# Patient Record
Sex: Female | Born: 1940 | Race: White | Hispanic: No | Smoking: Never smoker
Health system: Southern US, Community
[De-identification: ages and names within clinical notes are randomized; demographics above are authoritative.]

## PROBLEM LIST (undated history)

## (undated) DIAGNOSIS — K219 Gastro-esophageal reflux disease without esophagitis: Secondary | ICD-10-CM

## (undated) DIAGNOSIS — N179 Acute kidney failure, unspecified: Secondary | ICD-10-CM

## (undated) DIAGNOSIS — E785 Hyperlipidemia, unspecified: Secondary | ICD-10-CM

## (undated) DIAGNOSIS — I10 Essential (primary) hypertension: Secondary | ICD-10-CM

## (undated) DIAGNOSIS — E119 Type 2 diabetes mellitus without complications: Secondary | ICD-10-CM

## (undated) HISTORY — DX: Type 2 diabetes mellitus without complications: E11.9

## (undated) HISTORY — DX: Essential (primary) hypertension: I10

## (undated) HISTORY — DX: Gastro-esophageal reflux disease without esophagitis: K21.9

## (undated) HISTORY — DX: Hyperlipidemia, unspecified: E78.5

## (undated) HISTORY — PX: TONSILLECTOMY AND ADENOIDECTOMY: SUR1326

---
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
## 2010-03-05 ENCOUNTER — Inpatient Hospital Stay (HOSPITAL_COMMUNITY): Admission: EM | Admit: 2010-03-05 | Discharge: 2010-03-08 | Payer: Self-pay | Admitting: Emergency Medicine

## 2010-03-29 ENCOUNTER — Ambulatory Visit: Payer: Self-pay | Admitting: Family Medicine

## 2010-04-20 ENCOUNTER — Ambulatory Visit: Payer: Self-pay | Admitting: Internal Medicine

## 2010-04-20 LAB — CONVERTED CEMR LAB
ALT: 19 units/L (ref 0–35)
AST: 13 units/L (ref 0–37)
Albumin: 4.7 g/dL (ref 3.5–5.2)
Alkaline Phosphatase: 106 units/L (ref 39–117)
BUN: 11 mg/dL (ref 6–23)
Basophils Absolute: 0 10*3/uL (ref 0.0–0.1)
Basophils Relative: 1 % (ref 0–1)
CO2: 26 meq/L (ref 19–32)
CRP: 0.2 mg/dL (ref <0.6)
Calcium: 9.7 mg/dL (ref 8.4–10.5)
Chloride: 101 meq/L (ref 96–112)
Creatinine, Ser: 0.59 mg/dL (ref 0.40–1.20)
Eosinophils Absolute: 0.1 10*3/uL (ref 0.0–0.7)
Eosinophils Relative: 1 % (ref 0–5)
Glucose, Bld: 170 mg/dL — ABNORMAL HIGH (ref 70–99)
HCT: 42.7 % (ref 36.0–46.0)
Hemoglobin: 14.3 g/dL (ref 12.0–15.0)
Hgb A1c MFr Bld: 8.3 % — ABNORMAL HIGH (ref <5.7)
Lymphocytes Relative: 36 % (ref 12–46)
Lymphs Abs: 3 10*3/uL (ref 0.7–4.0)
MCHC: 33.5 g/dL (ref 30.0–36.0)
MCV: 87.1 fL (ref 78.0–100.0)
Microalb, Ur: 1.06 mg/dL (ref 0.00–1.89)
Monocytes Absolute: 0.5 10*3/uL (ref 0.1–1.0)
Monocytes Relative: 6 % (ref 3–12)
Neutro Abs: 4.8 10*3/uL (ref 1.7–7.7)
Neutrophils Relative %: 57 % (ref 43–77)
Platelets: 225 10*3/uL (ref 150–400)
Potassium: 4.6 meq/L (ref 3.5–5.3)
RBC: 4.9 M/uL (ref 3.87–5.11)
RDW: 14.3 % (ref 11.5–15.5)
Sodium: 139 meq/L (ref 135–145)
Total Bilirubin: 0.5 mg/dL (ref 0.3–1.2)
Total Protein: 7.1 g/dL (ref 6.0–8.3)
Uric Acid, Serum: 4.1 mg/dL (ref 2.4–7.0)
WBC: 8.5 10*3/uL (ref 4.0–10.5)

## 2010-04-29 ENCOUNTER — Ambulatory Visit (HOSPITAL_COMMUNITY): Admission: RE | Admit: 2010-04-29 | Discharge: 2010-04-29 | Payer: Self-pay | Admitting: Internal Medicine

## 2010-05-04 ENCOUNTER — Encounter: Admission: RE | Admit: 2010-05-04 | Discharge: 2010-05-04 | Payer: Self-pay | Admitting: Internal Medicine

## 2010-05-25 ENCOUNTER — Ambulatory Visit: Payer: Self-pay | Admitting: Internal Medicine

## 2010-07-25 ENCOUNTER — Ambulatory Visit: Payer: Self-pay | Admitting: Internal Medicine

## 2010-07-25 LAB — CONVERTED CEMR LAB
Cholesterol: 241 mg/dL — ABNORMAL HIGH (ref 0–200)
HDL: 59 mg/dL (ref >39)
Hgb A1c MFr Bld: 6.6 % — ABNORMAL HIGH (ref <5.7)
LDL Cholesterol: 151 mg/dL — ABNORMAL HIGH (ref 0–99)
Total CHOL/HDL Ratio: 4.1
Triglycerides: 156 mg/dL — ABNORMAL HIGH (ref <150)
VLDL: 31 mg/dL (ref 0–40)

## 2011-02-22 LAB — CBC
HCT: 35.8 % — ABNORMAL LOW (ref 36.0–46.0)
HCT: 38.6 % (ref 36.0–46.0)
HCT: 45.6 % (ref 36.0–46.0)
Hemoglobin: 12.3 g/dL (ref 12.0–15.0)
Hemoglobin: 13.2 g/dL (ref 12.0–15.0)
Hemoglobin: 15.2 g/dL — ABNORMAL HIGH (ref 12.0–15.0)
MCHC: 33.3 g/dL (ref 30.0–36.0)
MCHC: 34.3 g/dL (ref 30.0–36.0)
MCHC: 34.3 g/dL (ref 30.0–36.0)
MCV: 87.9 fL (ref 78.0–100.0)
MCV: 88.1 fL (ref 78.0–100.0)
MCV: 89.5 fL (ref 78.0–100.0)
Platelets: 141 10*3/uL — ABNORMAL LOW (ref 150–400)
Platelets: 141 10*3/uL — ABNORMAL LOW (ref 150–400)
Platelets: 146 10*3/uL — ABNORMAL LOW (ref 150–400)
RBC: 4.07 MIL/uL (ref 3.87–5.11)
RBC: 4.39 MIL/uL (ref 3.87–5.11)
RBC: 5.09 MIL/uL (ref 3.87–5.11)
RDW: 12.9 % (ref 11.5–15.5)
RDW: 12.9 % (ref 11.5–15.5)
RDW: 13.3 % (ref 11.5–15.5)
WBC: 10.6 10*3/uL — ABNORMAL HIGH (ref 4.0–10.5)
WBC: 11.9 10*3/uL — ABNORMAL HIGH (ref 4.0–10.5)
WBC: 8.4 10*3/uL (ref 4.0–10.5)

## 2011-02-22 LAB — GLUCOSE, CAPILLARY
Glucose-Capillary: 134 mg/dL — ABNORMAL HIGH (ref 70–99)
Glucose-Capillary: 188 mg/dL — ABNORMAL HIGH (ref 70–99)
Glucose-Capillary: 189 mg/dL — ABNORMAL HIGH (ref 70–99)
Glucose-Capillary: 189 mg/dL — ABNORMAL HIGH (ref 70–99)
Glucose-Capillary: 203 mg/dL — ABNORMAL HIGH (ref 70–99)
Glucose-Capillary: 204 mg/dL — ABNORMAL HIGH (ref 70–99)
Glucose-Capillary: 218 mg/dL — ABNORMAL HIGH (ref 70–99)
Glucose-Capillary: 232 mg/dL — ABNORMAL HIGH (ref 70–99)
Glucose-Capillary: 247 mg/dL — ABNORMAL HIGH (ref 70–99)
Glucose-Capillary: 247 mg/dL — ABNORMAL HIGH (ref 70–99)
Glucose-Capillary: 249 mg/dL — ABNORMAL HIGH (ref 70–99)
Glucose-Capillary: 251 mg/dL — ABNORMAL HIGH (ref 70–99)
Glucose-Capillary: 251 mg/dL — ABNORMAL HIGH (ref 70–99)
Glucose-Capillary: 273 mg/dL — ABNORMAL HIGH (ref 70–99)
Glucose-Capillary: 494 mg/dL — ABNORMAL HIGH (ref 70–99)
Glucose-Capillary: 592 mg/dL (ref 70–99)
Glucose-Capillary: 97 mg/dL (ref 70–99)

## 2011-02-22 LAB — URINE MICROSCOPIC-ADD ON

## 2011-02-22 LAB — URINALYSIS, ROUTINE W REFLEX MICROSCOPIC
Bilirubin Urine: NEGATIVE
Glucose, UA: >1000 mg/dL — AB
Ketones, ur: 15 mg/dL — AB
Leukocytes, UA: NEGATIVE
Nitrite: NEGATIVE
Protein, ur: NEGATIVE mg/dL
Specific Gravity, Urine: 1.04 — ABNORMAL HIGH (ref 1.005–1.030)
Urobilinogen, UA: 1 mg/dL (ref 0.0–1.0)
pH: 5.5 (ref 5.0–8.0)

## 2011-02-22 LAB — URINE CULTURE
Colony Count: >=100000
Special Requests: NEGATIVE

## 2011-02-22 LAB — BLOOD GAS, VENOUS
Acid-Base Excess: 0.5 mmol/L (ref 0.0–2.0)
Bicarbonate: 23.7 mEq/L (ref 20.0–24.0)
Drawn by: 317371
O2 Saturation: 98 %
Patient temperature: 98.6
TCO2: 20.3 mmol/L (ref 0–100)
pCO2, Ven: 35.3 mmHg — ABNORMAL LOW (ref 45.0–50.0)
pH, Ven: 7.441 — ABNORMAL HIGH (ref 7.250–7.300)
pO2, Ven: 93.8 mmHg — ABNORMAL HIGH (ref 30.0–45.0)

## 2011-02-22 LAB — BASIC METABOLIC PANEL
BUN: 10 mg/dL (ref 6–23)
BUN: 20 mg/dL (ref 6–23)
BUN: 23 mg/dL (ref 6–23)
BUN: 6 mg/dL (ref 6–23)
CO2: 20 mEq/L (ref 19–32)
CO2: 24 mEq/L (ref 19–32)
CO2: 25 mEq/L (ref 19–32)
CO2: 26 mEq/L (ref 19–32)
Calcium: 7.7 mg/dL — ABNORMAL LOW (ref 8.4–10.5)
Calcium: 7.8 mg/dL — ABNORMAL LOW (ref 8.4–10.5)
Calcium: 8.1 mg/dL — ABNORMAL LOW (ref 8.4–10.5)
Calcium: 8.3 mg/dL — ABNORMAL LOW (ref 8.4–10.5)
Chloride: 105 mEq/L (ref 96–112)
Chloride: 105 mEq/L (ref 96–112)
Chloride: 108 mEq/L (ref 96–112)
Chloride: 95 mEq/L — ABNORMAL LOW (ref 96–112)
Creatinine, Ser: 0.46 mg/dL (ref 0.4–1.2)
Creatinine, Ser: 0.55 mg/dL (ref 0.4–1.2)
Creatinine, Ser: 0.56 mg/dL (ref 0.4–1.2)
Creatinine, Ser: 0.64 mg/dL (ref 0.4–1.2)
GFR calc Af Amer: >60 mL/min (ref >60)
GFR calc Af Amer: >60 mL/min (ref >60)
GFR calc Af Amer: >60 mL/min (ref >60)
GFR calc Af Amer: >60 mL/min (ref >60)
GFR calc non Af Amer: >60 mL/min (ref >60)
GFR calc non Af Amer: >60 mL/min (ref >60)
GFR calc non Af Amer: >60 mL/min (ref >60)
GFR calc non Af Amer: >60 mL/min (ref >60)
Glucose, Bld: 100 mg/dL — ABNORMAL HIGH (ref 70–99)
Glucose, Bld: 208 mg/dL — ABNORMAL HIGH (ref 70–99)
Glucose, Bld: 234 mg/dL — ABNORMAL HIGH (ref 70–99)
Glucose, Bld: 298 mg/dL — ABNORMAL HIGH (ref 70–99)
Potassium: 3.4 mEq/L — ABNORMAL LOW (ref 3.5–5.1)
Potassium: 3.7 mEq/L (ref 3.5–5.1)
Potassium: 4.4 mEq/L (ref 3.5–5.1)
Potassium: 4.5 mEq/L (ref 3.5–5.1)
Sodium: 130 mEq/L — ABNORMAL LOW (ref 135–145)
Sodium: 132 mEq/L — ABNORMAL LOW (ref 135–145)
Sodium: 135 mEq/L (ref 135–145)
Sodium: 135 mEq/L (ref 135–145)

## 2011-02-22 LAB — COMPREHENSIVE METABOLIC PANEL
ALT: 23 U/L (ref 0–35)
AST: 15 U/L (ref 0–37)
Albumin: 2.9 g/dL — ABNORMAL LOW (ref 3.5–5.2)
Alkaline Phosphatase: 135 U/L — ABNORMAL HIGH (ref 39–117)
BUN: 28 mg/dL — ABNORMAL HIGH (ref 6–23)
CO2: 22 mEq/L (ref 19–32)
Calcium: 9 mg/dL (ref 8.4–10.5)
Chloride: 91 mEq/L — ABNORMAL LOW (ref 96–112)
Creatinine, Ser: 0.78 mg/dL (ref 0.4–1.2)
GFR calc Af Amer: >60 mL/min (ref >60)
GFR calc non Af Amer: >60 mL/min (ref >60)
Glucose, Bld: 530 mg/dL — ABNORMAL HIGH (ref 70–99)
Potassium: 3.9 mEq/L (ref 3.5–5.1)
Sodium: 127 mEq/L — ABNORMAL LOW (ref 135–145)
Total Bilirubin: 0.8 mg/dL (ref 0.3–1.2)
Total Protein: 6.7 g/dL (ref 6.0–8.3)

## 2011-02-22 LAB — POCT CARDIAC MARKERS
CKMB, poc: <1 ng/mL — ABNORMAL LOW (ref 1.0–8.0)
Myoglobin, poc: 125 ng/mL (ref 12–200)
Troponin i, poc: <0.05 ng/mL (ref 0.00–0.09)

## 2011-02-22 LAB — DIFFERENTIAL
Basophils Absolute: 0.1 10*3/uL (ref 0.0–0.1)
Basophils Relative: 1 % (ref 0–1)
Eosinophils Absolute: 0 10*3/uL (ref 0.0–0.7)
Eosinophils Relative: 0 % (ref 0–5)
Lymphocytes Relative: 7 % — ABNORMAL LOW (ref 12–46)
Lymphs Abs: 0.9 10*3/uL (ref 0.7–4.0)
Monocytes Absolute: 0.8 10*3/uL (ref 0.1–1.0)
Monocytes Relative: 7 % (ref 3–12)
Neutro Abs: 10.1 10*3/uL — ABNORMAL HIGH (ref 1.7–7.7)
Neutrophils Relative %: 85 % — ABNORMAL HIGH (ref 43–77)

## 2011-02-22 LAB — LIPID PANEL
Cholesterol: 165 mg/dL (ref 0–200)
HDL: 20 mg/dL — ABNORMAL LOW (ref >39)
LDL Cholesterol: 98 mg/dL (ref 0–99)
Total CHOL/HDL Ratio: 8.3 RATIO
Triglycerides: 234 mg/dL — ABNORMAL HIGH (ref <150)
VLDL: 47 mg/dL — ABNORMAL HIGH (ref 0–40)

## 2011-02-22 LAB — HEMOGLOBIN A1C
Hgb A1c MFr Bld: 12.5 % — ABNORMAL HIGH (ref 4.6–6.1)
Mean Plasma Glucose: 312 mg/dL

## 2011-02-22 LAB — LIPASE, BLOOD: Lipase: 23 U/L (ref 11–59)

## 2011-02-22 LAB — KETONES, QUALITATIVE

## 2013-09-08 ENCOUNTER — Encounter: Payer: Self-pay | Admitting: Internal Medicine

## 2013-09-08 ENCOUNTER — Ambulatory Visit (INDEPENDENT_AMBULATORY_CARE_PROVIDER_SITE_OTHER): Payer: Medicare Other | Admitting: Internal Medicine

## 2013-09-08 ENCOUNTER — Other Ambulatory Visit (INDEPENDENT_AMBULATORY_CARE_PROVIDER_SITE_OTHER): Payer: Medicare Other

## 2013-09-08 VITALS — BP 140/78 | HR 74 | Temp 97.4°F | Ht 63.0 in | Wt 177.0 lb

## 2013-09-08 DIAGNOSIS — E785 Hyperlipidemia, unspecified: Secondary | ICD-10-CM

## 2013-09-08 DIAGNOSIS — E118 Type 2 diabetes mellitus with unspecified complications: Secondary | ICD-10-CM | POA: Insufficient documentation

## 2013-09-08 DIAGNOSIS — E119 Type 2 diabetes mellitus without complications: Secondary | ICD-10-CM

## 2013-09-08 DIAGNOSIS — I10 Essential (primary) hypertension: Secondary | ICD-10-CM

## 2013-09-08 LAB — COMPREHENSIVE METABOLIC PANEL
ALT: 12 U/L (ref 0–35)
AST: 15 U/L (ref 0–37)
Albumin: 4 g/dL (ref 3.5–5.2)
Alkaline Phosphatase: 75 U/L (ref 39–117)
BUN: 13 mg/dL (ref 6–23)
CO2: 25 mEq/L (ref 19–32)
Calcium: 9.2 mg/dL (ref 8.4–10.5)
Chloride: 100 mEq/L (ref 96–112)
Creatinine, Ser: 0.9 mg/dL (ref 0.4–1.2)
GFR: 65.62 mL/min (ref >60.00)
Glucose, Bld: 128 mg/dL — ABNORMAL HIGH (ref 70–99)
Potassium: 4.9 mEq/L (ref 3.5–5.1)
Sodium: 131 mEq/L — ABNORMAL LOW (ref 135–145)
Total Bilirubin: 0.3 mg/dL (ref 0.3–1.2)
Total Protein: 6.8 g/dL (ref 6.0–8.3)

## 2013-09-08 LAB — MICROALBUMIN / CREATININE URINE RATIO
Creatinine,U: 22.6 mg/dL
Microalb Creat Ratio: 4.4 mg/g (ref 0.0–30.0)
Microalb, Ur: 1 mg/dL (ref 0.0–1.9)

## 2013-09-08 LAB — CBC
HCT: 37.4 % (ref 36.0–46.0)
Hemoglobin: 12.7 g/dL (ref 12.0–15.0)
MCHC: 34 g/dL (ref 30.0–36.0)
MCV: 84.5 fl (ref 78.0–100.0)
Platelets: 203 10*3/uL (ref 150.0–400.0)
RBC: 4.43 Mil/uL (ref 3.87–5.11)
RDW: 13.6 % (ref 11.5–14.6)
WBC: 6.5 10*3/uL (ref 4.5–10.5)

## 2013-09-08 LAB — LIPID PANEL
Cholesterol: 186 mg/dL (ref 0–200)
HDL: 68.1 mg/dL (ref >39.00)
LDL Cholesterol: 97 mg/dL (ref 0–99)
Total CHOL/HDL Ratio: 3
Triglycerides: 105 mg/dL (ref 0.0–149.0)
VLDL: 21 mg/dL (ref 0.0–40.0)

## 2013-09-08 LAB — HEMOGLOBIN A1C: Hgb A1c MFr Bld: 6.4 % (ref 4.6–6.5)

## 2013-09-08 MED ORDER — GLUCOSE BLOOD VI STRP: ORAL_STRIP | Status: DC

## 2013-09-08 NOTE — Progress Notes (Signed)
HPI  Pt presents to the clinic today to establish care. She is transferring care from the [facility]. She has no concerns today.  Flu: 2014 at [facility] Tetanus: within the last 10 years Pap Smear: 2012 Mammogram: 2012 Colonoscopy: unsure Eye Doctor: as needed Dentist: as needed  History reviewed. No pertinent past medical history.  No current outpatient prescriptions on file.   No current facility-administered medications for this visit.    Not on File  Family History  Problem Relation Age of Onset  . Diabetes Mother   . Hyperlipidemia Mother   . Diabetes Father   . Diabetes Brother   . Diabetes Maternal Grandmother   . Diabetes Paternal Grandmother   . Cancer Neg Hx   . Stroke Neg Hx     History   Social History  . Marital Status: Widowed    Spouse Name: N/A    Number of Children: N/A  . Years of Education: N/A   Occupational History  . Not on file.   Social History Main Topics  . Smoking status: Never Smoker   . Smokeless tobacco: Never Used  . Alcohol Use: No  . Drug Use: No  . Sexual Activity: Yes    Birth Control/ Protection: Post-menopausal   Other Topics Concern  . Not on file   Social History Narrative  . No narrative on file    ROS:  Constitutional: Denies fever, malaise, fatigue, headache or abrupt weight changes.  HEENT: Denies eye pain, eye redness, ear pain, ringing in the ears, wax buildup, runny nose, nasal congestion, bloody nose, or sore throat. Respiratory: Denies difficulty breathing, shortness of breath, cough or sputum production.   Cardiovascular: Denies chest pain, chest tightness, palpitations or swelling in the hands or feet.  Gastrointestinal: Denies abdominal pain, bloating, constipation, diarrhea or blood in the stool.  GU: Denies frequency, urgency, pain with urination, blood in urine, odor or discharge. Musculoskeletal: Denies decrease in range of motion, difficulty with gait, muscle pain or joint pain and  swelling.  Skin: Denies redness, rashes, lesions or ulcercations.  Neurological: Denies dizziness, difficulty with memory, difficulty with speech or problems with balance and coordination.   No other specific complaints in a complete review of systems (except as listed in HPI above).  PE:  BP 140/78  Pulse 74  Temp(Src) 97.4 F (36.3 C) (Oral)  Ht 5\' 3"  (1.6 m)  Wt 177 lb (80.287 kg)  BMI 31.36 kg/m2  SpO2 98% Wt Readings from Last 3 Encounters:  09/08/13 177 lb (80.287 kg)    General: Appears her stated age, well developed, well nourished in NAD. HEENT: Head: normal shape and size; Eyes: sclera white, no icterus, conjunctiva pink, PERRLA and EOMs intact; Ears: Tm's gray and intact, normal light reflex; Nose: mucosa pink and moist, septum midline; Throat/Mouth: Teeth present, mucosa pink and moist, no lesions or ulcerations noted.  Neck: Normal range of motion. Neck supple, trachea midline. No massses, lumps or thyromegaly present.  Cardiovascular: Normal rate and rhythm. S1,S2 noted.  No murmur, rubs or gallops noted. No JVD or BLE edema. No carotid bruits noted. Pulmonary/Chest: Normal effort and positive vesicular breath sounds. No respiratory distress. No wheezes, rales or ronchi noted.  Abdomen: Soft and nontender. Normal bowel sounds, no bruits noted. No distention or masses noted. Liver, spleen and kidneys non palpable. Musculoskeletal: Normal range of motion. No signs of joint swelling. No difficulty with gait.  Neurological: Alert and oriented. Cranial nerves II-XII intact. Coordination normal. +DTRs bilaterally. Psychiatric:  Mood and affect normal. Behavior is normal. Judgment and thought content normal.     BMET    Component Value Date/Time   NA 139 04/20/2010 2032   K 4.6 04/20/2010 2032   CL 101 04/20/2010 2032   CO2 26 04/20/2010 2032   GLUCOSE 170* 04/20/2010 2032   BUN 11 04/20/2010 2032   CREATININE 0.59 04/20/2010 2032   CALCIUM 9.7 04/20/2010 2032   GFRNONAA >60  03/08/2010 0530   GFRAA  Value: >60        The eGFR has been calculated using the MDRD equation. This calculation has not been validated in all clinical situations. eGFR's persistently <60 mL/min signify possible Chronic Kidney Disease. 03/08/2010 0530    Lipid Panel     Component Value Date/Time   CHOL 241* 07/25/2010 1959   TRIG 156* 07/25/2010 1959   HDL 59 07/25/2010 1959   CHOLHDL 4.1 Ratio 07/25/2010 1959   VLDL 31 07/25/2010 1959   LDLCALC 151* 07/25/2010 1959    CBC    Component Value Date/Time   WBC 8.5 04/20/2010 2032   RBC 4.90 04/20/2010 2032   HGB 14.3 04/20/2010 2032   HCT 42.7 04/20/2010 2032   PLT 225 04/20/2010 2032   MCV 87.1 04/20/2010 2032   MCHC 33.5 04/20/2010 2032   RDW 14.3 04/20/2010 2032   LYMPHSABS 3.0 04/20/2010 2032   MONOABS 0.5 04/20/2010 2032   EOSABS 0.1 04/20/2010 2032   BASOSABS 0.0 04/20/2010 2032    Hgb A1C Lab Results  Component Value Date   HGBA1C 6.6* 07/25/2010     Assessment and Plan:  Health Maintenance:  Will have your records faxed over so that we can check your labs and immunization status  RTC in 6 moths or sooner if needed

## 2013-09-08 NOTE — Assessment & Plan Note (Signed)
Pt reports she does not take her cholesterol medication every day Only when she remembers Will recheck lipid profile today

## 2013-09-08 NOTE — Assessment & Plan Note (Signed)
Will check A1C and microalbumin Will refill meds and titrate as needed after labs reviewed

## 2013-09-08 NOTE — Patient Instructions (Signed)
Diabetes and Standards of Medical Care  Diabetes is complicated. You may find that your diabetes team includes a dietitian, nurse, diabetes educator, eye doctor, and more. To help everyone know what is going on and to help you get the care you deserve, the following schedule of care was developed to help keep you on track. Below are the tests, exams, vaccines, medicines, education, and plans you will need. A1c test  Performed at least 2 times a year if you are meeting treatment goals.  Performed 4 times a year if therapy has changed or if you are not meeting treatment goals. Blood pressure test  Performed at every routine medical visit. The goal is less than 120/80 mmHg. Dental exam  Follow up with the dentist regularly. Eye exam  Diagnosed with type 1 diabetes as a child: Get an exam upon reaching the age of 10 years or older and having had diabetes for 3 5 years. Yearly eye exams are recommended after that initial eye exam.  Diagnosed with type 1 diabetes as an adult: Get an exam within 5 years of diagnosis and then yearly.  Diagnosed with type 2 diabetes: Get an exam as soon as possible after the diagnosis and then yearly. Foot care exam  Visual foot exams are performed at every routine medical visit. The exams check for cuts, injuries, or other problems with the feet.  A comprehensive foot exam should be done yearly. This includes visual inspection as well as assessing foot pulses and testing for loss of sensation. Kidney function test (urine microalbumin)  Performed once a year.  Type 1 diabetes: The first test is performed 5 years after diagnosis.  Type 2 diabetes: The first test is performed at the time of diagnosis.  A serum creatinine and estimated glomerular filtration rate (eGFR) test is done once a year to tell the level of chronic kidney disease (CKD), if present. Lipid profile (Cholesterol, HDL, LDL, Triglycerides)  Performed every 5 years for most people.  The  goal for LDL is less than 100 mg/dl. If at high risk, the goal is less than 70 mg/dl.  The goal for HDL is 40 mg/dl 50 mg/dl for men and 50 mg/dl 60 mg/dl for women. An HDL cholesterol of 60 mg/dL or higher gives some protection against heart disease.  The goal for triglycerides is less than 150 mg/dl. Influenza vaccine, pneumococcal vaccine, and hepatitis B vaccine  The influenza vaccine is recommended yearly.  The pneumococcal vaccine is generally given once in a lifetime. However, there are some instances when another vaccination is recommended. Check with your caregiver.  The hepatitis B vaccine is also recommended for adults with diabetes. Diabetes self-management education  Recommended at diagnosis and ongoing as needed. Treatment plan  Reviewed at every medical visit. Document Released: 09/17/2009 Document Revised: 11/06/2012 Document Reviewed: 05/23/2011 [employer] Patient Information 2014 [employer].  

## 2013-09-08 NOTE — Assessment & Plan Note (Signed)
Elevated today Pt reports she does not take her meds everyday, only days that she can remember Will refill meds today

## 2013-09-10 MED ORDER — LISINOPRIL 20 MG PO TABS: 20.0000 mg | ORAL_TABLET | Freq: Every day | ORAL | Status: DC

## 2013-09-10 MED ORDER — METFORMIN HCL 850 MG PO TABS: 850.0000 mg | ORAL_TABLET | Freq: Two times a day (BID) | ORAL | Status: DC

## 2013-09-10 MED ORDER — INSULIN REGULAR HUMAN 100 UNIT/ML IJ SOLN: 2.0000 [IU] | Freq: Three times a day (TID) | INTRAMUSCULAR | Status: DC

## 2013-09-10 MED ORDER — GLUCOSE BLOOD VI STRP: ORAL_STRIP | Status: DC

## 2013-09-10 MED ORDER — PRAVASTATIN SODIUM 40 MG PO TABS: 40.0000 mg | ORAL_TABLET | Freq: Every day | ORAL | Status: DC

## 2013-09-10 MED ORDER — GABAPENTIN 100 MG PO CAPS: 100.0000 mg | ORAL_CAPSULE | Freq: Every day | ORAL | Status: DC

## 2013-09-10 NOTE — Addendum Note (Signed)
Addended by: Darnell Level on: 09/10/2013 11:44 AM   Modules accepted: Orders

## 2013-09-16 ENCOUNTER — Ambulatory Visit: Payer: Self-pay | Admitting: Internal Medicine

## 2014-02-02 ENCOUNTER — Other Ambulatory Visit: Payer: Self-pay

## 2014-02-02 DIAGNOSIS — E119 Type 2 diabetes mellitus without complications: Secondary | ICD-10-CM

## 2014-02-02 MED ORDER — METFORMIN HCL 850 MG PO TABS: 850.0000 mg | ORAL_TABLET | Freq: Two times a day (BID) | ORAL | Status: DC

## 2014-02-02 NOTE — Telephone Encounter (Signed)
Pt left v/m requesting refill metformin, pt is out of med; pt has new pt appt at [facility] 03/02/14.Please advise. Pt request cb today.

## 2014-02-27 ENCOUNTER — Telehealth: Payer: Self-pay

## 2014-03-02 ENCOUNTER — Ambulatory Visit: Payer: Medicare Other | Admitting: Physician Assistant

## 2014-03-02 NOTE — Telephone Encounter (Signed)
error 

## 2014-03-11 ENCOUNTER — Ambulatory Visit: Payer: Medicare Other | Admitting: Physician Assistant

## 2014-03-23 ENCOUNTER — Encounter: Payer: Self-pay | Admitting: Internal Medicine

## 2014-03-23 ENCOUNTER — Ambulatory Visit (INDEPENDENT_AMBULATORY_CARE_PROVIDER_SITE_OTHER): Payer: Medicare Other | Admitting: Internal Medicine

## 2014-03-23 ENCOUNTER — Other Ambulatory Visit (INDEPENDENT_AMBULATORY_CARE_PROVIDER_SITE_OTHER): Payer: Medicare Other

## 2014-03-23 VITALS — BP 168/82 | HR 86 | Temp 97.0°F | Resp 16 | Ht 63.0 in | Wt 174.5 lb

## 2014-03-23 DIAGNOSIS — E119 Type 2 diabetes mellitus without complications: Secondary | ICD-10-CM

## 2014-03-23 DIAGNOSIS — Z0000 Encounter for general adult medical examination without abnormal findings: Secondary | ICD-10-CM | POA: Insufficient documentation

## 2014-03-23 DIAGNOSIS — I10 Essential (primary) hypertension: Secondary | ICD-10-CM

## 2014-03-23 DIAGNOSIS — Z23 Encounter for immunization: Secondary | ICD-10-CM

## 2014-03-23 DIAGNOSIS — E785 Hyperlipidemia, unspecified: Secondary | ICD-10-CM

## 2014-03-23 DIAGNOSIS — Z1231 Encounter for screening mammogram for malignant neoplasm of breast: Secondary | ICD-10-CM

## 2014-03-23 LAB — URINALYSIS, ROUTINE W REFLEX MICROSCOPIC
Bilirubin Urine: NEGATIVE
Ketones, ur: NEGATIVE
Nitrite: POSITIVE — AB
Specific Gravity, Urine: 1.025 (ref 1.000–1.030)
Urine Glucose: NEGATIVE
Urobilinogen, UA: 0.2 (ref 0.0–1.0)
pH: 6 (ref 5.0–8.0)

## 2014-03-23 LAB — LIPID PANEL
Cholesterol: 163 mg/dL (ref 0–200)
HDL: 68.5 mg/dL (ref >39.00)
LDL Cholesterol: 83 mg/dL (ref 0–99)
Total CHOL/HDL Ratio: 2
Triglycerides: 57 mg/dL (ref 0.0–149.0)
VLDL: 11.4 mg/dL (ref 0.0–40.0)

## 2014-03-23 LAB — BASIC METABOLIC PANEL
BUN: 12 mg/dL (ref 6–23)
CO2: 24 mEq/L (ref 19–32)
Calcium: 9.5 mg/dL (ref 8.4–10.5)
Chloride: 104 mEq/L (ref 96–112)
Creatinine, Ser: 0.8 mg/dL (ref 0.4–1.2)
GFR: 73.99 mL/min (ref >60.00)
Glucose, Bld: 142 mg/dL — ABNORMAL HIGH (ref 70–99)
Potassium: 4.7 mEq/L (ref 3.5–5.1)
Sodium: 136 mEq/L (ref 135–145)

## 2014-03-23 LAB — HEMOGLOBIN A1C: Hgb A1c MFr Bld: 5.9 % (ref 4.6–6.5)

## 2014-03-23 LAB — TSH: TSH: 0.91 u[IU]/mL (ref 0.35–5.50)

## 2014-03-23 MED ORDER — GLUCOSE BLOOD VI STRP: ORAL_STRIP | Status: DC

## 2014-03-23 MED ORDER — LISINOPRIL 20 MG PO TABS: 20.0000 mg | ORAL_TABLET | Freq: Every day | ORAL | Status: DC

## 2014-03-23 NOTE — Assessment & Plan Note (Signed)
Her BP is not well controlled She will restart the ACEI I will monitor her lytes and renal function today

## 2014-03-23 NOTE — Progress Notes (Signed)
   Subjective:    Patient ID: Beth Garrison, female    DOB: 06/15/1941, 73 y.o.   MRN: [id number]  Hypertension This is a chronic problem. The current episode started more than 1 year ago. The problem has been gradually worsening since onset. The problem is uncontrolled. Pertinent negatives include no anxiety, blurred vision, chest pain, headaches, malaise/fatigue, neck pain, orthopnea, palpitations, peripheral edema, PND, shortness of breath or sweats. There are no associated agents to hypertension. Past treatments include ACE inhibitors. The current treatment provides moderate improvement. Compliance problems include diet, exercise and psychosocial issues (she has not taken lisinopril for 2 weeks).       Review of Systems  Constitutional: Negative.  Negative for fever, chills, malaise/fatigue, diaphoresis, appetite change and fatigue.  HENT: Negative.   Eyes: Negative.  Negative for blurred vision.  Respiratory: Negative.  Negative for cough, choking, chest tightness, shortness of breath, wheezing and stridor.   Cardiovascular: Negative.  Negative for chest pain, palpitations, orthopnea, leg swelling and PND.  Gastrointestinal: Negative.  Negative for nausea, vomiting, abdominal pain, diarrhea, constipation and blood in stool.  Endocrine: Negative.  Negative for polydipsia, polyphagia and polyuria.  Genitourinary: Negative.   Musculoskeletal: Negative.  Negative for arthralgias, back pain, myalgias, neck pain and neck stiffness.  Skin: Negative.   Allergic/Immunologic: Negative.   Neurological: Negative.  Negative for dizziness, tremors, syncope, light-headedness, numbness and headaches.  Hematological: Negative.  Negative for adenopathy. Does not bruise/bleed easily.  Psychiatric/Behavioral: Negative.        Objective:   Physical Exam  Vitals reviewed. Constitutional: She is oriented to person, place, and time. She appears well-developed and well-nourished. No distress.  HENT:    Head: Normocephalic and atraumatic.  Mouth/Throat: Oropharynx is clear and moist. No oropharyngeal exudate.  Eyes: Conjunctivae are normal. Right eye exhibits no discharge. Left eye exhibits no discharge. No scleral icterus.  Neck: Normal range of motion. Neck supple. No JVD present. No tracheal deviation present. No thyromegaly present.  Cardiovascular: Normal rate, regular rhythm, normal heart sounds and intact distal pulses.  Exam reveals no gallop and no friction rub.   No murmur heard. Pulmonary/Chest: Effort normal and breath sounds normal. No stridor. No respiratory distress. She has no wheezes. She has no rales. She exhibits no tenderness.  Abdominal: Soft. Bowel sounds are normal. She exhibits no distension and no mass. There is no tenderness. There is no rebound and no guarding.  Musculoskeletal: Normal range of motion. She exhibits no edema and no tenderness.  Lymphadenopathy:    She has no cervical adenopathy.  Neurological: She is oriented to person, place, and time.  Skin: Skin is warm and dry. No rash noted. She is not diaphoretic. No erythema. No pallor.  Psychiatric: She has a normal mood and affect. Her behavior is normal. Judgment and thought content normal.     Lab Results  Component Value Date   WBC 6.5 09/08/2013   HGB 12.7 09/08/2013   HCT 37.4 09/08/2013   PLT 203.0 09/08/2013   GLUCOSE 128* 09/08/2013   CHOL 186 09/08/2013   TRIG 105.0 09/08/2013   HDL 68.10 09/08/2013   LDLCALC 97 09/08/2013   ALT 12 09/08/2013   AST 15 09/08/2013   NA 131* 09/08/2013   K 4.9 09/08/2013   CL 100 09/08/2013   CREATININE 0.9 09/08/2013   BUN 13 09/08/2013   CO2 25 09/08/2013   HGBA1C 6.4 09/08/2013   MICROALBUR 1.0 09/08/2013       Assessment & Plan:

## 2014-03-23 NOTE — Assessment & Plan Note (Addendum)

## 2014-03-23 NOTE — Assessment & Plan Note (Signed)
She was referred for an eye exam I will recheck her A1C and will monitor her renal function

## 2014-03-23 NOTE — Progress Notes (Signed)
Pre visit review using our clinic review tool, if applicable. No additional management support is needed unless otherwise documented below in the visit note. 

## 2014-03-23 NOTE — Assessment & Plan Note (Signed)
She is doing well on pravachol 

## 2014-03-23 NOTE — Patient Instructions (Signed)
Hypertension As your heart beats, it forces blood through your arteries. This force is your blood pressure. If the pressure is too high, it is called hypertension (HTN) or high blood pressure. HTN is dangerous because you may have it and not know it. High blood pressure may mean that your heart has to work harder to pump blood. Your arteries may be narrow or stiff. The extra work puts you at risk for heart disease, stroke, and other problems.  Blood pressure consists of two numbers, a higher number over a lower, 110/72, for example. It is stated as "110 over 72." The ideal is below 120 for the top number (systolic) and under 80 for the bottom (diastolic). Write down your blood pressure today. You should pay close attention to your blood pressure if you have certain conditions such as:  Heart failure.  Prior heart attack.  Diabetes  Chronic kidney disease.  Prior stroke.  Multiple risk factors for heart disease. To see if you have HTN, your blood pressure should be measured while you are seated with your arm held at the level of the heart. It should be measured at least twice. A one-time elevated blood pressure reading (especially in the Emergency Department) does not mean that you need treatment. There may be conditions in which the blood pressure is different between your right and left arms. It is important to see your caregiver soon for a recheck. Most people have essential hypertension which means that there is not a specific cause. This type of high blood pressure may be lowered by changing lifestyle factors such as:  Stress.  Smoking.  Lack of exercise.  Excessive weight.  Drug/tobacco/alcohol use.  Eating less salt. Most people do not have symptoms from high blood pressure until it has caused damage to the body. Effective treatment can often prevent, delay or reduce that damage. TREATMENT  When a cause has been identified, treatment for high blood pressure is directed at the  cause. There are a large number of medications to treat HTN. These fall into several categories, and your caregiver will help you select the medicines that are best for you. Medications may have side effects. You should review side effects with your caregiver. If your blood pressure stays high after you have made lifestyle changes or started on medicines,   Your medication(s) may need to be changed.  Other problems may need to be addressed.  Be certain you understand your prescriptions, and know how and when to take your medicine.  Be sure to follow up with your caregiver within the time frame advised (usually within two weeks) to have your blood pressure rechecked and to review your medications.  If you are taking more than one medicine to lower your blood pressure, make sure you know how and at what times they should be taken. Taking two medicines at the same time can result in blood pressure that is too low. SEEK IMMEDIATE MEDICAL CARE IF:  You develop a severe headache, blurred or changing vision, or confusion.  You have unusual weakness or numbness, or a faint feeling.  You have severe chest or abdominal pain, vomiting, or breathing problems. MAKE SURE YOU:   Understand these instructions.  Will watch your condition.  Will get help right away if you are not doing well or get worse. Document Released: 11/20/2005 Document Revised: 02/12/2012 Document Reviewed: 07/10/2008 [facility] Patient Information 2014 [place]. Preventive Care for Adults, Female A healthy lifestyle and preventive care can promote health and wellness.  Preventive health guidelines for women include the following key practices.  A routine yearly physical is a good way to check with your health care provider about your health and preventive screening. It is a chance to share any concerns and updates on your health and to receive a thorough exam.  Visit your dentist for a routine exam and preventive care  every 6 months. Brush your teeth twice a day and floss once a day. Good oral hygiene prevents tooth decay and gum disease.  The frequency of eye exams is based on your age, health, family medical history, use of contact lenses, and other factors. Follow your health care provider's recommendations for frequency of eye exams.  Eat a healthy diet. Foods like vegetables, fruits, whole grains, low-fat dairy products, and lean protein foods contain the nutrients you need without too many calories. Decrease your intake of foods high in solid fats, added sugars, and salt. Eat the right amount of calories for you.Get information about a proper diet from your health care provider, if necessary.  Regular physical exercise is one of the most important things you can do for your health. Most adults should get at least 150 minutes of moderate-intensity exercise (any activity that increases your heart rate and causes you to sweat) each week. In addition, most adults need muscle-strengthening exercises on 2 or more days a week.  Maintain a healthy weight. The body mass index (BMI) is a screening tool to identify possible weight problems. It provides an estimate of body fat based on height and weight. Your health care provider can find your BMI, and can help you achieve or maintain a healthy weight.For adults 20 years and older:  A BMI below 18.5 is considered underweight.  A BMI of 18.5 to 24.9 is normal.  A BMI of 25 to 29.9 is considered overweight.  A BMI of 30 and above is considered obese.  Maintain normal blood lipids and cholesterol levels by exercising and minimizing your intake of saturated fat. Eat a balanced diet with plenty of fruit and vegetables. Blood tests for lipids and cholesterol should begin at age 55 and be repeated every 5 years. If your lipid or cholesterol levels are high, you are over 50, or you are at high risk for heart disease, you may need your cholesterol levels checked more  frequently.Ongoing high lipid and cholesterol levels should be treated with medicines if diet and exercise are not working.  If you smoke, find out from your health care provider how to quit. If you do not use tobacco, do not start.  Lung cancer screening is recommended for adults aged 37 80 years who are at high risk for developing lung cancer because of a history of smoking. A yearly low-dose CT scan of the lungs is recommended for people who have at least a 30-pack-year history of smoking and are a current smoker or have quit within the past 15 years. A pack year of smoking is smoking an average of 1 pack of cigarettes a day for 1 year (for example: 1 pack a day for 30 years or 2 packs a day for 15 years). Yearly screening should continue until the smoker has stopped smoking for at least 15 years. Yearly screening should be stopped for people who develop a health problem that would prevent them from having lung cancer treatment.  If you are pregnant, do not drink alcohol. If you are breastfeeding, be very cautious about drinking alcohol. If you are not pregnant and choose  to drink alcohol, do not have more than 1 drink per day. One drink is considered to be 12 ounces (355 mL) of beer, 5 ounces (148 mL) of wine, or 1.5 ounces (44 mL) of liquor.  Avoid use of street drugs. Do not share needles with anyone. Ask for help if you need support or instructions about stopping the use of drugs.  High blood pressure causes heart disease and increases the risk of stroke. Your blood pressure should be checked at least every 1 to 2 years. Ongoing high blood pressure should be treated with medicines if weight loss and exercise do not work.  If you are 74 72 years old, ask your health care provider if you should take aspirin to prevent strokes.  Diabetes screening involves taking a blood sample to check your fasting blood sugar level. This should be done once every 3 years, after age 56, if you are within normal  weight and without risk factors for diabetes. Testing should be considered at a younger age or be carried out more frequently if you are overweight and have at least 1 risk factor for diabetes.  Breast cancer screening is essential preventive care for women. You should practice "breast self-awareness." This means understanding the normal appearance and feel of your breasts and may include breast self-examination. Any changes detected, no matter how small, should be reported to a health care provider. Women in their 81s and 30s should have a clinical breast exam (CBE) by a health care provider as part of a regular health exam every 1 to 3 years. After age 41, women should have a CBE every year. Starting at age 32, women should consider having a mammogram (breast X-ray test) every year. Women who have a family history of breast cancer should talk to their health care provider about genetic screening. Women at a high risk of breast cancer should talk to their health care providers about having an MRI and a mammogram every year.  Breast cancer gene (BRCA)-related cancer risk assessment is recommended for women who have family members with BRCA-related cancers. BRCA-related cancers include breast, ovarian, tubal, and peritoneal cancers. Having family members with these cancers may be associated with an increased risk for harmful changes (mutations) in the breast cancer genes BRCA1 and BRCA2. Results of the assessment will determine the need for genetic counseling and BRCA1 and BRCA2 testing.  The Pap test is a screening test for cervical cancer. A Pap test can show cell changes on the cervix that might become cervical cancer if left untreated. A Pap test is a procedure in which cells are obtained and examined from the lower end of the uterus (cervix).  Women should have a Pap test starting at age 52.  Between ages 46 and 46, Pap tests should be repeated every 2 years.  Beginning at age 26, you should have a  Pap test every 3 years as long as the past 3 Pap tests have been normal.  Some women have medical problems that increase the chance of getting cervical cancer. Talk to your health care provider about these problems. It is especially important to talk to your health care provider if a new problem develops soon after your last Pap test. In these cases, your health care provider may recommend more frequent screening and Pap tests.  The above recommendations are the same for women who have or have not gotten the vaccine for human papillomavirus (HPV).  If you had a hysterectomy for a problem that was  not cancer or a condition that could lead to cancer, then you no longer need Pap tests. Even if you no longer need a Pap test, a regular exam is a good idea to make sure no other problems are starting.  If you are between ages 86 and 40 years, and you have had normal Pap tests going back 10 years, you no longer need Pap tests. Even if you no longer need a Pap test, a regular exam is a good idea to make sure no other problems are starting.  If you have had past treatment for cervical cancer or a condition that could lead to cancer, you need Pap tests and screening for cancer for at least 20 years after your treatment.  If Pap tests have been discontinued, risk factors (such as a new sexual partner) need to be reassessed to determine if screening should be resumed.  The HPV test is an additional test that may be used for cervical cancer screening. The HPV test looks for the virus that can cause the cell changes on the cervix. The cells collected during the Pap test can be tested for HPV. The HPV test could be used to screen women aged 39 years and older, and should be used in women of any age who have unclear Pap test results. After the age of 12, women should have HPV testing at the same frequency as a Pap test.  Colorectal cancer can be detected and often prevented. Most routine colorectal cancer screening  begins at the age of 32 years and continues through age 26 years. However, your health care provider may recommend screening at an earlier age if you have risk factors for colon cancer. On a yearly basis, your health care provider may provide home test kits to check for hidden blood in the stool. Use of a small camera at the end of a tube, to directly examine the colon (sigmoidoscopy or colonoscopy), can detect the earliest forms of colorectal cancer. Talk to your health care provider about this at age 47, when routine screening begins. Direct exam of the colon should be repeated every 5 10 years through age 95 years, unless early forms of pre-cancerous polyps or small growths are found.  People who are at an increased risk for hepatitis B should be screened for this virus. You are considered at high risk for hepatitis B if:  You were born in a country where hepatitis B occurs often. Talk with your health care provider about which countries are considered high risk.  Your parents were born in a high-risk country and you have not received a shot to protect against hepatitis B (hepatitis B vaccine).  You have HIV or AIDS.  You use needles to inject street drugs.  You live with, or have sex with, someone who has Hepatitis B.  You get hemodialysis treatment.  You take certain medicines for conditions like cancer, organ transplantation, and autoimmune conditions.  Hepatitis C blood testing is recommended for all people born from [birth year] through 1964 and any individual with known risks for hepatitis C.  Practice safe sex. Use condoms and avoid high-risk sexual practices to reduce the spread of sexually transmitted infections (STIs). STIs include gonorrhea, chlamydia, syphilis, trichomonas, herpes, HPV, and human immunodeficiency virus (HIV). Herpes, HIV, and HPV are viral illnesses that have no cure. They can result in disability, cancer, and death. Sexually active women aged 67 years and younger should  be checked for chlamydia. Older women with new or multiple partners  should also be tested for chlamydia. Testing for other STIs is recommended if you are sexually active and at increased risk.  Osteoporosis is a disease in which the bones lose minerals and strength with aging. This can result in serious bone fractures or breaks. The risk of osteoporosis can be identified using a bone density scan. Women ages 65 years and over and women at risk for fractures or osteoporosis should discuss screening with their health care providers. Ask your health care provider whether you should take a calcium supplement or vitamin D to reduce the rate of osteoporosis.  Menopause can be associated with physical symptoms and risks. Hormone replacement therapy is available to decrease symptoms and risks. You should talk to your health care provider about whether hormone replacement therapy is right for you.  Use sunscreen. Apply sunscreen liberally and repeatedly throughout the day. You should seek shade when your shadow is shorter than you. Protect yourself by wearing long sleeves, pants, a wide-brimmed hat, and sunglasses year round, whenever you are outdoors.  Once a month, do a whole body skin exam, using a mirror to look at the skin on your back. Tell your health care provider of new moles, moles that have irregular borders, moles that are larger than a pencil eraser, or moles that have changed in shape or color.  Stay current with required vaccines (immunizations).  Influenza vaccine. All adults should be immunized every year.  Tetanus, diphtheria, and acellular pertussis (Td, Tdap) vaccine. Pregnant women should receive 1 dose of Tdap vaccine during each pregnancy. The dose should be obtained regardless of the length of time since the last dose. Immunization is preferred during the 27th 36th week of gestation. An adult who has not previously received Tdap or who does not know her vaccine status should receive 1  dose of Tdap. This initial dose should be followed by tetanus and diphtheria toxoids (Td) booster doses every 10 years. Adults with an unknown or incomplete history of completing a 3-dose immunization series with Td-containing vaccines should begin or complete a primary immunization series including a Tdap dose. Adults should receive a Td booster every 10 years.  Varicella vaccine. An adult without evidence of immunity to varicella should receive 2 doses or a second dose if she has previously received 1 dose. Pregnant females who do not have evidence of immunity should receive the first dose after pregnancy. This first dose should be obtained before leaving the health care facility. The second dose should be obtained 4 8 weeks after the first dose.  Human papillomavirus (HPV) vaccine. Females aged 15 26 years who have not received the vaccine previously should obtain the 3-dose series. The vaccine is not recommended for use in pregnant females. However, pregnancy testing is not needed before receiving a dose. If a female is found to be pregnant after receiving a dose, no treatment is needed. In that case, the remaining doses should be delayed until after the pregnancy. Immunization is recommended for any person with an immunocompromised condition through the age of 79 years if she did not get any or all doses earlier. During the 3-dose series, the second dose should be obtained 4 8 weeks after the first dose. The third dose should be obtained 24 weeks after the first dose and 16 weeks after the second dose.  Zoster vaccine. One dose is recommended for adults aged 70 years or older unless certain conditions are present.  Measles, mumps, and rubella (MMR) vaccine. Adults born before 25 generally are  considered immune to measles and mumps. Adults born in [birth year] or later should have 1 or more doses of MMR vaccine unless there is a contraindication to the vaccine or there is laboratory evidence of immunity to  each of the three diseases. A routine second dose of MMR vaccine should be obtained at least 28 days after the first dose for students attending postsecondary schools, health care workers, or international travelers. People who received inactivated measles vaccine or an unknown type of measles vaccine during 1963 1967 should receive 2 doses of MMR vaccine. People who received inactivated mumps vaccine or an unknown type of mumps vaccine before 1979 and are at high risk for mumps infection should consider immunization with 2 doses of MMR vaccine. For females of childbearing age, rubella immunity should be determined. If there is no evidence of immunity, females who are not pregnant should be vaccinated. If there is no evidence of immunity, females who are pregnant should delay immunization until after pregnancy. Unvaccinated health care workers born before [birth year] who lack laboratory evidence of measles, mumps, or rubella immunity or laboratory confirmation of disease should consider measles and mumps immunization with 2 doses of MMR vaccine or rubella immunization with 1 dose of MMR vaccine.  Pneumococcal 13-valent conjugate (PCV13) vaccine. When indicated, a person who is uncertain of her immunization history and has no record of immunization should receive the PCV13 vaccine. An adult aged 58 years or older who has certain medical conditions and has not been previously immunized should receive 1 dose of PCV13 vaccine. This PCV13 should be followed with a dose of pneumococcal polysaccharide (PPSV23) vaccine. The PPSV23 vaccine dose should be obtained at least 8 weeks after the dose of PCV13 vaccine. An adult aged 4 years or older who has certain medical conditions and previously received 1 or more doses of PPSV23 vaccine should receive 1 dose of PCV13. The PCV13 vaccine dose should be obtained 1 or more years after the last PPSV23 vaccine dose.  Pneumococcal polysaccharide (PPSV23) vaccine. When PCV13 is also  indicated, PCV13 should be obtained first. All adults aged 32 years and older should be immunized. An adult younger than age 1 years who has certain medical conditions should be immunized. Any person who resides in a nursing home or long-term care facility should be immunized. An adult smoker should be immunized. People with an immunocompromised condition and certain other conditions should receive both PCV13 and PPSV23 vaccines. People with human immunodeficiency virus (HIV) infection should be immunized as soon as possible after diagnosis. Immunization during chemotherapy or radiation therapy should be avoided. Routine use of PPSV23 vaccine is not recommended for American Indians, [place] Natives, or people younger than 65 years unless there are medical conditions that require PPSV23 vaccine. When indicated, people who have unknown immunization and have no record of immunization should receive PPSV23 vaccine. One-time revaccination 5 years after the first dose of PPSV23 is recommended for people aged 85 64 years who have chronic kidney failure, nephrotic syndrome, asplenia, or immunocompromised conditions. People who received 1 2 doses of PPSV23 before age 25 years should receive another dose of PPSV23 vaccine at age 84 years or later if at least 5 years have passed since the previous dose. Doses of PPSV23 are not needed for people immunized with PPSV23 at or after age 61 years.  Meningococcal vaccine. Adults with asplenia or persistent complement component deficiencies should receive 2 doses of quadrivalent meningococcal conjugate (MenACWY-D) vaccine. The doses should be obtained at least 2 months  apart. Microbiologists working with certain meningococcal bacteria, [employer] recruits, people at risk during an outbreak, and people who travel to or live in countries with a high rate of meningitis should be immunized. A first-year college student up through age 29 years who is living in a residence hall should  receive a dose if she did not receive a dose on or after her 16th birthday. Adults who have certain high-risk conditions should receive one or more doses of vaccine.  Hepatitis A vaccine. Adults who wish to be protected from this disease, have certain high-risk conditions, work with hepatitis A-infected animals, work in hepatitis A research labs, or travel to or work in countries with a high rate of hepatitis A should be immunized. Adults who were previously unvaccinated and who anticipate close contact with an international adoptee during the first 60 days after arrival in the [place] from a country with a high rate of hepatitis A should be immunized.  Hepatitis B vaccine. Adults who wish to be protected from this disease, have certain high-risk conditions, may be exposed to blood or other infectious body fluids, are household contacts or sex partners of hepatitis B positive people, are clients or workers in certain care facilities, or travel to or work in countries with a high rate of hepatitis B should be immunized.  Haemophilus influenzae type b (Hib) vaccine. A previously unvaccinated person with asplenia or sickle cell disease or having a scheduled splenectomy should receive 1 dose of Hib vaccine. Regardless of previous immunization, a recipient of a hematopoietic stem cell transplant should receive a 3-dose series 6 12 months after her successful transplant. Hib vaccine is not recommended for adults with HIV infection. Preventive Services / Frequency Ages 98 to 39years  Blood pressure check.** / Every 1 to 2 years.  Lipid and cholesterol check.** / Every 5 years beginning at age 41.  Clinical breast exam.** / Every 3 years for women in their 39s and 3s.  BRCA-related cancer risk assessment.** / For women who have family members with a BRCA-related cancer (breast, ovarian, tubal, or peritoneal cancers).  Pap test.** / Every 2 years from ages 31 through 17. Every 3 years starting at age  72 through age 8 or 49 with a history of 3 consecutive normal Pap tests.  HPV screening.** / Every 3 years from ages 51 through ages 40 to 32 with a history of 3 consecutive normal Pap tests.  Hepatitis C blood test.** / For any individual with known risks for hepatitis C.  Skin self-exam. / Monthly.  Influenza vaccine. / Every year.  Tetanus, diphtheria, and acellular pertussis (Tdap, Td) vaccine.** / Consult your health care provider. Pregnant women should receive 1 dose of Tdap vaccine during each pregnancy. 1 dose of Td every 10 years.  Varicella vaccine.** / Consult your health care provider. Pregnant females who do not have evidence of immunity should receive the first dose after pregnancy.  HPV vaccine. / 3 doses over 6 months, if 43 and younger. The vaccine is not recommended for use in pregnant females. However, pregnancy testing is not needed before receiving a dose.  Measles, mumps, rubella (MMR) vaccine.** / You need at least 1 dose of MMR if you were born in 1957 or later. You may also need a 2nd dose. For females of childbearing age, rubella immunity should be determined. If there is no evidence of immunity, females who are not pregnant should be vaccinated. If there is no evidence of immunity, females who are pregnant  should delay immunization until after pregnancy.  Pneumococcal 13-valent conjugate (PCV13) vaccine.** / Consult your health care provider.  Pneumococcal polysaccharide (PPSV23) vaccine.** / 1 to 2 doses if you smoke cigarettes or if you have certain conditions.  Meningococcal vaccine.** / 1 dose if you are age 74 to 79 years and a Market researcher living in a residence hall, or have one of several medical conditions, you need to get vaccinated against meningococcal disease. You may also need additional booster doses.  Hepatitis A vaccine.** / Consult your health care provider.  Hepatitis B vaccine.** / Consult your health care provider.  Haemophilus  influenzae type b (Hib) vaccine.** / Consult your health care provider. Ages 32 to 64years  Blood pressure check.** / Every 1 to 2 years.  Lipid and cholesterol check.** / Every 5 years beginning at age 64 years.  Lung cancer screening. / Every year if you are aged 94 80 years and have a 30-pack-year history of smoking and currently smoke or have quit within the past 15 years. Yearly screening is stopped once you have quit smoking for at least 15 years or develop a health problem that would prevent you from having lung cancer treatment.  Clinical breast exam.** / Every year after age 79 years.  BRCA-related cancer risk assessment.** / For women who have family members with a BRCA-related cancer (breast, ovarian, tubal, or peritoneal cancers).  Mammogram.** / Every year beginning at age 42 years and continuing for as long as you are in good health. Consult with your health care provider.  Pap test.** / Every 3 years starting at age 30 years through age 64 or 23 years with a history of 3 consecutive normal Pap tests.  HPV screening.** / Every 3 years from ages 2 years through ages 64 to 61 years with a history of 3 consecutive normal Pap tests.  Fecal occult blood test (FOBT) of stool. / Every year beginning at age 54 years and continuing until age 66 years. You may not need to do this test if you get a colonoscopy every 10 years.  Flexible sigmoidoscopy or colonoscopy.** / Every 5 years for a flexible sigmoidoscopy or every 10 years for a colonoscopy beginning at age 59 years and continuing until age 32 years.  Hepatitis C blood test.** / For all people born from [birth year] through 1965 and any individual with known risks for hepatitis C.  Skin self-exam. / Monthly.  Influenza vaccine. / Every year.  Tetanus, diphtheria, and acellular pertussis (Tdap/Td) vaccine.** / Consult your health care provider. Pregnant women should receive 1 dose of Tdap vaccine during each pregnancy. 1 dose of Td  every 10 years.  Varicella vaccine.** / Consult your health care provider. Pregnant females who do not have evidence of immunity should receive the first dose after pregnancy.  Zoster vaccine.** / 1 dose for adults aged 59 years or older.  Measles, mumps, rubella (MMR) vaccine.** / You need at least 1 dose of MMR if you were born in 1957 or later. You may also need a 2nd dose. For females of childbearing age, rubella immunity should be determined. If there is no evidence of immunity, females who are not pregnant should be vaccinated. If there is no evidence of immunity, females who are pregnant should delay immunization until after pregnancy.  Pneumococcal 13-valent conjugate (PCV13) vaccine.** / Consult your health care provider.  Pneumococcal polysaccharide (PPSV23) vaccine.** / 1 to 2 doses if you smoke cigarettes or if you have certain conditions.  Meningococcal  vaccine.** / Consult your health care provider.  Hepatitis A vaccine.** / Consult your health care provider.  Hepatitis B vaccine.** / Consult your health care provider.  Haemophilus influenzae type b (Hib) vaccine.** / Consult your health care provider. Ages 83 years and over  Blood pressure check.** / Every 1 to 2 years.  Lipid and cholesterol check.** / Every 5 years beginning at age 49 years.  Lung cancer screening. / Every year if you are aged 34 80 years and have a 30-pack-year history of smoking and currently smoke or have quit within the past 15 years. Yearly screening is stopped once you have quit smoking for at least 15 years or develop a health problem that would prevent you from having lung cancer treatment.  Clinical breast exam.** / Every year after age 38 years.  BRCA-related cancer risk assessment.** / For women who have family members with a BRCA-related cancer (breast, ovarian, tubal, or peritoneal cancers).  Mammogram.** / Every year beginning at age 46 years and continuing for as long as you are in good  health. Consult with your health care provider.  Pap test.** / Every 3 years starting at age 74 years through age 34 or 64 years with 3 consecutive normal Pap tests. Testing can be stopped between 65 and 70 years with 3 consecutive normal Pap tests and no abnormal Pap or HPV tests in the past 10 years.  HPV screening.** / Every 3 years from ages 66 years through ages 60 or 41 years with a history of 3 consecutive normal Pap tests. Testing can be stopped between 65 and 70 years with 3 consecutive normal Pap tests and no abnormal Pap or HPV tests in the past 10 years.  Fecal occult blood test (FOBT) of stool. / Every year beginning at age 40 years and continuing until age 76 years. You may not need to do this test if you get a colonoscopy every 10 years.  Flexible sigmoidoscopy or colonoscopy.** / Every 5 years for a flexible sigmoidoscopy or every 10 years for a colonoscopy beginning at age 50 years and continuing until age 64 years.  Hepatitis C blood test.** / For all people born from [birth year] through 1965 and any individual with known risks for hepatitis C.  Osteoporosis screening.** / A one-time screening for women ages 50 years and over and women at risk for fractures or osteoporosis.  Skin self-exam. / Monthly.  Influenza vaccine. / Every year.  Tetanus, diphtheria, and acellular pertussis (Tdap/Td) vaccine.** / 1 dose of Td every 10 years.  Varicella vaccine.** / Consult your health care provider.  Zoster vaccine.** / 1 dose for adults aged 29 years or older.  Pneumococcal 13-valent conjugate (PCV13) vaccine.** / Consult your health care provider.  Pneumococcal polysaccharide (PPSV23) vaccine.** / 1 dose for all adults aged 55 years and older.  Meningococcal vaccine.** / Consult your health care provider.  Hepatitis A vaccine.** / Consult your health care provider.  Hepatitis B vaccine.** / Consult your health care provider.  Haemophilus influenzae type b (Hib) vaccine.** /  Consult your health care provider. ** Family history and personal history of risk and conditions may change your health care provider's recommendations. Document Released: 01/16/2002 Document Revised: 09/10/2013 Document Reviewed: 04/17/2011 [facility] Patient Information 2014 [place], [place].

## 2014-03-24 ENCOUNTER — Telehealth: Payer: Self-pay | Admitting: Internal Medicine

## 2014-03-24 ENCOUNTER — Other Ambulatory Visit: Payer: Self-pay | Admitting: *Deleted

## 2014-03-24 DIAGNOSIS — I10 Essential (primary) hypertension: Secondary | ICD-10-CM

## 2014-03-24 DIAGNOSIS — E119 Type 2 diabetes mellitus without complications: Secondary | ICD-10-CM

## 2014-03-24 DIAGNOSIS — E785 Hyperlipidemia, unspecified: Secondary | ICD-10-CM

## 2014-03-24 MED ORDER — METFORMIN HCL 850 MG PO TABS: 850.0000 mg | ORAL_TABLET | Freq: Two times a day (BID) | ORAL | Status: DC

## 2014-03-24 MED ORDER — GABAPENTIN 100 MG PO CAPS: 100.0000 mg | ORAL_CAPSULE | Freq: Every day | ORAL | Status: DC

## 2014-03-24 MED ORDER — PRAVASTATIN SODIUM 40 MG PO TABS: 40.0000 mg | ORAL_TABLET | Freq: Every day | ORAL | Status: DC

## 2014-03-24 MED ORDER — INSULIN REGULAR HUMAN 100 UNIT/ML IJ SOLN: 2.0000 [IU] | Freq: Three times a day (TID) | INTRAMUSCULAR | Status: DC

## 2014-03-24 NOTE — Telephone Encounter (Signed)
Relevant patient education mailed to patient.  

## 2014-03-30 ENCOUNTER — Ambulatory Visit (HOSPITAL_COMMUNITY)
Admission: RE | Admit: 2014-03-30 | Discharge: 2014-03-30 | Disposition: A | Discharge status: Home / Self Care | Payer: Medicare Other | Source: Ambulatory Visit | Attending: Internal Medicine | Admitting: Internal Medicine

## 2014-03-30 DIAGNOSIS — Z1231 Encounter for screening mammogram for malignant neoplasm of breast: Secondary | ICD-10-CM | POA: Insufficient documentation

## 2014-03-31 LAB — HM MAMMOGRAPHY: HM Mammogram: NORMAL

## 2014-04-21 ENCOUNTER — Other Ambulatory Visit: Payer: Self-pay | Admitting: Internal Medicine

## 2014-05-20 LAB — HM COLONOSCOPY

## 2014-06-02 ENCOUNTER — Encounter: Payer: Self-pay | Admitting: Internal Medicine

## 2014-06-18 ENCOUNTER — Telehealth: Payer: Self-pay

## 2014-06-18 NOTE — Telephone Encounter (Signed)
LM for pt to call and schedule a nurse visit to have BP rechecked.

## 2014-06-19 ENCOUNTER — Ambulatory Visit (INDEPENDENT_AMBULATORY_CARE_PROVIDER_SITE_OTHER): Payer: Medicare Other

## 2014-06-19 VITALS — BP 168/82

## 2014-06-19 DIAGNOSIS — I10 Essential (primary) hypertension: Secondary | ICD-10-CM

## 2014-06-19 MED ORDER — GLUCOSE BLOOD VI STRP: ORAL_STRIP | Status: DC

## 2014-06-22 ENCOUNTER — Other Ambulatory Visit: Payer: Self-pay

## 2014-06-22 MED ORDER — GLUCOSE BLOOD VI STRP: ORAL_STRIP | Status: DC

## 2014-08-15 ENCOUNTER — Telehealth: Payer: Self-pay

## 2014-08-15 NOTE — Telephone Encounter (Signed)
LVM for pt to call back.   RE: BP recheck via 5 min. nurse visit 

## 2014-09-25 ENCOUNTER — Other Ambulatory Visit: Payer: Self-pay

## 2014-09-25 MED ORDER — GLUCOSE BLOOD VI STRP: ORAL_STRIP | Status: DC

## 2014-09-28 ENCOUNTER — Other Ambulatory Visit: Payer: Self-pay

## 2014-09-28 MED ORDER — GLUCOSE BLOOD VI STRP: ORAL_STRIP | Status: DC

## 2014-09-29 ENCOUNTER — Other Ambulatory Visit: Payer: Self-pay

## 2014-09-29 MED ORDER — GLUCOSE BLOOD VI STRP: ORAL_STRIP | Status: DC

## 2014-12-28 ENCOUNTER — Other Ambulatory Visit: Payer: Self-pay | Admitting: Internal Medicine

## 2014-12-31 ENCOUNTER — Ambulatory Visit (INDEPENDENT_AMBULATORY_CARE_PROVIDER_SITE_OTHER): Payer: Medicare Other | Admitting: Internal Medicine

## 2014-12-31 ENCOUNTER — Other Ambulatory Visit (INDEPENDENT_AMBULATORY_CARE_PROVIDER_SITE_OTHER): Payer: Medicare Other

## 2014-12-31 ENCOUNTER — Encounter: Payer: Self-pay | Admitting: Internal Medicine

## 2014-12-31 VITALS — BP 118/64 | HR 100 | Temp 97.8°F | Resp 16 | Ht 63.0 in | Wt 169.0 lb

## 2014-12-31 DIAGNOSIS — Z23 Encounter for immunization: Secondary | ICD-10-CM

## 2014-12-31 DIAGNOSIS — E118 Type 2 diabetes mellitus with unspecified complications: Secondary | ICD-10-CM

## 2014-12-31 DIAGNOSIS — I10 Essential (primary) hypertension: Secondary | ICD-10-CM

## 2014-12-31 DIAGNOSIS — E785 Hyperlipidemia, unspecified: Secondary | ICD-10-CM

## 2014-12-31 LAB — MICROALBUMIN / CREATININE URINE RATIO
Creatinine,U: 129.8 mg/dL
Microalb Creat Ratio: 1.9 mg/g (ref 0.0–30.0)
Microalb, Ur: 2.5 mg/dL — ABNORMAL HIGH (ref 0.0–1.9)

## 2014-12-31 LAB — CBC WITH DIFFERENTIAL/PLATELET
Basophils Absolute: 0.1 10*3/uL (ref 0.0–0.1)
Basophils Relative: 0.8 % (ref 0.0–3.0)
Eosinophils Absolute: 0.1 10*3/uL (ref 0.0–0.7)
Eosinophils Relative: 1.8 % (ref 0.0–5.0)
HCT: 34.6 % — ABNORMAL LOW (ref 36.0–46.0)
Hemoglobin: 12 g/dL (ref 12.0–15.0)
Lymphocytes Relative: 24.8 % (ref 12.0–46.0)
Lymphs Abs: 1.6 10*3/uL (ref 0.7–4.0)
MCHC: 34.7 g/dL (ref 30.0–36.0)
MCV: 84.2 fl (ref 78.0–100.0)
Monocytes Absolute: 0.5 10*3/uL (ref 0.1–1.0)
Monocytes Relative: 8.2 % (ref 3.0–12.0)
Neutro Abs: 4.1 10*3/uL (ref 1.4–7.7)
Neutrophils Relative %: 64.4 % (ref 43.0–77.0)
Platelets: 248 10*3/uL (ref 150.0–400.0)
RBC: 4.11 Mil/uL (ref 3.87–5.11)
RDW: 13.9 % (ref 11.5–15.5)
WBC: 6.3 10*3/uL (ref 4.0–10.5)

## 2014-12-31 LAB — URINALYSIS, ROUTINE W REFLEX MICROSCOPIC
Bilirubin Urine: NEGATIVE
Ketones, ur: NEGATIVE
Nitrite: POSITIVE — AB
Specific Gravity, Urine: 1.02 (ref 1.000–1.030)
Urine Glucose: NEGATIVE
Urobilinogen, UA: 0.2 (ref 0.0–1.0)
pH: 6 (ref 5.0–8.0)

## 2014-12-31 LAB — LIPID PANEL
Cholesterol: 206 mg/dL — ABNORMAL HIGH (ref 0–200)
HDL: 61.7 mg/dL (ref >39.00)
LDL Cholesterol: 116 mg/dL — ABNORMAL HIGH (ref 0–99)
NonHDL: 144.3
Total CHOL/HDL Ratio: 3
Triglycerides: 144 mg/dL (ref 0.0–149.0)
VLDL: 28.8 mg/dL (ref 0.0–40.0)

## 2014-12-31 LAB — COMPREHENSIVE METABOLIC PANEL
ALT: 11 U/L (ref 0–35)
AST: 11 U/L (ref 0–37)
Albumin: 4.2 g/dL (ref 3.5–5.2)
Alkaline Phosphatase: 90 U/L (ref 39–117)
BUN: 14 mg/dL (ref 6–23)
CO2: 21 mEq/L (ref 19–32)
Calcium: 9.3 mg/dL (ref 8.4–10.5)
Chloride: 105 mEq/L (ref 96–112)
Creatinine, Ser: 0.93 mg/dL (ref 0.40–1.20)
GFR: 62.95 mL/min (ref >60.00)
Glucose, Bld: 134 mg/dL — ABNORMAL HIGH (ref 70–99)
Potassium: 4.6 mEq/L (ref 3.5–5.1)
Sodium: 138 mEq/L (ref 135–145)
Total Bilirubin: 0.3 mg/dL (ref 0.2–1.2)
Total Protein: 6.8 g/dL (ref 6.0–8.3)

## 2014-12-31 LAB — HEMOGLOBIN A1C: Hgb A1c MFr Bld: 6 % (ref 4.6–6.5)

## 2014-12-31 LAB — TSH: TSH: 0.84 u[IU]/mL (ref 0.35–4.50)

## 2014-12-31 NOTE — Progress Notes (Signed)
Subjective:    Patient ID: Beth Garrison, female    DOB: December 28, 1940, 74 y.o.   MRN: [id number]  Diabetes She presents for her follow-up diabetic visit. She has type 2 diabetes mellitus. Her disease course has been improving. There are no hypoglycemic associated symptoms. Pertinent negatives for hypoglycemia include no dizziness. There are no diabetic associated symptoms. Pertinent negatives for diabetes include no blurred vision, no chest pain, no fatigue, no foot paresthesias, no foot ulcerations, no polydipsia, no polyphagia, no polyuria, no visual change, no weakness and no weight loss. There are no hypoglycemic complications. Symptoms are stable. There are no diabetic complications. Current diabetic treatment includes oral agent (monotherapy). She is compliant with treatment all of the time. Her weight is stable. She is following a generally healthy diet. Meal planning includes avoidance of concentrated sweets. She has not had a previous visit with a dietitian. She participates in exercise intermittently. There is no change in her home blood glucose trend. An ACE inhibitor/angiotensin II receptor blocker is being taken. She does not see a podiatrist.Eye exam is current.      Review of Systems  Constitutional: Negative.  Negative for fever, chills, weight loss, diaphoresis, appetite change and fatigue.  HENT: Negative.   Eyes: Negative.  Negative for blurred vision.  Respiratory: Negative.  Negative for cough, choking, chest tightness, shortness of breath and stridor.   Cardiovascular: Negative.  Negative for chest pain, palpitations and leg swelling.  Gastrointestinal: Negative.  Negative for nausea, vomiting, abdominal pain, diarrhea, constipation and blood in stool.  Endocrine: Negative.  Negative for polydipsia, polyphagia and polyuria.  Genitourinary: Negative.  Negative for dysuria, urgency, frequency, hematuria, flank pain and difficulty urinating.  Musculoskeletal: Negative.   Skin:  Negative.   Allergic/Immunologic: Negative.   Neurological: Negative.  Negative for dizziness and weakness.  Hematological: Negative.  Negative for adenopathy. Does not bruise/bleed easily.  Psychiatric/Behavioral: Negative.        Objective:   Physical Exam  Constitutional: She is oriented to person, place, and time. She appears well-developed and well-nourished. No distress.  HENT:  Head: Normocephalic and atraumatic.  Mouth/Throat: Oropharynx is clear and moist. No oropharyngeal exudate.  Eyes: Conjunctivae are normal. Right eye exhibits no discharge. Left eye exhibits no discharge. No scleral icterus.  Neck: Normal range of motion. Neck supple. No JVD present. No tracheal deviation present. No thyromegaly present.  Cardiovascular: Normal rate, regular rhythm, normal heart sounds and intact distal pulses.  Exam reveals no gallop and no friction rub.   No murmur heard. Pulmonary/Chest: Effort normal and breath sounds normal. No stridor. No respiratory distress. She has no wheezes. She has no rales. She exhibits no tenderness.  Abdominal: Soft. Bowel sounds are normal. She exhibits no distension and no mass. There is no tenderness. There is no rebound and no guarding.  Musculoskeletal: Normal range of motion. She exhibits no edema or tenderness.  Lymphadenopathy:    She has no cervical adenopathy.  Neurological: She is oriented to person, place, and time.  Skin: Skin is warm and dry. No rash noted. She is not diaphoretic. No erythema. No pallor.  Psychiatric: She has a normal mood and affect. Her behavior is normal. Judgment and thought content normal.  Vitals reviewed.     Lab Results  Component Value Date   WBC 6.5 09/08/2013   HGB 12.7 09/08/2013   HCT 37.4 09/08/2013   PLT 203.0 09/08/2013   GLUCOSE 142* 03/23/2014   CHOL 163 03/23/2014   TRIG 57.0 03/23/2014  HDL 68.50 03/23/2014   LDLCALC 83 03/23/2014   ALT 12 09/08/2013   AST 15 09/08/2013   NA 136 03/23/2014    K 4.7 03/23/2014   CL 104 03/23/2014   CREATININE 0.8 03/23/2014   BUN 12 03/23/2014   CO2 24 03/23/2014   TSH 0.91 03/23/2014   HGBA1C 5.9 03/23/2014   MICROALBUR 1.0 09/08/2013      Assessment & Plan:

## 2014-12-31 NOTE — Patient Instructions (Signed)

## 2015-01-01 ENCOUNTER — Encounter: Payer: Self-pay | Admitting: Internal Medicine

## 2015-01-03 ENCOUNTER — Encounter: Payer: Self-pay | Admitting: Internal Medicine

## 2015-01-03 NOTE — Assessment & Plan Note (Signed)
Her blood sugars are well controlled Her renal function is stable She is referred for an eye exam

## 2015-01-03 NOTE — Assessment & Plan Note (Signed)
Her BP is well controlled Lytes and renal function are stable 

## 2015-01-04 ENCOUNTER — Telehealth: Payer: Self-pay | Admitting: Internal Medicine

## 2015-01-04 DIAGNOSIS — I10 Essential (primary) hypertension: Secondary | ICD-10-CM

## 2015-01-04 MED ORDER — LISINOPRIL 20 MG PO TABS: 20.0000 mg | ORAL_TABLET | Freq: Every day | ORAL | Status: DC

## 2015-01-04 MED ORDER — INSULIN REGULAR HUMAN 100 UNIT/ML IJ SOLN: 2.0000 [IU] | Freq: Three times a day (TID) | INTRAMUSCULAR | Status: DC

## 2015-01-04 MED ORDER — PRAVASTATIN SODIUM 40 MG PO TABS: 40.0000 mg | ORAL_TABLET | Freq: Every day | ORAL | Status: DC

## 2015-01-04 MED ORDER — METFORMIN HCL 850 MG PO TABS: 850.0000 mg | ORAL_TABLET | Freq: Two times a day (BID) | ORAL | Status: DC

## 2015-01-04 NOTE — Telephone Encounter (Signed)
RX resent

## 2015-01-04 NOTE — Telephone Encounter (Signed)
Pt called stated that someone was suppose to send in all her med except test strip to be send into [facility] on [address]. Pt state drug store still don't have it and she is out of those med.

## 2015-02-03 LAB — HM DIABETES EYE EXAM

## 2015-02-04 ENCOUNTER — Encounter: Payer: Self-pay | Admitting: Internal Medicine

## 2015-09-20 ENCOUNTER — Other Ambulatory Visit: Payer: Self-pay

## 2015-09-20 MED ORDER — GLUCOSE BLOOD VI STRP: ORAL_STRIP | Status: DC

## 2016-03-20 ENCOUNTER — Other Ambulatory Visit: Payer: Self-pay

## 2016-03-20 MED ORDER — GLUCOSE BLOOD VI STRP: ORAL_STRIP | Status: DC

## 2019-07-07 ENCOUNTER — Inpatient Hospital Stay (HOSPITAL_COMMUNITY): Payer: Medicare Other

## 2019-07-07 ENCOUNTER — Emergency Department (HOSPITAL_COMMUNITY): Payer: Medicare Other

## 2019-07-07 ENCOUNTER — Encounter (HOSPITAL_COMMUNITY): Payer: Self-pay | Admitting: Obstetrics and Gynecology

## 2019-07-07 ENCOUNTER — Inpatient Hospital Stay (HOSPITAL_COMMUNITY)
Admission: EM | Admit: 2019-07-07 | Discharge: 2019-07-17 | DRG: 682 | Disposition: A | Discharge status: Skilled Nursing Facility | Payer: Medicare Other | Attending: Internal Medicine | Admitting: Internal Medicine

## 2019-07-07 ENCOUNTER — Other Ambulatory Visit: Payer: Self-pay

## 2019-07-07 DIAGNOSIS — M6282 Rhabdomyolysis: Secondary | ICD-10-CM | POA: Diagnosis present

## 2019-07-07 DIAGNOSIS — S52601A Unspecified fracture of lower end of right ulna, initial encounter for closed fracture: Secondary | ICD-10-CM | POA: Diagnosis present

## 2019-07-07 DIAGNOSIS — E118 Type 2 diabetes mellitus with unspecified complications: Secondary | ICD-10-CM | POA: Diagnosis present

## 2019-07-07 DIAGNOSIS — E861 Hypovolemia: Secondary | ICD-10-CM | POA: Diagnosis present

## 2019-07-07 DIAGNOSIS — E1122 Type 2 diabetes mellitus with diabetic chronic kidney disease: Secondary | ICD-10-CM | POA: Diagnosis present

## 2019-07-07 DIAGNOSIS — I129 Hypertensive chronic kidney disease with stage 1 through stage 4 chronic kidney disease, or unspecified chronic kidney disease: Secondary | ICD-10-CM | POA: Diagnosis present

## 2019-07-07 DIAGNOSIS — B9620 Unspecified Escherichia coli [E. coli] as the cause of diseases classified elsewhere: Secondary | ICD-10-CM | POA: Diagnosis present

## 2019-07-07 DIAGNOSIS — E876 Hypokalemia: Secondary | ICD-10-CM | POA: Diagnosis present

## 2019-07-07 DIAGNOSIS — K210 Gastro-esophageal reflux disease with esophagitis: Secondary | ICD-10-CM | POA: Diagnosis present

## 2019-07-07 DIAGNOSIS — D509 Iron deficiency anemia, unspecified: Secondary | ICD-10-CM | POA: Diagnosis present

## 2019-07-07 DIAGNOSIS — N19 Unspecified kidney failure: Secondary | ICD-10-CM | POA: Diagnosis present

## 2019-07-07 DIAGNOSIS — G92 Toxic encephalopathy: Secondary | ICD-10-CM | POA: Diagnosis present

## 2019-07-07 DIAGNOSIS — M1990 Unspecified osteoarthritis, unspecified site: Secondary | ICD-10-CM | POA: Diagnosis present

## 2019-07-07 DIAGNOSIS — N170 Acute kidney failure with tubular necrosis: Secondary | ICD-10-CM | POA: Diagnosis present

## 2019-07-07 DIAGNOSIS — R3129 Other microscopic hematuria: Secondary | ICD-10-CM | POA: Diagnosis present

## 2019-07-07 DIAGNOSIS — R0902 Hypoxemia: Secondary | ICD-10-CM | POA: Diagnosis present

## 2019-07-07 DIAGNOSIS — N390 Urinary tract infection, site not specified: Secondary | ICD-10-CM | POA: Diagnosis present

## 2019-07-07 DIAGNOSIS — Z20828 Contact with and (suspected) exposure to other viral communicable diseases: Secondary | ICD-10-CM | POA: Diagnosis present

## 2019-07-07 DIAGNOSIS — D649 Anemia, unspecified: Secondary | ICD-10-CM

## 2019-07-07 DIAGNOSIS — S52501A Unspecified fracture of the lower end of right radius, initial encounter for closed fracture: Secondary | ICD-10-CM | POA: Diagnosis present

## 2019-07-07 DIAGNOSIS — W010XXA Fall on same level from slipping, tripping and stumbling without subsequent striking against object, initial encounter: Secondary | ICD-10-CM | POA: Diagnosis present

## 2019-07-07 DIAGNOSIS — T383X5A Adverse effect of insulin and oral hypoglycemic [antidiabetic] drugs, initial encounter: Secondary | ICD-10-CM | POA: Diagnosis present

## 2019-07-07 DIAGNOSIS — E872 Acidosis: Secondary | ICD-10-CM | POA: Diagnosis present

## 2019-07-07 DIAGNOSIS — Z794 Long term (current) use of insulin: Secondary | ICD-10-CM

## 2019-07-07 DIAGNOSIS — Z79899 Other long term (current) drug therapy: Secondary | ICD-10-CM

## 2019-07-07 DIAGNOSIS — K921 Melena: Secondary | ICD-10-CM | POA: Diagnosis present

## 2019-07-07 DIAGNOSIS — I10 Essential (primary) hypertension: Secondary | ICD-10-CM | POA: Diagnosis not present

## 2019-07-07 DIAGNOSIS — R5381 Other malaise: Secondary | ICD-10-CM | POA: Diagnosis present

## 2019-07-07 DIAGNOSIS — D638 Anemia in other chronic diseases classified elsewhere: Secondary | ICD-10-CM | POA: Diagnosis present

## 2019-07-07 DIAGNOSIS — T39395A Adverse effect of other nonsteroidal anti-inflammatory drugs [NSAID], initial encounter: Secondary | ICD-10-CM | POA: Diagnosis present

## 2019-07-07 DIAGNOSIS — Z833 Family history of diabetes mellitus: Secondary | ICD-10-CM

## 2019-07-07 DIAGNOSIS — K254 Chronic or unspecified gastric ulcer with hemorrhage: Secondary | ICD-10-CM | POA: Diagnosis present

## 2019-07-07 DIAGNOSIS — D62 Acute posthemorrhagic anemia: Secondary | ICD-10-CM | POA: Diagnosis present

## 2019-07-07 DIAGNOSIS — K259 Gastric ulcer, unspecified as acute or chronic, without hemorrhage or perforation: Secondary | ICD-10-CM | POA: Diagnosis present

## 2019-07-07 DIAGNOSIS — E875 Hyperkalemia: Secondary | ICD-10-CM

## 2019-07-07 DIAGNOSIS — R0602 Shortness of breath: Secondary | ICD-10-CM | POA: Diagnosis present

## 2019-07-07 DIAGNOSIS — S62101A Fracture of unspecified carpal bone, right wrist, initial encounter for closed fracture: Secondary | ICD-10-CM

## 2019-07-07 DIAGNOSIS — Y9301 Activity, walking, marching and hiking: Secondary | ICD-10-CM | POA: Diagnosis present

## 2019-07-07 DIAGNOSIS — N183 Chronic kidney disease, stage 3 (moderate): Secondary | ICD-10-CM | POA: Diagnosis present

## 2019-07-07 DIAGNOSIS — N179 Acute kidney failure, unspecified: Secondary | ICD-10-CM | POA: Diagnosis present

## 2019-07-07 DIAGNOSIS — R197 Diarrhea, unspecified: Secondary | ICD-10-CM | POA: Diagnosis present

## 2019-07-07 DIAGNOSIS — K219 Gastro-esophageal reflux disease without esophagitis: Secondary | ICD-10-CM | POA: Diagnosis present

## 2019-07-07 DIAGNOSIS — K269 Duodenal ulcer, unspecified as acute or chronic, without hemorrhage or perforation: Secondary | ICD-10-CM | POA: Diagnosis present

## 2019-07-07 DIAGNOSIS — E785 Hyperlipidemia, unspecified: Secondary | ICD-10-CM | POA: Diagnosis present

## 2019-07-07 DIAGNOSIS — Z8349 Family history of other endocrine, nutritional and metabolic diseases: Secondary | ICD-10-CM

## 2019-07-07 DIAGNOSIS — K264 Chronic or unspecified duodenal ulcer with hemorrhage: Secondary | ICD-10-CM | POA: Diagnosis present

## 2019-07-07 DIAGNOSIS — D520 Dietary folate deficiency anemia: Secondary | ICD-10-CM | POA: Diagnosis not present

## 2019-07-07 DIAGNOSIS — R748 Abnormal levels of other serum enzymes: Secondary | ICD-10-CM | POA: Diagnosis not present

## 2019-07-07 HISTORY — DX: Acute kidney failure, unspecified: N17.9

## 2019-07-07 LAB — CBC WITH DIFFERENTIAL/PLATELET
Abs Immature Granulocytes: 0.18 10*3/uL — ABNORMAL HIGH (ref 0.00–0.07)
Basophils Absolute: 0 10*3/uL (ref 0.0–0.1)
Basophils Relative: 0 %
Eosinophils Absolute: 0 10*3/uL (ref 0.0–0.5)
Eosinophils Relative: 0 %
HCT: 26 % — ABNORMAL LOW (ref 36.0–46.0)
Hemoglobin: 7.8 g/dL — ABNORMAL LOW (ref 12.0–15.0)
Immature Granulocytes: 2 %
Lymphocytes Relative: 10 %
Lymphs Abs: 0.9 10*3/uL (ref 0.7–4.0)
MCH: 29.1 pg (ref 26.0–34.0)
MCHC: 30 g/dL (ref 30.0–36.0)
MCV: 97 fL (ref 80.0–100.0)
Monocytes Absolute: 0.6 10*3/uL (ref 0.1–1.0)
Monocytes Relative: 6 %
Neutro Abs: 7.8 10*3/uL — ABNORMAL HIGH (ref 1.7–7.7)
Neutrophils Relative %: 82 %
Platelets: 235 10*3/uL (ref 150–400)
RBC: 2.68 MIL/uL — ABNORMAL LOW (ref 3.87–5.11)
RDW: 14.2 % (ref 11.5–15.5)
WBC: 9.5 10*3/uL (ref 4.0–10.5)
nRBC: 0.2 % (ref 0.0–0.2)

## 2019-07-07 LAB — RENAL FUNCTION PANEL
Albumin: 3.5 g/dL (ref 3.5–5.0)
Anion gap: 14 (ref 5–15)
BUN: 100 mg/dL — ABNORMAL HIGH (ref 8–23)
CO2: 9 mmol/L — ABNORMAL LOW (ref 22–32)
Calcium: 9 mg/dL (ref 8.9–10.3)
Chloride: 120 mmol/L — ABNORMAL HIGH (ref 98–111)
Creatinine, Ser: 6.07 mg/dL — ABNORMAL HIGH (ref 0.44–1.00)
GFR calc Af Amer: 7 mL/min — ABNORMAL LOW (ref >60)
GFR calc non Af Amer: 6 mL/min — ABNORMAL LOW (ref >60)
Glucose, Bld: 118 mg/dL — ABNORMAL HIGH (ref 70–99)
Phosphorus: 6.7 mg/dL — ABNORMAL HIGH (ref 2.5–4.6)
Potassium: 6.4 mmol/L (ref 3.5–5.1)
Sodium: 143 mmol/L (ref 135–145)

## 2019-07-07 LAB — FERRITIN: Ferritin: 168 ng/mL (ref 11–307)

## 2019-07-07 LAB — COMPREHENSIVE METABOLIC PANEL
ALT: 21 U/L (ref 0–44)
AST: 45 U/L — ABNORMAL HIGH (ref 15–41)
Albumin: 3.5 g/dL (ref 3.5–5.0)
Alkaline Phosphatase: 120 U/L (ref 38–126)
Anion gap: 14 (ref 5–15)
BUN: 116 mg/dL — ABNORMAL HIGH (ref 8–23)
CO2: 9 mmol/L — ABNORMAL LOW (ref 22–32)
Calcium: 8.9 mg/dL (ref 8.9–10.3)
Chloride: 116 mmol/L — ABNORMAL HIGH (ref 98–111)
Creatinine, Ser: 6.67 mg/dL — ABNORMAL HIGH (ref 0.44–1.00)
GFR calc Af Amer: 6 mL/min — ABNORMAL LOW (ref >60)
GFR calc non Af Amer: 6 mL/min — ABNORMAL LOW (ref >60)
Glucose, Bld: 154 mg/dL — ABNORMAL HIGH (ref 70–99)
Potassium: >7.5 mmol/L (ref 3.5–5.1)
Sodium: 139 mmol/L (ref 135–145)
Total Bilirubin: 0.5 mg/dL (ref 0.3–1.2)
Total Protein: 7 g/dL (ref 6.5–8.1)

## 2019-07-07 LAB — URINALYSIS, ROUTINE W REFLEX MICROSCOPIC
Bilirubin Urine: NEGATIVE
Glucose, UA: NEGATIVE mg/dL
Ketones, ur: NEGATIVE mg/dL
Nitrite: NEGATIVE
Protein, ur: 100 mg/dL — AB
Specific Gravity, Urine: 1.012 (ref 1.005–1.030)
pH: 7 (ref 5.0–8.0)

## 2019-07-07 LAB — LACTIC ACID, PLASMA: Lactic Acid, Venous: 0.7 mmol/L (ref 0.5–1.9)

## 2019-07-07 LAB — CBG MONITORING, ED
Glucose-Capillary: 123 mg/dL — ABNORMAL HIGH (ref 70–99)
Glucose-Capillary: 90 mg/dL (ref 70–99)

## 2019-07-07 LAB — VITAMIN B12: Vitamin B-12: 198 pg/mL (ref 180–914)

## 2019-07-07 LAB — IRON AND TIBC
Iron: 62 ug/dL (ref 28–170)
Saturation Ratios: 27 % (ref 10.4–31.8)
TIBC: 229 ug/dL — ABNORMAL LOW (ref 250–450)
UIBC: 167 ug/dL

## 2019-07-07 LAB — SARS CORONAVIRUS 2 BY RT PCR (HOSPITAL ORDER, PERFORMED IN ~~LOC~~ HOSPITAL LAB): SARS Coronavirus 2: NEGATIVE

## 2019-07-07 LAB — RETICULOCYTES
Immature Retic Fract: 18.3 % — ABNORMAL HIGH (ref 2.3–15.9)
RBC.: 2.66 MIL/uL — ABNORMAL LOW (ref 3.87–5.11)
Retic Count, Absolute: 48.7 10*3/uL (ref 19.0–186.0)
Retic Ct Pct: 1.8 % (ref 0.4–3.1)

## 2019-07-07 LAB — FOLATE: Folate: 8.8 ng/mL (ref >5.9)

## 2019-07-07 LAB — POTASSIUM: Potassium: 6.4 mmol/L (ref 3.5–5.1)

## 2019-07-07 LAB — ABO/RH: ABO/RH(D): A POS

## 2019-07-07 LAB — PREPARE RBC (CROSSMATCH)

## 2019-07-07 LAB — PROTIME-INR
INR: 1.4 — ABNORMAL HIGH (ref 0.8–1.2)
Prothrombin Time: 17.4 seconds — ABNORMAL HIGH (ref 11.4–15.2)

## 2019-07-07 LAB — CK: Total CK: 2774 U/L — ABNORMAL HIGH (ref 38–234)

## 2019-07-07 MED ORDER — INSULIN ASPART 100 UNIT/ML IV SOLN
5.0000 [IU] | Freq: Once | INTRAVENOUS | Status: AC
  Administered 2019-07-07: 5 [IU] via INTRAVENOUS
  Filled 2019-07-07: qty 0.05

## 2019-07-07 MED ORDER — PANTOPRAZOLE SODIUM 40 MG IV SOLR
40.0000 mg | Freq: Two times a day (BID) | INTRAVENOUS | Status: DC
  Administered 2019-07-07 – 2019-07-12 (×11): 40 mg via INTRAVENOUS
  Filled 2019-07-07 (×11): qty 40

## 2019-07-07 MED ORDER — ALBUTEROL SULFATE HFA 108 (90 BASE) MCG/ACT IN AERS
2.0000 | INHALATION_SPRAY | Freq: Once | RESPIRATORY_TRACT | Status: AC
  Administered 2019-07-07: 2 via RESPIRATORY_TRACT
  Filled 2019-07-07: qty 6.7

## 2019-07-07 MED ORDER — CALCIUM GLUCONATE-NACL 1-0.675 GM/50ML-% IV SOLN
1.0000 g | Freq: Once | INTRAVENOUS | Status: AC
  Administered 2019-07-07: 1000 mg via INTRAVENOUS
  Filled 2019-07-07: qty 50

## 2019-07-07 MED ORDER — DEXTROSE 50 % IV SOLN
1.0000 | Freq: Once | INTRAVENOUS | Status: AC
  Administered 2019-07-07: 50 mL via INTRAVENOUS

## 2019-07-07 MED ORDER — SODIUM CHLORIDE 0.9 % IV BOLUS
500.0000 mL | Freq: Once | INTRAVENOUS | Status: AC
  Administered 2019-07-07: 500 mL via INTRAVENOUS

## 2019-07-07 MED ORDER — ONDANSETRON HCL 4 MG/2ML IJ SOLN
4.0000 mg | Freq: Four times a day (QID) | INTRAMUSCULAR | Status: DC | PRN
  Administered 2019-07-09 – 2019-07-10 (×2): 4 mg via INTRAVENOUS
  Filled 2019-07-07 (×2): qty 2

## 2019-07-07 MED ORDER — ONDANSETRON HCL 4 MG PO TABS
4.0000 mg | ORAL_TABLET | Freq: Four times a day (QID) | ORAL | Status: DC | PRN
  Administered 2019-07-11 – 2019-07-14 (×2): 4 mg via ORAL
  Filled 2019-07-07 (×2): qty 1

## 2019-07-07 MED ORDER — SODIUM CHLORIDE 0.9% FLUSH: 3.0000 mL | Freq: Once | INTRAVENOUS | Status: DC

## 2019-07-07 MED ORDER — SODIUM CHLORIDE 0.9 % IV BOLUS: 1000.0000 mL | Freq: Once | INTRAVENOUS | Status: DC

## 2019-07-07 MED ORDER — ACETAMINOPHEN 650 MG RE SUPP: 650.0000 mg | Freq: Four times a day (QID) | RECTAL | Status: DC | PRN

## 2019-07-07 MED ORDER — DEXTROSE 50 % IV SOLN
INTRAVENOUS | Status: AC
  Filled 2019-07-07: qty 50

## 2019-07-07 MED ORDER — SODIUM ZIRCONIUM CYCLOSILICATE 10 G PO PACK
10.0000 g | PACK | Freq: Once | ORAL | Status: AC
  Administered 2019-07-07: 10 g via ORAL
  Filled 2019-07-07: qty 1

## 2019-07-07 MED ORDER — FUROSEMIDE 10 MG/ML IJ SOLN
40.0000 mg | Freq: Once | INTRAMUSCULAR | Status: AC
  Administered 2019-07-07: 40 mg via INTRAVENOUS
  Filled 2019-07-07: qty 4

## 2019-07-07 MED ORDER — SODIUM BICARBONATE 8.4 % IV SOLN
INTRAVENOUS | Status: DC
  Administered 2019-07-07 – 2019-07-09 (×4): via INTRAVENOUS
  Filled 2019-07-07 (×5): qty 150

## 2019-07-07 MED ORDER — INSULIN ASPART 100 UNIT/ML ~~LOC~~ SOLN
0.0000 [IU] | Freq: Three times a day (TID) | SUBCUTANEOUS | Status: DC
  Administered 2019-07-08: 07:00:00 1 [IU] via SUBCUTANEOUS
  Administered 2019-07-08 – 2019-07-13 (×7): 2 [IU] via SUBCUTANEOUS
  Administered 2019-07-14: 1 [IU] via SUBCUTANEOUS
  Administered 2019-07-15: 2 [IU] via SUBCUTANEOUS
  Administered 2019-07-16: 1 [IU] via SUBCUTANEOUS
  Filled 2019-07-07: qty 0.09

## 2019-07-07 MED ORDER — SODIUM CHLORIDE 0.9 % IV SOLN
10.0000 mL/h | Freq: Once | INTRAVENOUS | Status: AC
  Administered 2019-07-07: 10 mL/h via INTRAVENOUS

## 2019-07-07 MED ORDER — ACETAMINOPHEN 325 MG PO TABS
650.0000 mg | ORAL_TABLET | Freq: Four times a day (QID) | ORAL | Status: DC | PRN
  Administered 2019-07-08 – 2019-07-09 (×4): 650 mg via ORAL
  Filled 2019-07-07 (×5): qty 2

## 2019-07-07 MED ORDER — SODIUM CHLORIDE 0.9 % IV BOLUS
1000.0000 mL | Freq: Once | INTRAVENOUS | Status: AC
  Administered 2019-07-07: 1000 mL via INTRAVENOUS

## 2019-07-07 NOTE — ED Notes (Addendum)
Date and time results received: 07/07/19 1538   Test: potassium Critical Value: greater than 7.5  Name of Provider Notified: Tegeler MD  Orders Received? Or Actions Taken?: placing orders; at patient bedside at 1548

## 2019-07-07 NOTE — Progress Notes (Signed)
Writer @ bedside for PIV placement. Patient with cast to right arm and multiple previous unsuccessful attempts by ED RNs to right extremity. Writer was unsuccessful x2 attempts with ultrasound. Communicated above to primary care RN.

## 2019-07-07 NOTE — Consult Note (Signed)
Reason for Consult:AKI/CKD stage 3  Referring Physician: Tyrell Antonio, MD  Beth Garrison is an 78 y.o. female.  HPI: Beth Garrison has a PMH significant for DM, CKD stage 3, GERD, HTN, and HLD who presented to [facility] with weakness, diarrhea, shortness of breath, and confusion.  She also had a fall last week and has a fractured left wrist.  Workup in the ED revealed AKI, hyperkalemia, metabolic acidosis, and profound anemia.  We were consulted to further evaluate her AKI/CKD and electrolyte abnormalities.  Of note, her Cr was elevated at 2.92 and hyperkalemia of 5.6 on 06/20/19 at an outside facility, however her lisinopril-hct and metformin were not held.  She is currently confused and unable to give a reliable history.  The current HPI is obtained from discussion with the emergency dept, admitting service, and review of the medical records.  She denied any N/V/D but had diarrhea on admission and was noted to have some at home per her daughter (by phone).  The trend in Scr is seen below.  She denies any NSAIDs, however a note written by her PCP on 06/20/19 noted back pain and stopping these agents.  She also denied any dysuria, pyuria, hematuria, urgency, frequency, or retention.   Trend in Creatinine: Creatinine, Ser  Date/Time Value Ref Range Status  07/07/2019 02:25 PM 6.67 (H) 0.44 - 1.00 mg/dL Corrected  06/20/2019 2.92 (H) 0.57 - 1 mg/dL Final  06/04/2018 1.31 (H) 0.57 - 1 mg/dL Final  01/09/2018 1.16 (H) 0.57 - 1 mg/dL Final  12/31/2014 11:45 AM 0.93 0.40 - 1.20 mg/dL Final  03/23/2014 09:22 AM 0.8 0.4 - 1.2 mg/dL Final  09/08/2013 11:16 AM 0.9 0.4 - 1.2 mg/dL Final  04/20/2010 08:32 PM 0.59 0.40 - 1.20 mg/dL Final  03/08/2010 05:30 AM 0.56 0.4 - 1.2 mg/dL Final  03/07/2010 05:20 AM 0.55 0.4 - 1.2 mg/dL Final  03/06/2010 06:02 AM 0.46 0.4 - 1.2 mg/dL Final  03/05/2010 09:14 PM 0.64 0.4 - 1.2 mg/dL Final  03/05/2010 01:20 PM 0.78 0.4 - 1.2 mg/dL Final    PMH:   Past Medical History:  Diagnosis  Date  . Diabetes mellitus without complication ([place])   . GERD (gastroesophageal reflux disease)   . Hyperlipidemia   . Hypertension     PSH:   Past Surgical History:  Procedure Laterality Date  . TONSILLECTOMY AND ADENOIDECTOMY      Allergies: No Known Allergies  Medications:   Prior to Admission medications   Medication Sig Start Date End Date Taking? Authorizing Provider  cimetidine (TAGAMET) 200 MG tablet Take 200 mg by mouth 2 (two) times daily.   Yes [provider]  gabapentin (NEURONTIN) 100 MG capsule Take 1 capsule (100 mg total) by mouth daily. Patient taking differently: Take 100 mg by mouth 3 (three) times daily.  03/24/14  Yes Janith Lima, MD  glucose blood (ACCU-CHEK COMPACT PLUS) test strip Use BID dx:E11.9 03/20/16  Yes Lendon Colonel, NP  Glycerin-Hypromellose-PEG 400 (DRY EYE RELIEF DROPS OP) Place 1 drop into both eyes daily as needed (dry eyes).   Yes [provider]  lisinopril-hydrochlorothiazide (ZESTORETIC) 20-25 MG tablet Take 1 tablet by mouth daily.   Yes [provider]  metFORMIN (GLUCOPHAGE) 500 MG tablet Take 500 mg by mouth 2 (two) times daily with a meal.   Yes [provider]  rosuvastatin (CRESTOR) 20 MG tablet Take 20 mg by mouth daily.   Yes [provider]  insulin regular (NOVOLIN R,HUMULIN R) 100 units/mL injection  Inject 0.02 mLs (2 Units total) into the skin 3 (three) times daily before meals. Patient not taking: Reported on 07/07/2019 01/04/15   Janith Lima, MD  lisinopril (PRINIVIL,ZESTRIL) 20 MG tablet Take 1 tablet (20 mg total) by mouth daily. Patient not taking: Reported on 07/07/2019 01/04/15   Janith Lima, MD  metFORMIN (GLUCOPHAGE) 850 MG tablet Take 1 tablet (850 mg total) by mouth 2 (two) times daily with a meal. Patient not taking: Reported on 07/07/2019 01/04/15   Janith Lima, MD  pravastatin (PRAVACHOL) 40 MG tablet Take 1 tablet (40 mg total) by mouth daily. Patient not  taking: Reported on 07/07/2019 01/04/15   Janith Lima, MD    Inpatient medications: . pantoprazole (PROTONIX) IV  40 mg Intravenous Q12H  . sodium chloride flush  3 mL Intravenous Once    Discontinued Meds:   Medications Discontinued During This Encounter  Medication Reason  . sodium chloride 0.9 % bolus 1,000 mL     Social History:  reports that she has never smoked. She has never used smokeless tobacco. She reports that she does not drink alcohol or use drugs.  Family History:   Family History  Problem Relation Age of Onset  . Diabetes Mother   . Hyperlipidemia Mother   . Diabetes Father   . Diabetes Brother   . Diabetes Maternal Grandmother   . Diabetes Paternal Grandmother   . Cancer Neg Hx   . Stroke Neg Hx     Pertinent items are noted in HPI. Weight change:   Intake/Output Summary (Last 24 hours) at 07/07/2019 1731 Last data filed at 07/07/2019 1646 Gross per 24 hour  Intake 1550 ml  Output -  Net 1550 ml   BP (!) 116/50   Pulse (!) 106   Temp (!) 96.6 F (35.9 C) (Rectal)   Resp 20   Ht '5\' 5"'  (1.651 m)   Wt 78.9 kg   SpO2 100%   BMI 28.96 kg/m  Vitals:   07/07/19 1427 07/07/19 1513 07/07/19 1530 07/07/19 1615  BP:  (!) 112/55 (!) 131/59 (!) 116/50  Pulse:  98 97 (!) 106  Resp:  '18 20 20  ' Temp:      TempSrc:      SpO2:  100% 100% 100%  Weight: 78.9 kg     Height: '5\' 5"'  (1.651 m)        General appearance: fatigued, mild distress and slowed mentation Head: Normocephalic, without obvious abnormality, atraumatic Resp: clear to auscultation bilaterally Cardio: tachycardic, no rub GI: soft, non-tender; bowel sounds normal; no masses,  no organomegaly Extremities: extremities normal, atraumatic, no cyanosis or edema Neuro: confused but verbal  Labs: Basic Metabolic Panel: Recent Labs  Lab 07/07/19 1425  NA 139  K >7.5*  CL 116*  CO2 9*  GLUCOSE 154*  BUN 116*  CREATININE 6.67*  ALBUMIN 3.5  CALCIUM 8.9   Liver Function Tests: Recent  Labs  Lab 07/07/19 1425  AST 45*  ALT 21  ALKPHOS 120  BILITOT 0.5  PROT 7.0  ALBUMIN 3.5   No results for input(s): LIPASE, AMYLASE in the last 168 hours. No results for input(s): AMMONIA in the last 168 hours. CBC: Recent Labs  Lab 07/07/19 1425  WBC 9.5  NEUTROABS 7.8*  HGB 7.8*  HCT 26.0*  MCV 97.0  PLT 235   PT/INR: '@LABRCNTIP' (inr:5) Cardiac Enzymes: )No results for input(s): CKTOTAL, CKMB, CKMBINDEX, TROPONINI in the last 168 hours. CBG: No results for input(s): GLUCAP in  the last 168 hours.  Iron Studies: No results for input(s): IRON, TIBC, TRANSFERRIN, FERRITIN in the last 168 hours.  Xrays/Other Studies: Dg Wrist Complete Right  Result Date: 07/07/2019 CLINICAL DATA:  Wrist pain secondary to a fall 1 week ago. EXAM: RIGHT WRIST - COMPLETE 3+ VIEW COMPARISON:  None. FINDINGS: There is an impacted fracture metaphysis of the distal right radius. The articular surface of the radius appears to be intact. Minimal lateral angulation. Normal dorsal or volar angulation. There is a fracture through medial aspect of the distal ulna with minimal distraction. Chondrocalcinosis. Arthritic changes at the radial side of the wrist. Slight arthritic changes of the MCP joints. IMPRESSION: Fractures of the distal radius and ulna as described. Electronically Signed   By: Lorriane Shire M.D.   On: 07/07/2019 15:00   Dg Chest Portable 1 View  Result Date: 07/07/2019 CLINICAL DATA:  Weakness, diarrhea and shortness of breath today. EXAM: PORTABLE CHEST 1 VIEW COMPARISON:  PA and lateral chest 07/05/2010. FINDINGS: Lungs are clear. Heart size is normal. Aortic atherosclerosis noted. No pneumothorax or pleural fluid. No acute or focal bony abnormality. IMPRESSION: No acute disease. Atherosclerosis. Electronically Signed   By: Inge Rise M.D.   On: 07/07/2019 14:56     Assessment/Plan: 1.  AKI/CKD stage 3 vs progressive CKD- unclear etiology, presumably due to ischemic ATN in setting  of diarrhea/volume depletion and concomitant ACE-inhibition.  UA with microscopic hematuria, leukocytes but 0-5 RBC's so rhabdo also on DDx.   1. Discontinue ACE and metformin 2. Will also check serologies to r/o acute GN and urine eosinophils to r/o AIN.   3. Renal US pending to r/o obstruction.   4. Will also check SPEP/UPEP in light of AKI and anemia.   5. Hopefully her hyperkalemia can be managed medically and avoid urgent HD. 2. Hyperkalemia - treated with IV Calcium gluconate, insulin/D50, lasix, and lokelma. 1. Repeat renal panel 3. Metabolic acidosis- stop metformin and agree with isotonic bicarb drip at 100 ml/min 4. Anemia- new but no Hgb since 2016.  Not sure if this is new or progressive.  Possibly due to progressive CKD but will also need to r/o multiple myeloma (back pain, ARF, and anemia).   Governor Rooks Alonzo Loving 07/07/2019, 5:31 PM

## 2019-07-07 NOTE — ED Provider Notes (Signed)
Wrist fracture,ortho consulted. Needs admission to [facility], Nephrology will see at [facility]. Already had Lokelma and hyperkalemia treatment.  Physical Exam  BP (!) 116/50   Pulse (!) 106   Temp (!) 96.6 F (35.9 C) (Rectal)   Resp 20   Ht 5\' 5"  (1.651 m)   Wt 78.9 kg   SpO2 100%   BMI 28.96 kg/m   Physical Exam  ED Course/Procedures     Procedures  MDM  Consult: Reviewed with Dr. Jerald Kief for admission to [facility].       Charlesetta Shanks, MD 07/07/19 [phone]

## 2019-07-07 NOTE — ED Notes (Signed)
Beth Householder, MD to bedside to place Central line Sterile technique used Patient aware of procedure being done

## 2019-07-07 NOTE — ED Notes (Addendum)
Date and time results received: 07/07/19 6:07 PM   Test: potassium Critical Value: 6.4  Name of Provider Notified: Regalado MD paged and messaged via amion  Orders Received? Or Actions Taken?: awaiting callback

## 2019-07-07 NOTE — ED Notes (Signed)
Spoke to [facility] bed placement, pt is expected to get a bed once her precautions are discontinued.

## 2019-07-07 NOTE — ED Notes (Signed)
This RN spoke to patient's daughter. This RN had issues attempting to contact daughter as the number was incorrect in chart and patient could not remember the number. After many attempts, RN got in touch with patient's grandson who got this RN the daughter's number.  Daughter made aware of patient status, as well as made aware of delay in information being passed to her.  Pt is confused and had no contacts listed in her cell phone

## 2019-07-07 NOTE — ED Triage Notes (Signed)
Per EMS: Pt is coming from home. Pt reports diarrhea, weakness, and SOB. Pt denies fever.  Pt is alert and oriented and reports dyspnea with exertion.  Pt has a hx of DM.  Pt also recently had a fall last week and hurt her wrist  136/60 HR 110 SR CBG 176 96% on RA RR 20 18 LAC

## 2019-07-07 NOTE — ED Provider Notes (Signed)
Around 9 pm, I was told that patient does not have any access at this point to receive blood.  I tried multiple times to put peripheral IVs including EJ but was unsuccessful.  I discussed with Dr. Jonelle Sidle from hospitalist who asked me to perform a central line.  Central line was performed sterilely and under ultrasound guidance.  It was confirmed by chest x-ray and there is no pneumothorax.   CENTRAL LINE Performed by: Wandra Arthurs Consent: The procedure was performed in an emergent situation. Required items: required blood products, implants, devices, and special equipment available Patient identity confirmed: arm band and provided demographic data Time out: Immediately prior to procedure a "time out" was called to verify the correct patient, procedure, equipment, support staff and site/side marked as required. Indications: vascular access Anesthesia: local infiltration Local anesthetic: lidocaine 1% with epinephrine Anesthetic total: 3 ml Patient sedated: no Preparation: skin prepped with 2% chlorhexidine Skin prep agent dried: skin prep agent completely dried prior to procedure Sterile barriers: all five maximum sterile barriers used - cap, mask, sterile gown, sterile gloves, and large sterile sheet Hand hygiene: hand hygiene performed prior to central venous catheter insertion  Location details: L IJ  Catheter type: triple lumen Catheter size: 8 Fr Pre-procedure: landmarks identified Ultrasound guidance: yes Successful placement: yes Post-procedure: line sutured and dressing applied Assessment: blood return through all parts, free fluid flow, placement verified by x-ray and no pneumothorax on x-ray Patient tolerance: Patient tolerated the procedure well with no immediate complications.    Drenda Freeze, MD 07/07/19 [phone]

## 2019-07-07 NOTE — H&P (Signed)
History and Physical  Beth Garrison ION:[id number] DOB: 1941/03/13 DOA: 07/07/2019  PCP: System, Pcp Not In Patient coming from: Home   I have personally briefly reviewed patient's old medical records in [place]   Chief Complaint: Diarrhea, weakness, SOB.  HPI: Beth Garrison is a 78 y.o. female with past medical history significant for diabetes, GERD, hypertension, chronic kidney disease stage III ( last Cr 1.3)  who presents from home with diarrhea, weakness and shortness of breath.  Patient also report a falling  last week and hurt her wrist.  She has been using a wrist splint at home.  Patient report feeling fatigued and tired for the last week.  She reports some worsening shortness of breath.  She reports worsening shortness of breath on exertion and ambulation. There was  also reports diarrhea, for the last 2 days prior to admission.  Patient is slow to respond questions, she appears mildly confused.  She reports shortness of breath, now improved from oxygen supplementation.  She denies abdominal pain.  She was not aware that she was having diarrhea.  Per nurse patient had an episode of watery stool in the ED.  Brown blackish color.  Patient is not aware of any bright red blood in the stool. She denies chest pain.   Evaluation in the ED: Patient was found to be hypoxic oxygen saturation 80s on room air, she was placed on 2 L of oxygen. Occult blood positive. Patient x-ray of the wrist showed distal radius and ulnar fracture.  Orthopedic hand surgery was called they recommend a right sugar tong splint. Labs: Sodium 139, potassium 7.5, chloride 116, CO2 9, glucose 154, BUN 16, creatinine 6.6, AST 45, ALT 21, hemoglobin 7.8, INR 1.4, PT 17, UA white blood cell 11-20, moderate leukocytes, moderate hemoglobin. Wrist  x-ray:Fractures of the distal radius and ulna as described. Chest x ray; no acute cardiopulmonary disease.  Review of records in care everywhere from [facility],  patient had chemistry profile in July 17 that show a creatinine of 2.9, potassium of 5.6, CO2 of 15.  Prior to that on July 2: Patient creatinine was 1.3, BUN 18, potassium 5.4, CO2 18.  It seems that patient has been taking her metformin, lisinopril and hydrochlorothiazide.  Review of Systems: All systems reviewed and apart from history of presenting illness, are negative.  Past Medical History:  Diagnosis Date  . AKI (acute kidney injury) ([place]) 07/07/2019  . Diabetes mellitus without complication ([place])   . GERD (gastroesophageal reflux disease)   . Hyperlipidemia   . Hypertension    Past Surgical History:  Procedure Laterality Date  . TONSILLECTOMY AND ADENOIDECTOMY     Social History:  reports that she has never smoked. She has never used smokeless tobacco. She reports that she does not drink alcohol or use drugs.   No Known Allergies  Family History  Problem Relation Age of Onset  . Diabetes Mother   . Hyperlipidemia Mother   . Diabetes Father   . Diabetes Brother   . Diabetes Maternal Grandmother   . Diabetes Paternal Grandmother   . Cancer Neg Hx   . Stroke Neg Hx     Prior to Admission medications   Medication Sig Start Date End Date Taking? Authorizing Provider  cimetidine (TAGAMET) 200 MG tablet Take 200 mg by mouth 2 (two) times daily.   Yes [provider]  gabapentin (NEURONTIN) 100 MG capsule Take 1 capsule (100 mg total) by mouth daily. Patient taking differently:  Take 100 mg by mouth 3 (three) times daily.  03/24/14  Yes Janith Lima, MD  glucose blood (ACCU-CHEK COMPACT PLUS) test strip Use BID dx:E11.9 03/20/16  Yes Lendon Colonel, NP  Glycerin-Hypromellose-PEG 400 (DRY EYE RELIEF DROPS OP) Place 1 drop into both eyes daily as needed (dry eyes).   Yes [provider]  lisinopril-hydrochlorothiazide (ZESTORETIC) 20-25 MG tablet Take 1 tablet by mouth daily.   Yes [provider]  metFORMIN (GLUCOPHAGE) 500 MG tablet Take 500 mg  by mouth 2 (two) times daily with a meal.   Yes [provider]  rosuvastatin (CRESTOR) 20 MG tablet Take 20 mg by mouth daily.   Yes [provider]  insulin regular (NOVOLIN R,HUMULIN R) 100 units/mL injection Inject 0.02 mLs (2 Units total) into the skin 3 (three) times daily before meals. Patient not taking: Reported on 07/07/2019 01/04/15   Janith Lima, MD  lisinopril (PRINIVIL,ZESTRIL) 20 MG tablet Take 1 tablet (20 mg total) by mouth daily. Patient not taking: Reported on 07/07/2019 01/04/15   Janith Lima, MD  metFORMIN (GLUCOPHAGE) 850 MG tablet Take 1 tablet (850 mg total) by mouth 2 (two) times daily with a meal. Patient not taking: Reported on 07/07/2019 01/04/15   Janith Lima, MD  pravastatin (PRAVACHOL) 40 MG tablet Take 1 tablet (40 mg total) by mouth daily. Patient not taking: Reported on 07/07/2019 01/04/15   Janith Lima, MD   Physical Exam: Vitals:   07/07/19 1427 07/07/19 1513 07/07/19 1530 07/07/19 1615  BP:  (!) 112/55 (!) 131/59 (!) 116/50  Pulse:  98 97 (!) 106  Resp:  '18 20 20  ' Temp:      TempSrc:      SpO2:  100% 100% 100%  Weight: 78.9 kg     Height: '5\' 5"'  (1.651 m)        General exam: Moderately built and nourished patient, lying comfortably supine on the gurney in no obvious distress.  Flat affect, slow to respond question.  Head, eyes and ENT: Nontraumatic and normocephalic. Pupils equally reacting to light and accommodation. Oral mucosa moist.  Neck: Supple. No JVD, carotid bruit or thyromegaly.  Lymphatics: No lymphadenopathy.  Respiratory system: Clear to auscultation. No increased work of breathing.  Cardiovascular system: S1 and S2 heard, RRR. No JVD, murmurs, gallops, clicks or pedal edema.  Gastrointestinal system: Abdomen is nondistended, soft and nontender. Normal bowel sounds heard. No organomegaly or masses appreciated.  Central nervous system: Flat affect, slow to respond to questions, moves all 4 extremities   Extremities: Symmetric 5 x 5 power. Peripheral pulses symmetrically felt.   Skin: No rashes or acute findings.  Musculoskeletal system: Negative exam.  Psychiatry: Pleasant and cooperative.   Labs on Admission:  Basic Metabolic Panel: Recent Labs  Lab 07/07/19 1425  NA 139  K >7.5*  CL 116*  CO2 9*  GLUCOSE 154*  BUN 116*  CREATININE 6.67*  CALCIUM 8.9   Liver Function Tests: Recent Labs  Lab 07/07/19 1425  AST 45*  ALT 21  ALKPHOS 120  BILITOT 0.5  PROT 7.0  ALBUMIN 3.5   No results for input(s): LIPASE, AMYLASE in the last 168 hours. No results for input(s): AMMONIA in the last 168 hours. CBC: Recent Labs  Lab 07/07/19 1425  WBC 9.5  NEUTROABS 7.8*  HGB 7.8*  HCT 26.0*  MCV 97.0  PLT 235   Cardiac Enzymes: No results for input(s): CKTOTAL, CKMB, CKMBINDEX, TROPONINI in the last 168  hours.  BNP (last 3 results) No results for input(s): PROBNP in the last 8760 hours. CBG: No results for input(s): GLUCAP in the last 168 hours.  Radiological Exams on Admission: Dg Wrist Complete Right  Result Date: 07/07/2019 CLINICAL DATA:  Wrist pain secondary to a fall 1 week ago. EXAM: RIGHT WRIST - COMPLETE 3+ VIEW COMPARISON:  None. FINDINGS: There is an impacted fracture metaphysis of the distal right radius. The articular surface of the radius appears to be intact. Minimal lateral angulation. Normal dorsal or volar angulation. There is a fracture through medial aspect of the distal ulna with minimal distraction. Chondrocalcinosis. Arthritic changes at the radial side of the wrist. Slight arthritic changes of the MCP joints. IMPRESSION: Fractures of the distal radius and ulna as described. Electronically Signed   By: Lorriane Shire M.D.   On: 07/07/2019 15:00   Dg Chest Portable 1 View  Result Date: 07/07/2019 CLINICAL DATA:  Weakness, diarrhea and shortness of breath today. EXAM: PORTABLE CHEST 1 VIEW COMPARISON:  PA and lateral chest 07/05/2010. FINDINGS: Lungs are  clear. Heart size is normal. Aortic atherosclerosis noted. No pneumothorax or pleural fluid. No acute or focal bony abnormality. IMPRESSION: No acute disease. Atherosclerosis. Electronically Signed   By: Inge Rise M.D.   On: 07/07/2019 14:56    EKG: Independently reviewed. Tachycardia.  Assessment/Plan Principal Problem:   Hyperkalemia Active Problems:   Type II diabetes mellitus with manifestations ([facility])   Hyperlipidemia with target LDL less than 100   HTN (hypertension)   Renal failure   AKI (acute kidney injury) ([place])   Anemia   Melena   1-AKI on chronic kidney disease a stage III:  Per records patient creatinine on July 2 was 1.3 subsequently on July 17 was 2.9. Renal failure in the setting of metformin, ACE inhibitor, diuretic, poor oral intake and hypovolemia. Patient will be admitted to the stepdown units. Discussed with Dr. Genevie Ann will start IV bicarb drip. Repeat be met later tonight, to follow potassium level. Patient to be transferred to [facility] in case that she require hemodialysis. Monitor strict I's and O's. Check renal ultrasound.  2-Hyperkalemia: In the setting of AKI. Patient received in the ED albuterol, calcium gluconate, IV Lasix, insulin and dextrose, Lokelma. Repeat B-met  Tonight.  3-Anemia:  Could be multifactorial anemia of chronic disease and component of acute GI bleed. 1 unit of packed red blood cell already ordered. Check anemia panel. GI has been consulted. Start IV Protonix.   4-Diarrhea, ? Melena:  Occult blood positive.  Episode of brown, blackish stool in the ED.   GI consulted.  5-Diabetes; type II: Hold metformin.  Will probably need to be hold at discharge as well. Will order sliding scale insulin.  6-Wrist fracture;  She had splint. Fractures of the distal radius and ulna as described.  7-Acute Encephalopathy;  Alert, slowly answering questions. Mildly confuse.  Likely related to acute illness, renal  failure.   DVT Prophylaxis: SCD Code Status: Full code Family Communication: Daughter over phone.  Disposition Plan: Admit to [facility] for treatment of acute renal failure, hyperkalemia, work-up for GI bleed.    Time spent: 75 minutes.   Elmarie Shiley MD [facility]   07/07/2019, 5:52 PM

## 2019-07-07 NOTE — ED Provider Notes (Signed)
[facility] DEPT Provider Note   CSN: [id number] Arrival date & time: 07/07/19  1339     History   Chief Complaint Chief Complaint  Patient presents with   Weakness   Shortness of Breath   Diarrhea    HPI Beth Garrison is a 78 y.o. female.     The history is provided by the patient and medical records. No language interpreter was used.  Illness Quality:  Fatigue Severity:  Severe Onset quality:  Gradual Duration:  3 days Timing:  Constant Progression:  Worsening Chronicity:  New Associated symptoms: diarrhea, fatigue and shortness of breath   Associated symptoms: no abdominal pain, no chest pain, no congestion, no cough, no fever, no headaches, no loss of consciousness, no nausea, no rash, no rhinorrhea, no sore throat, no vomiting and no wheezing     Past Medical History:  Diagnosis Date   Diabetes mellitus without complication    GERD (gastroesophageal reflux disease)    Hyperlipidemia    Hypertension     Patient Active Problem List   Diagnosis Date Noted   Routine general medical examination at a health care facility 03/23/2014   Other screening mammogram 03/23/2014   Type II diabetes mellitus with manifestations ([place]) 09/08/2013   Hyperlipidemia with target LDL less than 100 09/08/2013   HTN (hypertension) 09/08/2013    Past Surgical History:  Procedure Laterality Date   TONSILLECTOMY AND ADENOIDECTOMY       OB History   No obstetric history on file.      Home Medications    Prior to Admission medications   Medication Sig Start Date End Date Taking? Authorizing Provider  gabapentin (NEURONTIN) 100 MG capsule Take 1 capsule (100 mg total) by mouth daily. 03/24/14   Janith Lima, MD  glucose blood (ACCU-CHEK COMPACT PLUS) test strip Use BID dx:E11.9 03/20/16   Lendon Colonel, NP  insulin regular (NOVOLIN R,HUMULIN R) 100 units/mL injection Inject 0.02 mLs (2 Units total) into the skin 3 (three) times  daily before meals. 01/04/15   Janith Lima, MD  lisinopril (PRINIVIL,ZESTRIL) 20 MG tablet Take 1 tablet (20 mg total) by mouth daily. 01/04/15   Janith Lima, MD  metFORMIN (GLUCOPHAGE) 850 MG tablet Take 1 tablet (850 mg total) by mouth 2 (two) times daily with a meal. 01/04/15   Janith Lima, MD  pravastatin (PRAVACHOL) 40 MG tablet Take 1 tablet (40 mg total) by mouth daily. 01/04/15   Janith Lima, MD    Family History Family History  Problem Relation Age of Onset   Diabetes Mother    Hyperlipidemia Mother    Diabetes Father    Diabetes Brother    Diabetes Maternal Grandmother    Diabetes Paternal Grandmother    Cancer Neg Hx    Stroke Neg Hx     Social History Social History   Tobacco Use   Smoking status: Never Smoker   Smokeless tobacco: Never Used  Substance Use Topics   Alcohol use: No   Drug use: No     Allergies   Patient has no allergy information on record.   Review of Systems Review of Systems  Constitutional: Positive for fatigue. Negative for chills, diaphoresis and fever.  HENT: Negative for congestion, rhinorrhea and sore throat.   Eyes: Negative for visual disturbance.  Respiratory: Positive for shortness of breath. Negative for cough, choking, chest tightness and wheezing.   Cardiovascular: Negative for chest pain, palpitations and leg swelling.  Gastrointestinal:  Positive for diarrhea. Negative for abdominal distention, abdominal pain, constipation, nausea and vomiting.  Genitourinary: Negative for flank pain.  Musculoskeletal: Negative for back pain, neck pain and neck stiffness.  Skin: Negative for rash and wound.  Neurological: Negative for dizziness, loss of consciousness, weakness, light-headedness, numbness and headaches.  Psychiatric/Behavioral: Negative for agitation.  All other systems reviewed and are negative.    Physical Exam Updated Vital Signs BP (!) 116/50    Pulse (!) 106    Temp (!) 96.6 F (35.9 C)  (Rectal)    Resp 20    Ht 5\' 5"  (1.651 m)    Wt 78.9 kg    SpO2 100%    BMI 28.96 kg/m   Physical Exam Vitals signs and nursing note reviewed.  Constitutional:      General: She is not in acute distress.    Appearance: She is well-developed. She is not ill-appearing, toxic-appearing or diaphoretic.  HENT:     Head: Normocephalic and atraumatic.  Eyes:     Extraocular Movements: Extraocular movements intact.     Conjunctiva/sclera: Conjunctivae normal.     Pupils: Pupils are equal, round, and reactive to light.  Neck:     Musculoskeletal: Normal range of motion and neck supple.  Cardiovascular:     Rate and Rhythm: Regular rhythm. Tachycardia present.     Heart sounds: No murmur.  Pulmonary:     Effort: Pulmonary effort is normal. Tachypnea present. No respiratory distress.     Breath sounds: Normal breath sounds. No decreased breath sounds, wheezing, rhonchi or rales.  Chest:     Chest wall: No tenderness.  Abdominal:     Palpations: Abdomen is soft.     Tenderness: There is no abdominal tenderness.  Musculoskeletal:     Right wrist: She exhibits tenderness. She exhibits no deformity and no laceration.     Right lower leg: She exhibits no tenderness. No edema.     Left lower leg: She exhibits no tenderness. No edema.     Comments: Tenderness in her right wrist.  Normal grip strength, sensation, and pulses.  No laceration seen.  No tenderness in the elbow or shoulder.  Skin:    General: Skin is warm and dry.     Capillary Refill: Capillary refill takes less than 2 seconds.     Coloration: Skin is pale.  Neurological:     General: No focal deficit present.     Mental Status: She is alert and oriented to person, place, and time.  Psychiatric:        Mood and Affect: Mood normal.      ED Treatments / Results  Labs (all labs ordered are listed, but only abnormal results are displayed) Labs Reviewed  URINALYSIS, ROUTINE W REFLEX MICROSCOPIC - Abnormal; Notable for the  following components:      Result Value   APPearance CLOUDY (*)    Hgb urine dipstick MODERATE (*)    Protein, ur 100 (*)    Leukocytes,Ua MODERATE (*)    Bacteria, UA MANY (*)    All other components within normal limits  COMPREHENSIVE METABOLIC PANEL - Abnormal; Notable for the following components:   Potassium >7.5 (*)    Chloride 116 (*)    CO2 9 (*)    Glucose, Bld 154 (*)    BUN 116 (*)    Creatinine, Ser 6.67 (*)    AST 45 (*)    GFR calc non Af Amer 6 (*)    GFR calc  Af Amer 6 (*)    All other components within normal limits  CBC WITH DIFFERENTIAL/PLATELET - Abnormal; Notable for the following components:   RBC 2.68 (*)    Hemoglobin 7.8 (*)    HCT 26.0 (*)    Neutro Abs 7.8 (*)    Abs Immature Granulocytes 0.18 (*)    All other components within normal limits  PROTIME-INR - Abnormal; Notable for the following components:   Prothrombin Time 17.4 (*)    INR 1.4 (*)    All other components within normal limits  SARS CORONAVIRUS 2 (HOSPITAL ORDER, [facility])  CULTURE, BLOOD (ROUTINE X 2)  CULTURE, BLOOD (ROUTINE X 2)  URINE CULTURE  LACTIC ACID, PLASMA  POTASSIUM  POC OCCULT BLOOD, ED  TYPE AND SCREEN  PREPARE RBC (CROSSMATCH)    EKG EKG Interpretation  Date/Time:  Monday July 07 2019 13:52:36 EDT Ventricular Rate:  100 PR Interval:    QRS Duration: 81 QT Interval:  317 QTC Calculation: 409 R Axis:   83 Text Interpretation:  Sinus tachycardia Borderline right axis deviation Low voltage, extremity and precordial leads When compared to prior, less artrifact.  No STEMI Confirmed by Antony Blackbird [phone]) on 07/07/2019 2:08:30 PM   Radiology Dg Wrist Complete Right  Result Date: 07/07/2019 CLINICAL DATA:  Wrist pain secondary to a fall 1 week ago. EXAM: RIGHT WRIST - COMPLETE 3+ VIEW COMPARISON:  None. FINDINGS: There is an impacted fracture metaphysis of the distal right radius. The articular surface of the radius appears to be  intact. Minimal lateral angulation. Normal dorsal or volar angulation. There is a fracture through medial aspect of the distal ulna with minimal distraction. Chondrocalcinosis. Arthritic changes at the radial side of the wrist. Slight arthritic changes of the MCP joints. IMPRESSION: Fractures of the distal radius and ulna as described. Electronically Signed   By: Lorriane Shire M.D.   On: 07/07/2019 15:00   Dg Chest Portable 1 View  Result Date: 07/07/2019 CLINICAL DATA:  Weakness, diarrhea and shortness of breath today. EXAM: PORTABLE CHEST 1 VIEW COMPARISON:  PA and lateral chest 07/05/2010. FINDINGS: Lungs are clear. Heart size is normal. Aortic atherosclerosis noted. No pneumothorax or pleural fluid. No acute or focal bony abnormality. IMPRESSION: No acute disease. Atherosclerosis. Electronically Signed   By: Inge Rise M.D.   On: 07/07/2019 14:56    Procedures Procedures (including critical care time)  CRITICAL CARE Performed by: Gwenyth Allegra Indi Willhite Total critical care time: 55 minutes Critical care time was exclusive of separately billable procedures and treating other patients. Critical care was necessary to treat or prevent imminent or life-threatening deterioration. Critical care was time spent personally by me on the following activities: development of treatment plan with patient and/or surrogate as well as nursing, discussions with consultants, evaluation of patient's response to treatment, examination of patient, obtaining history from patient or surrogate, ordering and performing treatments and interventions, ordering and review of laboratory studies, ordering and review of radiographic studies, pulse oximetry and re-evaluation of patient's condition.   Medications Ordered in ED Medications  sodium chloride flush (NS) 0.9 % injection 3 mL (3 mLs Intravenous Refused 07/07/19 1642)  albuterol (VENTOLIN HFA) 108 (90 Base) MCG/ACT inhaler 2 puff (has no administration in time  range)  dextrose 50 % solution (has no administration in time range)  furosemide (LASIX) injection 40 mg (has no administration in time range)  sodium chloride 0.9 % bolus 500 mL (0 mLs Intravenous Stopped 07/07/19 1643)  0.9 %  sodium chloride infusion (10 mL/hr Intravenous New Bag/Given 07/07/19 1645)  calcium gluconate 1 g/ 50 mL sodium chloride IVPB (0 g Intravenous Stopped 07/07/19 1640)  insulin aspart (novoLOG) injection 5 Units (5 Units Intravenous Given 07/07/19 1604)    And  dextrose 50 % solution 50 mL (50 mLs Intravenous Given 07/07/19 1603)  sodium chloride 0.9 % bolus 1,000 mL (0 mLs Intravenous Stopped 07/07/19 1646)  sodium zirconium cyclosilicate (LOKELMA) packet 10 g (10 g Oral Given 07/07/19 1642)     Initial Impression / Assessment and Plan / ED Course  I have reviewed the triage vital signs and the nursing notes.  Pertinent labs & imaging results that were available during my care of the patient were reviewed by me and considered in my medical decision making (see chart for details).        Beth Garrison is a 78 y.o. female with a past medical history significant for hypertension, hyperlipidemia, GERD, and diabetes who presents for fatigue, lightheadedness, shortness of breath, diarrhea, and right wrist pain after a fall.  Patient reports that last week she had a fall hurting her right wrist.  She had a wrist splint at home and has been using this but it still hurts.  She denies any other injuries from the fall.  She reports that for the last few days she has had worsening fatigue and felt very tired.  She reports is also had some shortness of breath that has been worsening.  She denies any chest pain or chest pressure but her shortness breath is worsened with exertion and ambulation.  She denies any new leg pain or leg swelling.  She reports the diarrhea for the last 2 days.  She denies any focal weakness but reports feeling generalized fatigue.  No fevers or chills.  No congestion or  cough.  No known coronavirus contacts.  No chest pain.  On exam, lungs are clear and chest is nontender.  Abdomen is nontender.  Patient has tenderness in her right wrist with no tenderness in the anatomic snuffbox.  Good grip strength bilaterally with normal sensation in the fingers.  Good radial pulse.  No laceration seen on the wrist or hand.  Abdomen nontender, back nontender.  Fecal occult was positive with nursing chaperone.  Legs not significant edematous.  Oxygen saturations were in the 80s and she was placed on oxygen.  She is on approximately 2 L currently.  Clinically I am concerned about multiple sources of her severe fatigue and exertional shortness of breath.  Patient had work-up to look for anemia, infection, or other etiologies.  Lab work began to return showing anemia of 7.8.  This is decreased from prior, suspect symptomatic anemia as the cause of her shortness of breath.  Work-up also showed evidence of kidney injury with elevation of creatinine up to 6.67 with hyperkalemia greater than 7.5.  EKG was obtained and did not show peaked T waves.  She will be given medications to help with the hyperkalemia and nephrology will be called.  The fecal occult test was positive, with a symptomatic anemia and GI bleed, GI will be called.  Patient's rectal temp was slightly low however she is no longer tachycardic or tachypneic at this time.  Lactic acid was normal and no leukocytosis.  Urinalysis shows bacteria leukocytes but no nitrites.  There are also some squamous epithelial cells present.  Culture was sent.  Given her lack of urinary symptoms, lower suspicion for UTI at this time.  Suspect symptomatic anemia and kidney injury.  Patient's x-ray of the wrist showed distal radius and ulnar fractures.  Orthopedic hand surgery was called and they will see patient after admission but recommended a right sugar tong splint.  This was ordered.  Patient required admission however will determine if she  needs to go to [facility] before calling hospital service.  Coronavirus test is negative.  4:16 PM Spoke with Dr. Carollee Leitz with nephrology who recommended she be admitted to [facility] where she could get dialysis if needed.  They recommended Lokelma and 40 of Lasix IV.  He also recommended renal ultrasound to further evaluate.  They agreed with admission to medicine service.  Spoke with GI team for [facility] who recommended the GI team at [facility] be consulted when she arrives.  Hospitalist team will be called for admission to [facility].      Final Clinical Impressions(s) / ED Diagnoses   Final diagnoses:  Hyperkalemia  Symptomatic anemia  Closed fracture of right wrist, initial encounter    ED Discharge Orders    None     Clinical Impression: 1. Hyperkalemia   2. Symptomatic anemia   3. Closed fracture of right wrist, initial encounter     Disposition: Admit  This note was prepared with assistance of Dragon voice recognition software. Occasional wrong-word or sound-a-like substitutions may have occurred due to the inherent limitations of voice recognition software.     Jaquell Seddon, Gwenyth Allegra, MD 07/07/19 [phone]

## 2019-07-08 LAB — BLOOD CULTURE ID PANEL (REFLEXED)

## 2019-07-08 LAB — HEPATIC FUNCTION PANEL
ALT: 20 U/L (ref 0–44)
AST: 37 U/L (ref 15–41)
Albumin: 2.8 g/dL — ABNORMAL LOW (ref 3.5–5.0)
Alkaline Phosphatase: 97 U/L (ref 38–126)
Bilirubin, Direct: <0.1 mg/dL (ref 0.0–0.2)
Total Bilirubin: 0.6 mg/dL (ref 0.3–1.2)
Total Protein: 5.4 g/dL — ABNORMAL LOW (ref 6.5–8.1)

## 2019-07-08 LAB — TYPE AND SCREEN
ABO/RH(D): A POS
Antibody Screen: NEGATIVE
Unit division: 0

## 2019-07-08 LAB — GLUCOSE, CAPILLARY
Glucose-Capillary: 144 mg/dL — ABNORMAL HIGH (ref 70–99)
Glucose-Capillary: 119 mg/dL — ABNORMAL HIGH (ref 70–99)
Glucose-Capillary: 154 mg/dL — ABNORMAL HIGH (ref 70–99)
Glucose-Capillary: 241 mg/dL — ABNORMAL HIGH (ref 70–99)

## 2019-07-08 LAB — PROTEIN / CREATININE RATIO, URINE
Creatinine, Urine: 70.75 mg/dL
Protein Creatinine Ratio: 1.29 mg/mg{Cre} — ABNORMAL HIGH (ref 0.00–0.15)
Total Protein, Urine: 91 mg/dL

## 2019-07-08 LAB — BPAM RBC
Blood Product Expiration Date: 202008252359
ISSUE DATE / TIME: 202008032252
Unit Type and Rh: 6200

## 2019-07-08 LAB — RENAL FUNCTION PANEL
Albumin: 2.7 g/dL — ABNORMAL LOW (ref 3.5–5.0)
Anion gap: 14 (ref 5–15)
BUN: 93 mg/dL — ABNORMAL HIGH (ref 8–23)
CO2: 13 mmol/L — ABNORMAL LOW (ref 22–32)
Calcium: 8.6 mg/dL — ABNORMAL LOW (ref 8.9–10.3)
Chloride: 116 mmol/L — ABNORMAL HIGH (ref 98–111)
Creatinine, Ser: 5.28 mg/dL — ABNORMAL HIGH (ref 0.44–1.00)
GFR calc Af Amer: 8 mL/min — ABNORMAL LOW (ref >60)
GFR calc non Af Amer: 7 mL/min — ABNORMAL LOW (ref >60)
Glucose, Bld: 161 mg/dL — ABNORMAL HIGH (ref 70–99)
Phosphorus: 6.1 mg/dL — ABNORMAL HIGH (ref 2.5–4.6)
Potassium: 5 mmol/L (ref 3.5–5.1)
Sodium: 143 mmol/L (ref 135–145)

## 2019-07-08 LAB — SODIUM, URINE, RANDOM: Sodium, Ur: 86 mmol/L

## 2019-07-08 LAB — MRSA PCR SCREENING: MRSA by PCR: NEGATIVE

## 2019-07-08 LAB — HEMOGLOBIN A1C
Hgb A1c MFr Bld: 5.5 % (ref 4.8–5.6)
Mean Plasma Glucose: 111.15 mg/dL

## 2019-07-08 LAB — CREATININE, URINE, RANDOM: Creatinine, Urine: 70.24 mg/dL

## 2019-07-08 MED ORDER — SODIUM CHLORIDE 0.9 % IV BOLUS
1000.0000 mL | Freq: Once | INTRAVENOUS | Status: AC
  Administered 2019-07-08: 1000 mL via INTRAVENOUS

## 2019-07-08 MED ORDER — SODIUM CHLORIDE 0.9 % IV SOLN
1.0000 g | INTRAVENOUS | Status: DC
  Administered 2019-07-08: 1 g via INTRAVENOUS
  Filled 2019-07-08: qty 10

## 2019-07-08 NOTE — Progress Notes (Signed)
PHARMACY - PHYSICIAN COMMUNICATION CRITICAL VALUE ALERT - BLOOD CULTURE IDENTIFICATION (BCID)  Beth Garrison is an 78 y.o. female who presented to [facility] on 07/07/2019 with a chief complaint of diarrhea, weakness, and shortness of breath. WBC normal and afebrile.   Assessment: 1/4 coag negative staph in aerobic bottle  Name of physician (or Provider) Contacted: Dr. Pietro Cassis  Current antibiotics: none  Changes to prescribed antibiotics recommended: no changes  Results for orders placed or performed during the hospital encounter of 07/07/19  Blood Culture ID Panel (Reflexed) (Collected: 07/07/2019  2:25 PM)  Result Value Ref Range   Enterococcus species NOT DETECTED NOT DETECTED   Listeria monocytogenes NOT DETECTED NOT DETECTED   Staphylococcus species DETECTED (A) NOT DETECTED   Staphylococcus aureus (BCID) NOT DETECTED NOT DETECTED   Methicillin resistance NOT DETECTED NOT DETECTED   Streptococcus species NOT DETECTED NOT DETECTED   Streptococcus agalactiae NOT DETECTED NOT DETECTED   Streptococcus pneumoniae NOT DETECTED NOT DETECTED   Streptococcus pyogenes NOT DETECTED NOT DETECTED   Acinetobacter baumannii NOT DETECTED NOT DETECTED   Enterobacteriaceae species NOT DETECTED NOT DETECTED   Enterobacter cloacae complex NOT DETECTED NOT DETECTED   Escherichia coli NOT DETECTED NOT DETECTED   Klebsiella oxytoca NOT DETECTED NOT DETECTED   Klebsiella pneumoniae NOT DETECTED NOT DETECTED   Proteus species NOT DETECTED NOT DETECTED   Serratia marcescens NOT DETECTED NOT DETECTED   Haemophilus influenzae NOT DETECTED NOT DETECTED   Neisseria meningitidis NOT DETECTED NOT DETECTED   Pseudomonas aeruginosa NOT DETECTED NOT DETECTED   Candida albicans NOT DETECTED NOT DETECTED   Candida glabrata NOT DETECTED NOT DETECTED   Candida krusei NOT DETECTED NOT DETECTED   Candida parapsilosis NOT DETECTED NOT DETECTED   Candida tropicalis NOT DETECTED NOT DETECTED    Vertis Kelch,  PharmD PGY2 Cardiology Pharmacy Resident Phone [phone] 07/08/2019       3:44 PM  Please check [email or web address] for unit-specific pharmacist phone numbers

## 2019-07-08 NOTE — ED Notes (Signed)
Paperwork printed for [facility]

## 2019-07-08 NOTE — Consult Note (Signed)
Reason for Consult: Anemia, heme positive stools, and diarrhea Referring Physician: [facility]  Beth Garrison HPI: This is a 78 year old female with a PMH of CKD, DM, hyperlipidemia, and HTN admitted for lethargy, confusion, SOB, diarrhea, and AKI.  In the ER she was noted to have a metabolic acidosis and a significant anemia.  Her creatinine on admission was 6.67 and it was felt to be exacerbated with lisinopril and metformin, as well as NSAIDs.  She states that she was taking 2 ibuprofen tablets per day for the past couple of months to treat her arthritis.  Per renal, her last creatinine on 06/04/2018 was 1.31.  The HGB was at 7.8 g/dL and the prior available value was 12.0 g/dL on 12/31/2014.  Dr. Collene Mares performed a colonoscopy on 05/20/2014 for routine screening and she was noted to have a small ascending colon adenoma.  Prior to this admission the patient does not report any problems with melena, hematochezia, hematemesis, or hematuria.  In an ER note she was reported to have darker stools, but she denies any further bowel movements since being in the ER.  Past Medical History:  Diagnosis Date  . AKI (acute kidney injury) ([place]) 07/07/2019  . Diabetes mellitus without complication ([place])   . GERD (gastroesophageal reflux disease)   . Hyperlipidemia   . Hypertension     Past Surgical History:  Procedure Laterality Date  . TONSILLECTOMY AND ADENOIDECTOMY      Family History  Problem Relation Age of Onset  . Diabetes Mother   . Hyperlipidemia Mother   . Diabetes Father   . Diabetes Brother   . Diabetes Maternal Grandmother   . Diabetes Paternal Grandmother   . Cancer Neg Hx   . Stroke Neg Hx     Social History:  reports that she has never smoked. She has never used smokeless tobacco. She reports that she does not drink alcohol or use drugs.  Allergies: No Known Allergies  Medications:  Scheduled: . insulin aspart  0-9 Units Subcutaneous TID WC  . pantoprazole (PROTONIX) IV  40 mg  Intravenous Q12H  . sodium chloride flush  3 mL Intravenous Once   Continuous: .  sodium bicarbonate  infusion 1000 mL 125 mL/hr at 07/08/19 1052    Results for orders placed or performed during the hospital encounter of 07/07/19 (from the past 24 hour(s))  Type and screen [facility]     Status: None   Collection Time: 07/07/19  4:10 PM  Result Value Ref Range   ABO/RH(Garrison) A POS    Antibody Screen NEG    Sample Expiration 07/10/2019,2359    Unit Number [id number]    Blood Component Type RED CELLS,LR    Unit division 00    Status of Unit ISSUED,FINAL    Transfusion Status OK TO TRANSFUSE    Crossmatch Result      Compatible Performed at [facility], [place] [address], [place], [place] [address]   Prepare RBC     Status: None   Collection Time: 07/07/19  4:10 PM  Result Value Ref Range   Order Confirmation      ORDER PROCESSED BY BLOOD BANK Performed at [facility] [address], [place], [place] [address]   ABO/Rh     Status: None   Collection Time: 07/07/19  4:15 PM  Result Value Ref Range   ABO/RH(Garrison)      A POS Performed at [facility], [address] [place], [place] [address]   Potassium     Status: Abnormal   Collection Time: 07/07/19  5:39 PM  Result Value Ref Range   Potassium 6.4 (HH) 3.5 - 5.1 mmol/L  CBG monitoring, ED     Status: Abnormal   Collection Time: 07/07/19  6:56 PM  Result Value Ref Range   Glucose-Capillary 123 (H) 70 - 99 mg/dL  Hemoglobin A1c     Status: None   Collection Time: 07/07/19  8:18 PM  Result Value Ref Range   Hgb A1c MFr Bld 5.5 4.8 - 5.6 %   Mean Plasma Glucose 111.15 mg/dL  CK     Status: Abnormal   Collection Time: 07/07/19  8:18 PM  Result Value Ref Range   Total CK 2,774 (H) 38 - 234 U/L  Sodium, urine, random     Status: None   Collection Time: 07/07/19  8:18 PM  Result Value Ref Range   Sodium, Ur 86 mmol/L  Creatinine, urine, random      Status: None   Collection Time: 07/07/19  8:18 PM  Result Value Ref Range   Creatinine, Urine 70.24 mg/dL  Protein / creatinine ratio, urine     Status: Abnormal   Collection Time: 07/07/19  8:18 PM  Result Value Ref Range   Creatinine, Urine 70.75 mg/dL   Total Protein, Urine 91 mg/dL   Protein Creatinine Ratio 1.29 (H) 0.00 - 0.15 mg/mg[Cre]  Renal function panel     Status: Abnormal   Collection Time: 07/07/19  8:18 PM  Result Value Ref Range   Sodium 143 135 - 145 mmol/L   Potassium 6.4 (HH) 3.5 - 5.1 mmol/L   Chloride 120 (H) 98 - 111 mmol/L   CO2 9 (L) 22 - 32 mmol/L   Glucose, Bld 118 (H) 70 - 99 mg/dL   BUN 100 (H) 8 - 23 mg/dL   Creatinine, Ser 6.07 (H) 0.44 - 1.00 mg/dL   Calcium 9.0 8.9 - 10.3 mg/dL   Phosphorus 6.7 (H) 2.5 - 4.6 mg/dL   Albumin 3.5 3.5 - 5.0 g/dL   GFR calc non Af Amer 6 (L) >60 mL/min   GFR calc Af Amer 7 (L) >60 mL/min   Anion gap 14 5 - 15  CBG monitoring, ED     Status: None   Collection Time: 07/07/19 11:31 PM  Result Value Ref Range   Glucose-Capillary 90 70 - 99 mg/dL  MRSA PCR Screening     Status: None   Collection Time: 07/08/19  5:40 AM   Specimen: Nasal Mucosa; Nasopharyngeal  Result Value Ref Range   MRSA by PCR NEGATIVE NEGATIVE  Glucose, capillary     Status: Abnormal   Collection Time: 07/08/19  6:09 AM  Result Value Ref Range   Glucose-Capillary 144 (H) 70 - 99 mg/dL  Hepatic function panel     Status: Abnormal   Collection Time: 07/08/19  6:10 AM  Result Value Ref Range   Total Protein 5.4 (L) 6.5 - 8.1 g/dL   Albumin 2.8 (L) 3.5 - 5.0 g/dL   AST 37 15 - 41 U/L   ALT 20 0 - 44 U/L   Alkaline Phosphatase 97 38 - 126 U/L   Total Bilirubin 0.6 0.3 - 1.2 mg/dL   Bilirubin, Direct <0.1 0.0 - 0.2 mg/dL   Indirect Bilirubin NOT CALCULATED 0.3 - 0.9 mg/dL  Renal function panel     Status: Abnormal   Collection Time: 07/08/19  6:10 AM  Result Value  Ref Range   Sodium 143 135 - 145 mmol/L   Potassium 5.0 3.5 - 5.1 mmol/L    Chloride 116 (H) 98 - 111 mmol/L   CO2 13 (L) 22 - 32 mmol/L   Glucose, Bld 161 (H) 70 - 99 mg/dL   BUN 93 (H) 8 - 23 mg/dL   Creatinine, Ser 5.28 (H) 0.44 - 1.00 mg/dL   Calcium 8.6 (L) 8.9 - 10.3 mg/dL   Phosphorus 6.1 (H) 2.5 - 4.6 mg/dL   Albumin 2.7 (L) 3.5 - 5.0 g/dL   GFR calc non Af Amer 7 (L) >60 mL/min   GFR calc Af Amer 8 (L) >60 mL/min   Anion gap 14 5 - 15  BLOOD TRANSFUSION REPORT - SCANNED     Status: None   Collection Time: 07/08/19  9:53 AM   Narrative   Ordered by an unspecified provider.  Glucose, capillary     Status: Abnormal   Collection Time: 07/08/19 11:20 AM  Result Value Ref Range   Glucose-Capillary 119 (H) 70 - 99 mg/dL   Comment 1 Notify RN    Comment 2 Document in Chart      Dg Wrist Complete Right  Result Date: 07/07/2019 CLINICAL DATA:  Wrist pain secondary to a fall 1 week ago. EXAM: RIGHT WRIST - COMPLETE 3+ VIEW COMPARISON:  None. FINDINGS: There is an impacted fracture metaphysis of the distal right radius. The articular surface of the radius appears to be intact. Minimal lateral angulation. Normal dorsal or volar angulation. There is a fracture through medial aspect of the distal ulna with minimal distraction. Chondrocalcinosis. Arthritic changes at the radial side of the wrist. Slight arthritic changes of the MCP joints. IMPRESSION: Fractures of the distal radius and ulna as described. Electronically Signed   By: Lorriane Shire M.Garrison.   On: 07/07/2019 15:00   US Renal  Result Date: 07/07/2019 CLINICAL DATA:  Acute kidney injury EXAM: RENAL / URINARY TRACT ULTRASOUND COMPLETE COMPARISON:  None. FINDINGS: Right Kidney: Renal measurements: 10.3 x 4.4 x 5.8 cm = volume: 139.2 mL . Echogenicity within normal limits. No mass or hydronephrosis visualized. Left Kidney: Renal measurements: 8.2 x 3.4 x 4 cm = volume: 59.5 mL. Cortical echogenicity within normal limits. No hydronephrosis. Probable mild thinning of the cortex. Bladder: Appears normal for degree of  bladder distention. IMPRESSION: 1. Negative for hydronephrosis. 2. Slight asymmetric renal size left smaller than right with possible mild renal cortical thinning/atrophy on the left Electronically Signed   By: Donavan Foil M.Garrison.   On: 07/07/2019 18:14   Dg Chest Port 1 View  Result Date: 07/07/2019 CLINICAL DATA:  Status post left jugular central line placement EXAM: PORTABLE CHEST 1 VIEW COMPARISON:  Film from earlier in the same day. FINDINGS: Cardiac shadow is stable. The lungs are well aerated bilaterally. No focal infiltrate or sizable effusion is seen. No pneumothorax is noted. No bony abnormality is seen. Left jugular central line is noted at the cavoatrial junction. IMPRESSION: No pneumothorax following central line placement. Electronically Signed   By: Inez Catalina M.Garrison.   On: 07/07/2019 23:09   Dg Chest Portable 1 View  Result Date: 07/07/2019 CLINICAL DATA:  Weakness, diarrhea and shortness of breath today. EXAM: PORTABLE CHEST 1 VIEW COMPARISON:  PA and lateral chest 07/05/2010. FINDINGS: Lungs are clear. Heart size is normal. Aortic atherosclerosis noted. No pneumothorax or pleural fluid. No acute or focal bony abnormality. IMPRESSION: No acute disease. Atherosclerosis. Electronically Signed   By: Inge Rise  M.Garrison.   On: 07/07/2019 14:56    ROS:  As stated above in the HPI otherwise negative.  Blood pressure 103/60, pulse 93, temperature 97.8 F (36.6 C), temperature source Oral, resp. rate 15, height 5\' 5"  (1.651 m), weight 77.8 kg, SpO2 96 %.    PE: Gen: NAD, Alert and Oriented HEENT:  [place]/AT, EOMI Neck: Supple, no LAD Lungs: CTA Bilaterally CV: RRR without M/G/R ABM: Soft, NTND, +BS Ext: No C/C/E in lower extremities, right arm in soft splint  Assessment/Plan: 1) Anemia. 2) AKI. 3) CKD. 4) DM.   The patient is improved as compared to the yesterday's notes.  Her mentation is better, but it is still slow.  She does not report any overt bleeding issues, but the ER  observed dark stool and the use of ibuprofen does lend credence to a GI source of anemia.  However, this anemia can be multifactorial in that she has CKD.  Currently, she is still very weak appearing and her blood pressure is noted to still be "soft".  An endoscopic work up in necessary, but it remains to be determined if she will benefit from an EGD versus an EGD/colonoscopy.  If she still remains weak over the coming days, an EGD will be performed as she would not be able to tolerate drinking the prep for a colonoscopy.  Plan: 1) Monitor HGB and transfuse as necessary. 2) Continue with supportive care.Marland Kitchen 3) Endoscopic work up Thursday or Friday, if the patient can tolerate the procedures.  Beth Garrison 07/08/2019, 2:50 PM

## 2019-07-08 NOTE — ED Notes (Signed)
[facility] arrived for transport 

## 2019-07-08 NOTE — Plan of Care (Signed)

## 2019-07-08 NOTE — Progress Notes (Signed)
PROGRESS NOTE  Beth Garrison UYQ:[id number] DOB: 1941-07-07 DOA: 07/07/2019 PCP: System, Pcp Not In   LOS: 1 day   Brief narrative: Patient is a 78 year old female with PMH significant for DM, CKD stage 3, GERD, HTN, and HLD who presented to [facility] on 8/3 with weakness, diarrhea, shortness of breath, and confusion.  She also had a fall last week and has a fractured left wrist.   In the ED, patient was found to be hypoxic in 80s. Work-up showed sodium 139, potassium 7.5, CO2 9, creatinine 6.6, hemoglobin 7.8. X-ray of the right wrist showed distal radius and ulna fracture.  Patient was admitted to [facility] for further evaluation and management.  Nephrology consultation was obtained.  Subjective: Patient was seen and examined this morning.  Drowsy, slow to respond, nods head mostly.  Not in distress  Assessment/Plan:  Principal Problem:   Hyperkalemia Active Problems:   Type II diabetes mellitus with manifestations (H[facility])   Hyperlipidemia with target LDL less than 100   HTN (hypertension)   Renal failure   AKI (acute kidney injury) ([place])   Anemia   Melena  AKI on chronic kidney disease a stage III Metabolic acidosis Hyperkalemia -Per records patient creatinine on July 2 was 1.3 subsequently on July 17 was 2.9. -At home, patient was taking metformin, ACE inhibitor, diuretic.  She also had poor oral intake and hypovolemia. -Seen by nephrology.  He started on IV bicarbonate drip.  IV Lasix, insulin and dextrose, Lokelma was given.   -Offending meds held. -Labs from this morning show improvement in creatinine to 5.28, CO2 to 13 and potassium to 5. -Continue bicarb drip.  Repeat labs tomorrow.  Anemia:  Could be multifactorial anemia of chronic disease and component of acute GI bleed. 1 unit of packed red blood cell transfused. -Repeat CBC, anemia panel. -PPI.  Diarrhea, ? Melena:  Occult blood positive.  Episode of brown, blackish stool in the ED.  [facility] consulted.   Diabetes; type II: -Metformin on hold.  Sliding scale insulin with Accu-Cheks.  A1c 5.5.    Wrist fracture;  She had splint. Fractures of the distal radius and ulna as described.  Acute toxic metabolic encephalopathy;  -Expect improvement in mental status.  Body mass index is 28.54 kg/m. Mobility: Needs PT evaluation once clinically appropriate Diet: Renal diet DVT prophylaxis:  SCDs Code Status:   Code Status: Full Code  Family Communication:  Expected Discharge:  Ultimately home in 2 to 3 days.  Consultants:  Nephrology, GI  Procedures:    Antimicrobials:  Anti-infectives (From admission, onward)   None      Infusions:  .  sodium bicarbonate  infusion 1000 mL 125 mL/hr at 07/08/19 1052    Scheduled Meds: . insulin aspart  0-9 Units Subcutaneous TID WC  . pantoprazole (PROTONIX) IV  40 mg Intravenous Q12H  . sodium chloride flush  3 mL Intravenous Once    PRN meds: acetaminophen **OR** acetaminophen, ondansetron **OR** ondansetron (ZOFRAN) IV   Objective: Vitals:   07/08/19 0722 07/08/19 1021  BP: (!) 111/59 103/60  Pulse: 98 93  Resp: 14 15  Temp: 97.8 F (36.6 C) 97.8 F (36.6 C)  SpO2: 100% 96%    Intake/Output Summary (Last 24 hours) at 07/08/2019 1522 Last data filed at 07/08/2019 0500 Gross per 24 hour  Intake 2466.99 ml  Output -  Net 2466.99 ml   Filed Weights   07/07/19 1427 07/08/19 0500  Weight: 78.9 kg 77.8 kg   Weight change:  Body mass index is 28.54 kg/m.   Physical Exam: General exam: Appears calm and comfortable.  Slow to respond. Skin: No rashes, lesions or ulcers. HEENT: Atraumatic, normocephalic, supple neck, no obvious bleeding Lungs: Clear to auscultation bilaterally CVS: Regular rate and rhythm, no murmur GI/Abd soft, nontender, nondistended, bowel sound present CNS: Opens eyes on verbal command, able to follow simple commands Psychiatry: Mood appropriate Extremities: No pedal edema, no calf tenderness  Data  Review: I have personally reviewed the laboratory data and studies available.  Recent Labs  Lab 07/07/19 1425  WBC 9.5  NEUTROABS 7.8*  HGB 7.8*  HCT 26.0*  MCV 97.0  PLT 235   Recent Labs  Lab 07/07/19 1425 07/07/19 1739 07/07/19 2018 07/08/19 0610  NA 139  --  143 143  K >7.5* 6.4* 6.4* 5.0  CL 116*  --  120* 116*  CO2 9*  --  9* 13*  GLUCOSE 154*  --  118* 161*  BUN 116*  --  100* 93*  CREATININE 6.67*  --  6.07* 5.28*  CALCIUM 8.9  --  9.0 8.6*  PHOS  --   --  6.7* 6.1*    Terrilee Croak, MD  [facility] 07/08/2019

## 2019-07-08 NOTE — Progress Notes (Signed)
Paged Dr. Pietro Cassis regarding pt diet. Patient has been NPO since arrival.  Awaiting orders. RN will continue to monitor.  Paged returned Dr. Pietro Cassis will place diet order. Pt alert and oriented x4.  RN will continue to monitor pt monitor.

## 2019-07-08 NOTE — Progress Notes (Signed)
Subjective:  Pt transported to [facility] early this AM.  BP soft but no pressors required, no UOP recorded- maybe 50 ccs in purewick here.  K was down to 6.4 last night   Objective Vital signs in last 24 hours: Vitals:   07/08/19 0400 07/08/19 0415 07/08/19 0430 07/08/19 0500  BP: (!) 110/43  (!) 109/43 (!) 116/57  Pulse: 94 91 87 (!) 106  Resp: 13 13 13 17   Temp:    97.6 F (36.4 C)  TempSrc:    Oral  SpO2: 100% 100% 100% 100%  Weight:    77.8 kg  Height:    5\' 5"  (1.651 m)   Weight change:   Intake/Output Summary (Last 24 hours) at 07/08/2019 9562 Last data filed at 07/08/2019 0500 Gross per 24 hour  Intake 2466.99 ml  Output -  Net 2466.99 ml    Assessment/ Plan: Pt is a 78 y.o. yo female with DM, CKD 3 and HTN on ACE and metformin, ? NSAIDS who was admitted on 07/07/2019 with confusion, AKI with hyperkalemia   Assessment/Plan: 1. Renal- AKI in the setting of ACE and possibly NSAIDS- crt 1.16 on 01/09/18, 1.31 on 06/04/18.   Now over 6.  Presumably due to hemodynamics with meds complicating- crt has trended down a little last night with stopping ACE and metformin.  Labs are pending this AM.  Still seems a little dry, will give another fluid bolus.  Other labs to rule out etiology are pending, CK high at close to 3000.  No urgent needs for HD at present, hopefully we are able to hold off  2. Metabolic acidosis- due to renal failure and also metformin-  Metformin held - getting sodium bicarb IV- will inc rate  3. Hyperkalemia- trended down with medical management including lokelma.  Will watch for results this AM and act as needed  4. Anemia- new, check iron stores  5. HTN/volume- still seems a little dry to me- will re bolus and also getting continuous bicarb drip   Beth Garrison    Labs: Basic Metabolic Panel: Recent Labs  Lab 07/07/19 1425 07/07/19 1739 07/07/19 2018  NA 139  --  143  K >7.5* 6.4* 6.4*  CL 116*  --  120*  CO2 9*  --  9*  GLUCOSE 154*  --  118*  BUN 116*   --  100*  CREATININE 6.67*  --  6.07*  CALCIUM 8.9  --  9.0  PHOS  --   --  6.7*   Liver Function Tests: Recent Labs  Lab 07/07/19 1425 07/07/19 2018  AST 45*  --   ALT 21  --   ALKPHOS 120  --   BILITOT 0.5  --   PROT 7.0  --   ALBUMIN 3.5 3.5   No results for input(s): LIPASE, AMYLASE in the last 168 hours. No results for input(s): AMMONIA in the last 168 hours. CBC: Recent Labs  Lab 07/07/19 1425  WBC 9.5  NEUTROABS 7.8*  HGB 7.8*  HCT 26.0*  MCV 97.0  PLT 235   Cardiac Enzymes: Recent Labs  Lab 07/07/19 2018  CKTOTAL 2,774*   CBG: Recent Labs  Lab 07/07/19 1856 07/07/19 2331 07/08/19 0609  GLUCAP 123* 90 144*    Iron Studies:  Recent Labs    07/07/19 1418  IRON 62  TIBC 229*  FERRITIN 168   Studies/Results: Dg Wrist Complete Right  Result Date: 07/07/2019 CLINICAL DATA:  Wrist pain secondary to a fall 1 week  ago. EXAM: RIGHT WRIST - COMPLETE 3+ VIEW COMPARISON:  None. FINDINGS: There is an impacted fracture metaphysis of the distal right radius. The articular surface of the radius appears to be intact. Minimal lateral angulation. Normal dorsal or volar angulation. There is a fracture through medial aspect of the distal ulna with minimal distraction. Chondrocalcinosis. Arthritic changes at the radial side of the wrist. Slight arthritic changes of the MCP joints. IMPRESSION: Fractures of the distal radius and ulna as described. Electronically Signed   By: Lorriane Shire M.D.   On: 07/07/2019 15:00   US Renal  Result Date: 07/07/2019 CLINICAL DATA:  Acute kidney injury EXAM: RENAL / URINARY TRACT ULTRASOUND COMPLETE COMPARISON:  None. FINDINGS: Right Kidney: Renal measurements: 10.3 x 4.4 x 5.8 cm = volume: 139.2 mL . Echogenicity within normal limits. No mass or hydronephrosis visualized. Left Kidney: Renal measurements: 8.2 x 3.4 x 4 cm = volume: 59.5 mL. Cortical echogenicity within normal limits. No hydronephrosis. Probable mild thinning of the cortex.  Bladder: Appears normal for degree of bladder distention. IMPRESSION: 1. Negative for hydronephrosis. 2. Slight asymmetric renal size left smaller than right with possible mild renal cortical thinning/atrophy on the left Electronically Signed   By: Donavan Foil M.D.   On: 07/07/2019 18:14   Dg Chest Port 1 View  Result Date: 07/07/2019 CLINICAL DATA:  Status post left jugular central line placement EXAM: PORTABLE CHEST 1 VIEW COMPARISON:  Film from earlier in the same day. FINDINGS: Cardiac shadow is stable. The lungs are well aerated bilaterally. No focal infiltrate or sizable effusion is seen. No pneumothorax is noted. No bony abnormality is seen. Left jugular central line is noted at the cavoatrial junction. IMPRESSION: No pneumothorax following central line placement. Electronically Signed   By: Inez Catalina M.D.   On: 07/07/2019 23:09   Dg Chest Portable 1 View  Result Date: 07/07/2019 CLINICAL DATA:  Weakness, diarrhea and shortness of breath today. EXAM: PORTABLE CHEST 1 VIEW COMPARISON:  PA and lateral chest 07/05/2010. FINDINGS: Lungs are clear. Heart size is normal. Aortic atherosclerosis noted. No pneumothorax or pleural fluid. No acute or focal bony abnormality. IMPRESSION: No acute disease. Atherosclerosis. Electronically Signed   By: Inge Rise M.D.   On: 07/07/2019 14:56   Medications: Infusions: .  sodium bicarbonate  infusion 1000 mL 100 mL/hr at 07/07/19 2335    Scheduled Medications: . insulin aspart  0-9 Units Subcutaneous TID WC  . pantoprazole (PROTONIX) IV  40 mg Intravenous Q12H  . sodium chloride flush  3 mL Intravenous Once    have reviewed scheduled and prn medications.  Physical Exam: General: more alert than previously noted.  She knows she is at [place]: RRR Lungs: mostly clear Abdomen: soft, non tender Extremities: no edema  About 50ccs noted in purewick container    07/08/2019,7:05 AM  LOS: 1 day

## 2019-07-08 NOTE — ED Notes (Signed)
ED TO INPATIENT HANDOFF REPORT  Name/Age/Gender Beth Garrison 78 y.o. female  Code Status    Code Status Orders  (From admission, onward)         Start     Ordered   07/07/19 1947  Full code  Continuous     07/07/19 1946        Code Status History    This patient has a current code status but no historical code status.   Advance Care Planning Activity      Home/SNF/Other Home  Chief Complaint weakness shob  Level of Care/Admitting Diagnosis ED Disposition    ED Disposition Condition [facility] Area: [place] [100100]  Level of Care: Progressive [102]  Covid Evaluation: Confirmed COVID Negative  Diagnosis: Renal failure [324401]  Admitting Physician: Barkley Boards  Attending Physician: Elmarie Shiley [phone]  Estimated length of stay: 3 - 4 days  Certification:: I certify this patient will need inpatient services for at least 2 midnights  PT Class (Do Not Modify): Inpatient [101]  PT Acc Code (Do Not Modify): Private [1]       Medical History Past Medical History:  Diagnosis Date  . AKI (acute kidney injury) ([place]) 07/07/2019  . Diabetes mellitus without complication ([place])   . GERD (gastroesophageal reflux disease)   . Hyperlipidemia   . Hypertension     Allergies No Known Allergies  IV Location/Drains/Wounds Patient Lines/Drains/Airways Status   Active Line/Drains/Airways    Name:   Placement date:   Placement time:   Site:   Days:   Peripheral IV 07/07/19 Right Foot   07/07/19    2036    Foot   1   CVC Triple Lumen 07/07/19 Left   07/07/19    2227     1          Labs/Imaging Results for orders placed or performed during the hospital encounter of 07/07/19 (from the past 48 hour(s))  Vitamin B12     Status: None   Collection Time: 07/07/19  2:18 PM  Result Value Ref Range   Vitamin B-12 198 180 - 914 pg/mL    Comment: (NOTE) This assay is not validated for testing neonatal  or myeloproliferative syndrome specimens for Vitamin B12 levels. Performed at [facility], [place] [address] [place], [place] [address]   Folate     Status: None   Collection Time: 07/07/19  2:18 PM  Result Value Ref Range   Folate 8.8 >5.9 ng/mL    Comment: Performed at [facility], [place] [address] [place], [place] [address]  Iron and TIBC     Status: Abnormal   Collection Time: 07/07/19  2:18 PM  Result Value Ref Range   Iron 62 28 - 170 ug/dL   TIBC 229 (L) 250 - 450 ug/dL   Saturation Ratios 27 10.4 - 31.8 %   UIBC 167 ug/dL    Comment: Performed at [facility], [place] [address], [place], [place] [address]  Ferritin     Status: None   Collection Time: 07/07/19  2:18 PM  Result Value Ref Range   Ferritin 168 11 - 307 ng/mL    Comment: Performed at [facility], [place] [address], [place], [place] [address]  Reticulocytes     Status: Abnormal   Collection Time: 07/07/19  2:18 PM  Result Value Ref Range   Retic Ct Pct 1.8 0.4 - 3.1 %  RBC. 2.66 (L) 3.87 - 5.11 MIL/uL   Retic Count, Absolute 48.7 19.0 - 186.0 K/uL   Immature Retic Fract 18.3 (H) 2.3 - 15.9 %    Comment: Performed at [facility], [place] [address] [place], [place] [address]  Urinalysis, Routine w reflex microscopic     Status: Abnormal   Collection Time: 07/07/19  2:25 PM  Result Value Ref Range   Color, Urine YELLOW YELLOW   APPearance CLOUDY (A) CLEAR   Specific Gravity, Urine 1.012 1.005 - 1.030   pH 7.0 5.0 - 8.0   Glucose, UA NEGATIVE NEGATIVE mg/dL   Hgb urine dipstick MODERATE (A) NEGATIVE   Bilirubin Urine NEGATIVE NEGATIVE   Ketones, ur NEGATIVE NEGATIVE mg/dL   Protein, ur 100 (A) NEGATIVE mg/dL   Nitrite NEGATIVE NEGATIVE   Leukocytes,Ua MODERATE (A) NEGATIVE   RBC / HPF 0-5 0 - 5 RBC/hpf   WBC, UA 11-20 0 - 5 WBC/hpf   Bacteria, UA MANY (A) NONE SEEN   Squamous Epithelial / LPF 6-10 0 - 5    Comment:  Performed at [facility], [place] [address], [place], [place] [address]  Comprehensive metabolic panel     Status: Abnormal   Collection Time: 07/07/19  2:25 PM  Result Value Ref Range   Sodium 139 135 - 145 mmol/L   Potassium >7.5 (HH) 3.5 - 5.1 mmol/L    Comment: CRITICAL RESULT CALLED TO, READ BACK BY AND VERIFIED WITH: MORGAN ROSSER @ 1538 ON 07/07/2019 C VARNER    Chloride 116 (H) 98 - 111 mmol/L   CO2 9 (L) 22 - 32 mmol/L   Glucose, Bld 154 (H) 70 - 99 mg/dL   BUN 116 (H) 8 - 23 mg/dL    Comment: RESULTS CONFIRMED BY MANUAL DILUTION   Creatinine, Ser 6.67 (H) 0.44 - 1.00 mg/dL   Calcium 8.9 8.9 - 10.3 mg/dL   Total Protein 7.0 6.5 - 8.1 g/dL   Albumin 3.5 3.5 - 5.0 g/dL   AST 45 (H) 15 - 41 U/L   ALT 21 0 - 44 U/L   Alkaline Phosphatase 120 38 - 126 U/L   Total Bilirubin 0.5 0.3 - 1.2 mg/dL   GFR calc non Af Amer 6 (L) >60 mL/min   GFR calc Af Amer 6 (L) >60 mL/min   Anion gap 14 5 - 15    Comment: Performed at [facility], [place] [address], [place], [place] [address]  Lactic acid, plasma     Status: None   Collection Time: 07/07/19  2:25 PM  Result Value Ref Range   Lactic Acid, Venous 0.7 0.5 - 1.9 mmol/L    Comment: Performed at [facility], [place] [address], [place], [place] [address]  CBC with Differential     Status: Abnormal   Collection Time: 07/07/19  2:25 PM  Result Value Ref Range   WBC 9.5 4.0 - 10.5 K/uL   RBC 2.68 (L) 3.87 - 5.11 MIL/uL   Hemoglobin 7.8 (L) 12.0 - 15.0 g/dL   HCT 26.0 (L) 36.0 - 46.0 %   MCV 97.0 80.0 - 100.0 fL   MCH 29.1 26.0 - 34.0 pg   MCHC 30.0 30.0 - 36.0 g/dL   RDW 14.2 11.5 - 15.5 %   Platelets 235 150 - 400 K/uL   nRBC 0.2 0.0 - 0.2 %   Neutrophils Relative % 82 %   Neutro Abs 7.8 (H) 1.7 - 7.7 K/uL  Lymphocytes Relative 10 %   Lymphs Abs 0.9 0.7 - 4.0 K/uL   Monocytes Relative 6 %   Monocytes Absolute 0.6 0.1 - 1.0 K/uL   Eosinophils Relative 0 %   Eosinophils Absolute  0.0 0.0 - 0.5 K/uL   Basophils Relative 0 %   Basophils Absolute 0.0 0.0 - 0.1 K/uL   Immature Granulocytes 2 %   Abs Immature Granulocytes 0.18 (H) 0.00 - 0.07 K/uL    Comment: Performed at [facility], [place] [address], [place], [place] [address]  Protime-INR     Status: Abnormal   Collection Time: 07/07/19  2:25 PM  Result Value Ref Range   Prothrombin Time 17.4 (H) 11.4 - 15.2 seconds   INR 1.4 (H) 0.8 - 1.2    Comment: (NOTE) INR goal varies based on device and disease states. Performed at [facility], [place] [address] [place], [place] [address]   SARS Coronavirus 2 [facility] order, Performed in [facility] hospital lab) Nasopharyngeal Nasopharyngeal Swab     Status: None   Collection Time: 07/07/19  2:31 PM   Specimen: Nasopharyngeal Swab  Result Value Ref Range   SARS Coronavirus 2 NEGATIVE NEGATIVE    Comment: (NOTE) If result is NEGATIVE SARS-CoV-2 target nucleic acids are NOT DETECTED. The SARS-CoV-2 RNA is generally detectable in upper and lower  respiratory specimens during the acute phase of infection. The lowest  concentration of SARS-CoV-2 viral copies this assay can detect is 250  copies / mL. A negative result does not preclude SARS-CoV-2 infection  and should not be used as the sole basis for treatment or other  patient management decisions.  A negative result may occur with  improper specimen collection / handling, submission of specimen other  than nasopharyngeal swab, presence of viral mutation(s) within the  areas targeted by this assay, and inadequate number of viral copies  (<250 copies / mL). A negative result must be combined with clinical  observations, patient history, and epidemiological information. If result is POSITIVE SARS-CoV-2 target nucleic acids are DETECTED. The SARS-CoV-2 RNA is generally detectable in upper and lower  respiratory specimens dur ing the acute phase of infection.  Positive  results are  indicative of active infection with SARS-CoV-2.  Clinical  correlation with patient history and other diagnostic information is  necessary to determine patient infection status.  Positive results do  not rule out bacterial infection or co-infection with other viruses. If result is PRESUMPTIVE POSTIVE SARS-CoV-2 nucleic acids MAY BE PRESENT.   A presumptive positive result was obtained on the submitted specimen  and confirmed on repeat testing.  While 2019 novel coronavirus  (SARS-CoV-2) nucleic acids may be present in the submitted sample  additional confirmatory testing may be necessary for epidemiological  and / or clinical management purposes  to differentiate between  SARS-CoV-2 and other Sarbecovirus currently known to infect humans.  If clinically indicated additional testing with an alternate test  methodology [phone]) is advised. The SARS-CoV-2 RNA is generally  detectable in upper and lower respiratory sp ecimens during the acute  phase of infection. The expected result is Negative. Fact Sheet for Patients:  StrictlyIdeas.no Fact Sheet for Healthcare Providers: BankingDealers.co.za This test is not yet approved or cleared by the [place] FDA and has been authorized for detection and/or diagnosis of SARS-CoV-2 by FDA under an Emergency Use Authorization (EUA).  This EUA will remain in effect (meaning this test can be used) for the duration of the COVID-19 declaration under  Section 564(b)(1) of the Act, 21 U.S.C. section 360bbb-3(b)(1), unless the authorization is terminated or revoked sooner. Performed at [facility], [place] [address], [place], [place] [address]   Type and screen [place]     Status: None (Preliminary result)   Collection Time: 07/07/19  4:10 PM  Result Value Ref Range   ABO/RH(D) A POS    Antibody Screen NEG    Sample Expiration 07/10/2019,2359    Unit Number  [id number]    Blood Component Type RED CELLS,LR    Unit division 00    Status of Unit ISSUED    Transfusion Status OK TO TRANSFUSE    Crossmatch Result      Compatible Performed at [facility], [place] [address], [place], [place] [address]   Prepare RBC     Status: None   Collection Time: 07/07/19  4:10 PM  Result Value Ref Range   Order Confirmation      ORDER PROCESSED BY BLOOD BANK Performed at [place] [address], [place], [place] [address]   ABO/Rh     Status: None   Collection Time: 07/07/19  4:15 PM  Result Value Ref Range   ABO/RH(D)      A POS Performed at [facility], [place] [address] [place], [place] [address]   Potassium     Status: Abnormal   Collection Time: 07/07/19  5:39 PM  Result Value Ref Range   Potassium 6.4 (HH) 3.5 - 5.1 mmol/L    Comment: CRITICAL RESULT CALLED TO, READ BACK BY AND VERIFIED WITH: MORGAN ROSSER @ 9357 ON 07/07/2019 Sandy Salaam Performed at [facility], [place] [address] [place], [place] [address]   CBG monitoring, ED     Status: Abnormal   Collection Time: 07/07/19  6:56 PM  Result Value Ref Range   Glucose-Capillary 123 (H) 70 - 99 mg/dL  Hemoglobin A1c     Status: None   Collection Time: 07/07/19  8:18 PM  Result Value Ref Range   Hgb A1c MFr Bld 5.5 4.8 - 5.6 %    Comment: (NOTE) Pre diabetes:          5.7%-6.4% Diabetes:              >6.4% Glycemic control for   <7.0% adults with diabetes    Mean Plasma Glucose 111.15 mg/dL    Comment: Performed at [place] [address] [place], [place] [address]  CK     Status: Abnormal   Collection Time: 07/07/19  8:18 PM  Result Value Ref Range   Total CK 2,774 (H) 38 - 234 U/L    Comment: Performed at [facility], [place] [address] [place], [place] [address]  Sodium, urine, random     Status: None   Collection Time: 07/07/19  8:18 PM  Result Value Ref Range   Sodium, Ur  86 mmol/L    Comment: Performed at [facility], [place] [address] [place], [place] [address]  Creatinine, urine, random     Status: None   Collection Time: 07/07/19  8:18 PM  Result Value Ref Range   Creatinine, Urine 70.24 mg/dL    Comment: Performed at [facility], [place] [address] [place],  [address]  Protein / creatinine ratio, urine     Status: Abnormal   Collection Time: 07/07/19  8:18 PM  Result Value Ref Range   Creatinine, Urine  70.75 mg/dL   Total Protein, Urine 91 mg/dL    Comment: NO NORMAL RANGE ESTABLISHED FOR THIS TEST   Protein Creatinine Ratio 1.29 (H) 0.00 - 0.15 mg/mg[Cre]    Comment: Performed at [facility], [place] [address] [place], [place] [address]  Renal function panel     Status: Abnormal   Collection Time: 07/07/19  8:18 PM  Result Value Ref Range   Sodium 143 135 - 145 mmol/L   Potassium 6.4 (HH) 3.5 - 5.1 mmol/L    Comment: CRITICAL RESULT CALLED TO, READ BACK BY AND VERIFIED WITH: DECHAMPLAIN,A @ 2208 ON [id number] BY POTEAT,S    Chloride 120 (H) 98 - 111 mmol/L   CO2 9 (L) 22 - 32 mmol/L   Glucose, Bld 118 (H) 70 - 99 mg/dL   BUN 100 (H) 8 - 23 mg/dL    Comment: RESULTS CONFIRMED BY MANUAL DILUTION   Creatinine, Ser 6.07 (H) 0.44 - 1.00 mg/dL   Calcium 9.0 8.9 - 10.3 mg/dL   Phosphorus 6.7 (H) 2.5 - 4.6 mg/dL   Albumin 3.5 3.5 - 5.0 g/dL   GFR calc non Af Amer 6 (L) >60 mL/min   GFR calc Af Amer 7 (L) >60 mL/min   Anion gap 14 5 - 15    Comment: Performed at [facility], [place] [address], [place], [place] [address]  CBG monitoring, ED     Status: None   Collection Time: 07/07/19 11:31 PM  Result Value Ref Range   Glucose-Capillary 90 70 - 99 mg/dL   Dg Wrist Complete Right  Result Date: 07/07/2019 CLINICAL DATA:  Wrist pain secondary to a fall 1 week ago. EXAM: RIGHT WRIST - COMPLETE 3+ VIEW COMPARISON:  None. FINDINGS: There is an impacted fracture metaphysis of the  distal right radius. The articular surface of the radius appears to be intact. Minimal lateral angulation. Normal dorsal or volar angulation. There is a fracture through medial aspect of the distal ulna with minimal distraction. Chondrocalcinosis. Arthritic changes at the radial side of the wrist. Slight arthritic changes of the MCP joints. IMPRESSION: Fractures of the distal radius and ulna as described. Electronically Signed   By: Lorriane Shire M.D.   On: 07/07/2019 15:00   US Renal  Result Date: 07/07/2019 CLINICAL DATA:  Acute kidney injury EXAM: RENAL / URINARY TRACT ULTRASOUND COMPLETE COMPARISON:  None. FINDINGS: Right Kidney: Renal measurements: 10.3 x 4.4 x 5.8 cm = volume: 139.2 mL . Echogenicity within normal limits. No mass or hydronephrosis visualized. Left Kidney: Renal measurements: 8.2 x 3.4 x 4 cm = volume: 59.5 mL. Cortical echogenicity within normal limits. No hydronephrosis. Probable mild thinning of the cortex. Bladder: Appears normal for degree of bladder distention. IMPRESSION: 1. Negative for hydronephrosis. 2. Slight asymmetric renal size left smaller than right with possible mild renal cortical thinning/atrophy on the left Electronically Signed   By: Donavan Foil M.D.   On: 07/07/2019 18:14   Dg Chest Port 1 View  Result Date: 07/07/2019 CLINICAL DATA:  Status post left jugular central line placement EXAM: PORTABLE CHEST 1 VIEW COMPARISON:  Film from earlier in the same day. FINDINGS: Cardiac shadow is stable. The lungs are well aerated bilaterally. No focal infiltrate or sizable effusion is seen. No pneumothorax is noted. No bony abnormality is seen. Left jugular central line is noted at the cavoatrial junction. IMPRESSION: No pneumothorax following central line placement. Electronically Signed   By: Inez Catalina M.D.   On: 07/07/2019 23:09  Dg Chest Portable 1 View  Result Date: 07/07/2019 CLINICAL DATA:  Weakness, diarrhea and shortness of breath today. EXAM: PORTABLE CHEST 1  VIEW COMPARISON:  PA and lateral chest 07/05/2010. FINDINGS: Lungs are clear. Heart size is normal. Aortic atherosclerosis noted. No pneumothorax or pleural fluid. No acute or focal bony abnormality. IMPRESSION: No acute disease. Atherosclerosis. Electronically Signed   By: Inge Rise M.D.   On: 07/07/2019 14:56    Pending Labs Unresulted Labs (From admission, onward)    Start     Ordered   07/08/19 0500  Comprehensive metabolic panel  Tomorrow morning,   R     07/07/19 2231   07/08/19 0500  Renal function panel  Tomorrow morning,   R     07/07/19 1803   07/08/19 0500  CBC  Tomorrow morning,   R     07/07/19 1803   07/07/19 1803  Kappa/lambda light chains  Once,   STAT     07/07/19 1802   07/07/19 1803  Immunofixation electrophoresis  Once,   STAT     07/07/19 1802   07/07/19 1803  ANA, IFA (with reflex)  Once,   STAT     07/07/19 1802   07/07/19 1803  Mpo/pr-3 (anca) antibodies  Once,   STAT     07/07/19 1802   07/07/19 1803  C3 complement  Once,   STAT     07/07/19 1802   07/07/19 1803  C4 complement  Once,   STAT     07/07/19 1802   07/07/19 1803  Complement, total  Once,   STAT     07/07/19 1802   07/07/19 1803  Antistreptolysin O titer  Once,   STAT     07/07/19 1802   07/07/19 1803  Anti-DNA antibody, double-stranded  Once,   STAT     07/07/19 1802   07/07/19 1803  Immunofixation, urine  Once,   STAT     07/07/19 1802   07/07/19 1418  Urine culture  ONCE - STAT,   STAT     07/07/19 1419   07/07/19 1414  Culture, blood (Routine x 2)  BLOOD CULTURE X 2,   STAT     07/07/19 1414          Vitals/Pain Today's Vitals   07/08/19 0030 07/08/19 0100 07/08/19 0138 07/08/19 0200  BP: (!) 113/51 (!) 114/39 (!) 107/58 (!) 106/49  Pulse: (!) 101 (!) 102 100 95  Resp: 16 16 16 16   Temp:      TempSrc:      SpO2: 100% 100% 99% 100%  Weight:      Height:      PainSc:        Isolation Precautions No active isolations  Medications Medications  sodium chloride flush  (NS) 0.9 % injection 3 mL (3 mLs Intravenous Refused 07/07/19 1642)  sodium bicarbonate 150 mEq in dextrose 5 % 1,000 mL infusion ( Intravenous Restarted 07/07/19 2335)  acetaminophen (TYLENOL) tablet 650 mg (has no administration in time range)    Or  acetaminophen (TYLENOL) suppository 650 mg (has no administration in time range)  ondansetron (ZOFRAN) tablet 4 mg (has no administration in time range)    Or  ondansetron (ZOFRAN) injection 4 mg (has no administration in time range)  pantoprazole (PROTONIX) injection 40 mg (40 mg Intravenous Given 07/07/19 2332)  insulin aspart (novoLOG) injection 0-9 Units (has no administration in time range)  sodium chloride 0.9 % bolus 500 mL (0 mLs Intravenous Stopped  07/07/19 1643)  0.9 %  sodium chloride infusion (0 mL/hr Intravenous Stopped 07/07/19 1830)  calcium gluconate 1 g/ 50 mL sodium chloride IVPB (0 g Intravenous Stopped 07/07/19 1640)  insulin aspart (novoLOG) injection 5 Units (5 Units Intravenous Given 07/07/19 1604)    And  dextrose 50 % solution 50 mL (50 mLs Intravenous Given 07/07/19 1603)  albuterol (VENTOLIN HFA) 108 (90 Base) MCG/ACT inhaler 2 puff (2 puffs Inhalation Given 07/07/19 1656)  sodium chloride 0.9 % bolus 1,000 mL (0 mLs Intravenous Stopped 07/07/19 1646)  sodium zirconium cyclosilicate (LOKELMA) packet 10 g (10 g Oral Given 07/07/19 1642)  furosemide (LASIX) injection 40 mg (40 mg Intravenous Given 07/07/19 1654)    Mobility non-ambulatory at this time

## 2019-07-08 NOTE — ED Notes (Signed)
[facility] called for transport. 

## 2019-07-09 ENCOUNTER — Other Ambulatory Visit: Payer: Self-pay

## 2019-07-09 ENCOUNTER — Encounter (HOSPITAL_COMMUNITY): Payer: Self-pay

## 2019-07-09 DIAGNOSIS — K921 Melena: Secondary | ICD-10-CM

## 2019-07-09 DIAGNOSIS — D509 Iron deficiency anemia, unspecified: Secondary | ICD-10-CM

## 2019-07-09 DIAGNOSIS — N183 Chronic kidney disease, stage 3 (moderate): Secondary | ICD-10-CM

## 2019-07-09 DIAGNOSIS — E118 Type 2 diabetes mellitus with unspecified complications: Secondary | ICD-10-CM

## 2019-07-09 DIAGNOSIS — N179 Acute kidney failure, unspecified: Secondary | ICD-10-CM

## 2019-07-09 LAB — CBC WITH DIFFERENTIAL/PLATELET
Abs Immature Granulocytes: 0.17 10*3/uL — ABNORMAL HIGH (ref 0.00–0.07)
Basophils Absolute: 0 10*3/uL (ref 0.0–0.1)
Basophils Relative: 0 %
Eosinophils Absolute: 0.1 10*3/uL (ref 0.0–0.5)
Eosinophils Relative: 1 %
HCT: 23.4 % — ABNORMAL LOW (ref 36.0–46.0)
Hemoglobin: 7.9 g/dL — ABNORMAL LOW (ref 12.0–15.0)
Immature Granulocytes: 2 %
Lymphocytes Relative: 15 %
Lymphs Abs: 1.3 10*3/uL (ref 0.7–4.0)
MCH: 29.4 pg (ref 26.0–34.0)
MCHC: 33.8 g/dL (ref 30.0–36.0)
MCV: 87 fL (ref 80.0–100.0)
Monocytes Absolute: 0.8 10*3/uL (ref 0.1–1.0)
Monocytes Relative: 10 %
Neutro Abs: 6.2 10*3/uL (ref 1.7–7.7)
Neutrophils Relative %: 72 %
Platelets: 186 10*3/uL (ref 150–400)
RBC: 2.69 MIL/uL — ABNORMAL LOW (ref 3.87–5.11)
RDW: 13.9 % (ref 11.5–15.5)
WBC: 8.7 10*3/uL (ref 4.0–10.5)
nRBC: 0.2 % (ref 0.0–0.2)

## 2019-07-09 LAB — IMMUNOFIXATION, URINE

## 2019-07-09 LAB — MPO/PR-3 (ANCA) ANTIBODIES
ANCA Proteinase 3: <3.5 U/mL (ref 0.0–3.5)
Myeloperoxidase Abs: <9 U/mL (ref 0.0–9.0)

## 2019-07-09 LAB — RENAL FUNCTION PANEL
Albumin: 2.4 g/dL — ABNORMAL LOW (ref 3.5–5.0)
Anion gap: 14 (ref 5–15)
BUN: 63 mg/dL — ABNORMAL HIGH (ref 8–23)
CO2: 25 mmol/L (ref 22–32)
Calcium: 7.7 mg/dL — ABNORMAL LOW (ref 8.9–10.3)
Chloride: 103 mmol/L (ref 98–111)
Creatinine, Ser: 3.23 mg/dL — ABNORMAL HIGH (ref 0.44–1.00)
GFR calc Af Amer: 15 mL/min — ABNORMAL LOW (ref >60)
GFR calc non Af Amer: 13 mL/min — ABNORMAL LOW (ref >60)
Glucose, Bld: 226 mg/dL — ABNORMAL HIGH (ref 70–99)
Phosphorus: 3.3 mg/dL (ref 2.5–4.6)
Potassium: 3.4 mmol/L — ABNORMAL LOW (ref 3.5–5.1)
Sodium: 142 mmol/L (ref 135–145)

## 2019-07-09 LAB — IRON AND TIBC
Iron: 25 ug/dL — ABNORMAL LOW (ref 28–170)
Saturation Ratios: 15 % (ref 10.4–31.8)
TIBC: 172 ug/dL — ABNORMAL LOW (ref 250–450)
UIBC: 147 ug/dL

## 2019-07-09 LAB — GLUCOSE, CAPILLARY
Glucose-Capillary: 184 mg/dL — ABNORMAL HIGH (ref 70–99)
Glucose-Capillary: 151 mg/dL — ABNORMAL HIGH (ref 70–99)
Glucose-Capillary: 191 mg/dL — ABNORMAL HIGH (ref 70–99)
Glucose-Capillary: 155 mg/dL — ABNORMAL HIGH (ref 70–99)

## 2019-07-09 LAB — KAPPA/LAMBDA LIGHT CHAINS
Kappa free light chain: 67 mg/L — ABNORMAL HIGH (ref 3.3–19.4)
Kappa, lambda light chain ratio: 0.23 — ABNORMAL LOW (ref 0.26–1.65)
Lambda free light chains: 285.6 mg/L — ABNORMAL HIGH (ref 5.7–26.3)

## 2019-07-09 LAB — ANTI-DNA ANTIBODY, DOUBLE-STRANDED: ds DNA Ab: <1 IU/mL (ref 0–9)

## 2019-07-09 LAB — FERRITIN: Ferritin: 166 ng/mL (ref 11–307)

## 2019-07-09 LAB — CK: Total CK: 873 U/L — ABNORMAL HIGH (ref 38–234)

## 2019-07-09 LAB — C4 COMPLEMENT: Complement C4, Body Fluid: 39 mg/dL (ref 14–44)

## 2019-07-09 LAB — C3 COMPLEMENT: C3 Complement: 154 mg/dL (ref 82–167)

## 2019-07-09 LAB — ANTINUCLEAR ANTIBODIES, IFA: ANA Ab, IFA: NEGATIVE

## 2019-07-09 LAB — ANTISTREPTOLYSIN O TITER: ASO: <20 IU/mL (ref 0.0–200.0)

## 2019-07-09 MED ORDER — SODIUM CHLORIDE 0.9 % IV SOLN
INTRAVENOUS | Status: DC
  Administered 2019-07-09 – 2019-07-12 (×4): via INTRAVENOUS

## 2019-07-09 MED ORDER — CEPHALEXIN 500 MG PO CAPS
500.0000 mg | ORAL_CAPSULE | Freq: Three times a day (TID) | ORAL | Status: DC
  Administered 2019-07-09: 500 mg via ORAL
  Filled 2019-07-09 (×2): qty 1

## 2019-07-09 MED ORDER — SODIUM CHLORIDE 0.9 % IV SOLN
510.0000 mg | Freq: Once | INTRAVENOUS | Status: AC
  Administered 2019-07-09: 510 mg via INTRAVENOUS
  Filled 2019-07-09: qty 17

## 2019-07-09 NOTE — Progress Notes (Signed)
Subjective:  1600 UOP recorded, crt improved nicely - nurse reports slight fever this AM-  She is trying to eat breakfast   Objective Vital signs in last 24 hours: Vitals:   07/08/19 1945 07/08/19 2339 07/09/19 0319 07/09/19 0426  BP: 116/89 (!) 115/53  118/69  Pulse: (!) 106 (!) 101  (!) 102  Resp: 20 18  20   Temp: 98.1 F (36.7 C) 99.2 F (37.3 C)  98.5 F (36.9 C)  TempSrc: Oral Oral  Oral  SpO2: 98% 97%  97%  Weight:   79.4 kg   Height:   5\' 5"  (1.651 m)    Weight change: 0.474 kg  Intake/Output Summary (Last 24 hours) at 07/09/2019 0726 Last data filed at 07/09/2019 1062 Gross per 24 hour  Intake 2876.56 ml  Output 1600 ml  Net 1276.56 ml    Assessment/ Plan: Pt is a 78 y.o. yo female with DM, CKD 3 and HTN on ACE and metformin, ? NSAIDS who was admitted on 07/07/2019 with confusion, AKI with hyperkalemia   Assessment/Plan: 1. Renal- AKI in the setting of ACE and possibly NSAIDS- crt 1.16 on 01/09/18, 1.31 on 06/04/18.   Over 6 this admit.  Presumably due to hemodynamics with meds complicating- crt has trended down with stopping ACE and metformin.   Other labs to rule out etiology are pending- c4 and aso titer are normal, CK high at close to 3000.  Renal function is improving nicely so seems that this is all hemodynamic and meds 2. Metabolic acidosis- due to renal failure and also metformin-  Metformin held - getting sodium bicarb IV- corrected, will stop bicarb drip   3. Hyperkalemia- trended down with medical management 4. Anemia- new, iron stores low, will replete 5. HTN/volume- still seems a little dry to me- will stop bicarb but put on NS- encourage eating and drinking    Louis Meckel    Labs: Basic Metabolic Panel: Recent Labs  Lab 07/07/19 2018 07/08/19 0610 07/09/19 0318  NA 143 143 142  K 6.4* 5.0 3.4*  CL 120* 116* 103  CO2 9* 13* 25  GLUCOSE 118* 161* 226*  BUN 100* 93* 63*  CREATININE 6.07* 5.28* 3.23*  CALCIUM 9.0 8.6* 7.7*  PHOS 6.7* 6.1* 3.3    Liver Function Tests: Recent Labs  Lab 07/07/19 1425 07/07/19 2018 07/08/19 0610 07/09/19 0318  AST 45*  --  37  --   ALT 21  --  20  --   ALKPHOS 120  --  97  --   BILITOT 0.5  --  0.6  --   PROT 7.0  --  5.4*  --   ALBUMIN 3.5 3.5 2.7*  2.8* 2.4*   No results for input(s): LIPASE, AMYLASE in the last 168 hours. No results for input(s): AMMONIA in the last 168 hours. CBC: Recent Labs  Lab 07/07/19 1425 07/09/19 0318  WBC 9.5 8.7  NEUTROABS 7.8* 6.2  HGB 7.8* 7.9*  HCT 26.0* 23.4*  MCV 97.0 87.0  PLT 235 186   Cardiac Enzymes: Recent Labs  Lab 07/07/19 2018 07/09/19 0318  CKTOTAL 2,774* 873*   CBG: Recent Labs  Lab 07/08/19 0609 07/08/19 1120 07/08/19 1614 07/08/19 2104 07/09/19 0615  GLUCAP 144* 119* 154* 241* 184*    Iron Studies:  Recent Labs    07/09/19 0318  IRON 25*  TIBC 172*  FERRITIN 166   Studies/Results: Dg Wrist Complete Right  Result Date: 07/07/2019 CLINICAL DATA:  Wrist pain secondary to a  fall 1 week ago. EXAM: RIGHT WRIST - COMPLETE 3+ VIEW COMPARISON:  None. FINDINGS: There is an impacted fracture metaphysis of the distal right radius. The articular surface of the radius appears to be intact. Minimal lateral angulation. Normal dorsal or volar angulation. There is a fracture through medial aspect of the distal ulna with minimal distraction. Chondrocalcinosis. Arthritic changes at the radial side of the wrist. Slight arthritic changes of the MCP joints. IMPRESSION: Fractures of the distal radius and ulna as described. Electronically Signed   By: Lorriane Shire M.D.   On: 07/07/2019 15:00   US Renal  Result Date: 07/07/2019 CLINICAL DATA:  Acute kidney injury EXAM: RENAL / URINARY TRACT ULTRASOUND COMPLETE COMPARISON:  None. FINDINGS: Right Kidney: Renal measurements: 10.3 x 4.4 x 5.8 cm = volume: 139.2 mL . Echogenicity within normal limits. No mass or hydronephrosis visualized. Left Kidney: Renal measurements: 8.2 x 3.4 x 4 cm = volume:  59.5 mL. Cortical echogenicity within normal limits. No hydronephrosis. Probable mild thinning of the cortex. Bladder: Appears normal for degree of bladder distention. IMPRESSION: 1. Negative for hydronephrosis. 2. Slight asymmetric renal size left smaller than right with possible mild renal cortical thinning/atrophy on the left Electronically Signed   By: Donavan Foil M.D.   On: 07/07/2019 18:14   Dg Chest Port 1 View  Result Date: 07/07/2019 CLINICAL DATA:  Status post left jugular central line placement EXAM: PORTABLE CHEST 1 VIEW COMPARISON:  Film from earlier in the same day. FINDINGS: Cardiac shadow is stable. The lungs are well aerated bilaterally. No focal infiltrate or sizable effusion is seen. No pneumothorax is noted. No bony abnormality is seen. Left jugular central line is noted at the cavoatrial junction. IMPRESSION: No pneumothorax following central line placement. Electronically Signed   By: Inez Catalina M.D.   On: 07/07/2019 23:09   Dg Chest Portable 1 View  Result Date: 07/07/2019 CLINICAL DATA:  Weakness, diarrhea and shortness of breath today. EXAM: PORTABLE CHEST 1 VIEW COMPARISON:  PA and lateral chest 07/05/2010. FINDINGS: Lungs are clear. Heart size is normal. Aortic atherosclerosis noted. No pneumothorax or pleural fluid. No acute or focal bony abnormality. IMPRESSION: No acute disease. Atherosclerosis. Electronically Signed   By: Inge Rise M.D.   On: 07/07/2019 14:56   Medications: Infusions: . cefTRIAXone (ROCEPHIN)  IV Stopped (07/09/19 0025)  .  sodium bicarbonate  infusion 1000 mL 125 mL/hr at 07/09/19 0311    Scheduled Medications: . insulin aspart  0-9 Units Subcutaneous TID WC  . pantoprazole (PROTONIX) IV  40 mg Intravenous Q12H  . sodium chloride flush  3 mL Intravenous Once    have reviewed scheduled and prn medications.  Physical Exam: General: more alert than previously noted.   Heart: RRR Lungs: mostly clear Abdomen: soft, non  tender Extremities: no edema     07/09/2019,7:26 AM  LOS: 2 days

## 2019-07-09 NOTE — Progress Notes (Signed)
Pt is alert and oriented but some slow response. Iron IV provided this am, pt tolerated well, appetite not great, tylenol is covering for her right arm pain so far, have a plan to switch her to chair from bed later this afternoon after her pain subsides, meanwhile PT order placed per verbal order from MD, will continue to monitor the patient  Palma Holter, RN

## 2019-07-09 NOTE — Progress Notes (Signed)
Pt transferred to 5W21 with belongings. Daughter contacted and updated on patients status and new location.  Tressie Ellis, RN

## 2019-07-09 NOTE — Progress Notes (Addendum)
PROGRESS NOTE    Beth Garrison  YBW:[id number] DOB: 1941-01-30 DOA: 07/07/2019 PCP: System, Pcp Not In      Brief Narrative:  Beth Garrison is a 78 y.o. F with DM, HTN, and CKD III baseline Cr 1.2 who presented with weakness, SOB progressive over weeks.  In the ER, found to have creatinine 6.6, K 7.5.    Also in the ER, patient found to have wrist fracture right arm by x-ray (after a fall PTA due to progressive weakness).  She also was noted to have dark stool and anemia.     Assessment & Plan:  Acute renal failure on CKD stage III In setting of NSAID use, cimetidine + metformin/lisinopril/HCTZ use.  Suspect this was medication and hemodynamic induced, rather than differential of MM, ANCA disease, lupus.    UOP yesterday 1.6L, creatinine decreasing nicely.  K normal.  Bicarb improving. -Continue IV fluids -Strict I/Os -Avoid nephrotoxins -Daily BMP     Acute metabolic encephalopathy From AKI, improving.  Rhabdomyolysis CK ~3000 at admission, down to 800 today.  -Trend CK -Hold Crestor    Hyperkalemia Given temporizing measures in ER followed by Lokelma, Lasix, fluids.  K now normalized. -Daily BMP  Diabetes Glucoses well controlled -Hold metformin -Continue SSI correction -Hold gabapentin  Hypertension BP normal -Hold lisinopril and HCTZ  Urinary tract infection Patient had dysuria prior to arrival.  Urine culture on arrival now growing E. coli, pan susceptible. -Discontinue ceftriaxone -Cephalexin 500 TID for 3 more days  Anemia of iron deficiency Baseline Hgb normal in 2017, unknown since.  Presented with anemia, SOB.  Transfused 1 unit in ER.  Suspect melena and UGIB. Hgb stable today since transfusion -Daily CBC -Replace central line with PIV  Melena Beth Garrison notes long history upper GI/gastritis like symptoms.  Noted to have dark stool in ER.  Guiac positive.  All in the settting of aggressive NSAID use PTA.  Possible ulcer, UGIB. -Consult GI,  appreciate expert guidance -Possible EGD Thursday or Friday -Continue pantoprazole -Avoid cimetidine at discharge (interacts with metformin)          MDM and disposition: The below labs and imaging reports were reviewed and summarized above.  Medication management as above.  This is a severe acute illness with threat to life.  She still has severe renal insufficiency, resolving encephalopathy.  The patient was admitted with AKI, hyperkalemia, encephalopathy, anemia and melena.         DVT prophylaxis: SCDs Code Status: FULL Family Communication: Beth Garrison by phone    Consultants:   GI  Nephrology  Procedures:   EGD pending  Central line placement 8/3  Renal US 8/3 FINDINGS: Right Kidney:  Renal measurements: 10.3 x 4.4 x 5.8 cm = volume: 139.2 mL . Echogenicity within normal limits. No mass or hydronephrosis visualized.  Left Kidney:  Renal measurements: 8.2 x 3.4 x 4 cm = volume: 59.5 mL. Cortical echogenicity within normal limits. No hydronephrosis. Probable mild thinning of the cortex.  Bladder:  Appears normal for degree of bladder distention.  IMPRESSION: 1. Negative for hydronephrosis. 2. Slight asymmetric renal size left smaller than right with possible mild renal cortical thinning/atrophy on the left     Antimicrobials:   Ceftriaxone x1 8/4  Cephalexin 8/5 >>    Subjective: Feeling tired.  Weak.  No vomiting.  No melena today.  No cough, fever.  Still somewhat confused, inattentive.    Objective: Vitals:   07/08/19 2339 07/09/19 0319 07/09/19 0426 07/09/19 0734  BP: (!) 115/53  118/69 128/83  Pulse: (!) 101  (!) 102   Resp: 18  20 20   Temp: 99.2 F (37.3 C)  98.5 F (36.9 C) 100 F (37.8 C)  TempSrc: Oral  Oral Oral  SpO2: 97%  97% 98%  Weight:  79.4 kg    Height:  5\' 5"  (1.651 m)      Intake/Output Summary (Last 24 hours) at 07/09/2019 0914 Last data filed at 07/09/2019 0427 Gross per 24 hour  Intake 2876.56 ml   Output 1600 ml  Net 1276.56 ml   Filed Weights   07/07/19 1427 07/08/19 0500 07/09/19 0319  Weight: 78.9 kg 77.8 kg 79.4 kg    Examination: General appearance:  adult female, awake, in no acute distress.   HEENT: Anicteric, conjunctiva pale, lids and lashes normal. No nasal deformity, discharge, epistaxis.  Lips moist, OP tacky dry, no oral lesions, hearing normal.   Skin: Warm and dry.  Pale, no jaundice.  No suspicious rashes or lesions. Cardiac: Tachycardic, regular, nl S1-S2, no murmurs appreciated.  Capillary refill is brisk.  JVP normal.  No LE edema.  Radial pulses 2+ and symmetric. Respiratory: Normal respiratory rate and rhythm.  CTAB without rales or wheezes. Abdomen: Abdomen soft.  No TTP or guarding. No ascites, distension, hepatosplenomegaly.   MSK: No deformities or effusions. Neuro: Awake, psychomotor slowing obvious.  EOMI, moves all extremities very weakly but symmetrically. Speech fluent.    Psych: Sensorium intact and responding to questions, attention siminished. Affect blunted.  Judgment and insight appear normal.    Data Reviewed: I have personally reviewed following labs and imaging studies:  CBC: Recent Labs  Lab 07/07/19 1425 07/09/19 0318  WBC 9.5 8.7  NEUTROABS 7.8* 6.2  HGB 7.8* 7.9*  HCT 26.0* 23.4*  MCV 97.0 87.0  PLT 235 166   Basic Metabolic Panel: Recent Labs  Lab 07/07/19 1425 07/07/19 1739 07/07/19 2018 07/08/19 0610 07/09/19 0318  NA 139  --  143 143 142  K >7.5* 6.4* 6.4* 5.0 3.4*  CL 116*  --  120* 116* 103  CO2 9*  --  9* 13* 25  GLUCOSE 154*  --  118* 161* 226*  BUN 116*  --  100* 93* 63*  CREATININE 6.67*  --  6.07* 5.28* 3.23*  CALCIUM 8.9  --  9.0 8.6* 7.7*  PHOS  --   --  6.7* 6.1* 3.3   GFR: Estimated Creatinine Clearance: 15.4 mL/min (A) (by C-G formula based on SCr of 3.23 mg/dL (H)). Liver Function Tests: Recent Labs  Lab 07/07/19 1425 07/07/19 2018 07/08/19 0610 07/09/19 0318  AST 45*  --  37  --   ALT  21  --  20  --   ALKPHOS 120  --  97  --   BILITOT 0.5  --  0.6  --   PROT 7.0  --  5.4*  --   ALBUMIN 3.5 3.5 2.7*   2.8* 2.4*   No results for input(s): LIPASE, AMYLASE in the last 168 hours. No results for input(s): AMMONIA in the last 168 hours. Coagulation Profile: Recent Labs  Lab 07/07/19 1425  INR 1.4*   Cardiac Enzymes: Recent Labs  Lab 07/07/19 2018 07/09/19 0318  CKTOTAL 2,774* 873*   BNP (last 3 results) No results for input(s): PROBNP in the last 8760 hours. HbA1C: Recent Labs    07/07/19 2018  HGBA1C 5.5   CBG: Recent Labs  Lab 07/08/19 0609 07/08/19 1120 07/08/19 1614 07/08/19 2104 07/09/19 0615  GLUCAP  144* 119* 154* 241* 184*   Lipid Profile: No results for input(s): CHOL, HDL, LDLCALC, TRIG, CHOLHDL, LDLDIRECT in the last 72 hours. Thyroid Function Tests: No results for input(s): TSH, T4TOTAL, FREET4, T3FREE, THYROIDAB in the last 72 hours. Anemia Panel: Recent Labs    07/07/19 1418 07/09/19 0318  VITAMINB12 198  --   FOLATE 8.8  --   FERRITIN 168 166  TIBC 229* 172*  IRON 62 25*  RETICCTPCT 1.8  --    Urine analysis:    Component Value Date/Time   COLORURINE YELLOW 07/07/2019 1425   APPEARANCEUR CLOUDY (A) 07/07/2019 1425   LABSPEC 1.012 07/07/2019 1425   PHURINE 7.0 07/07/2019 1425   GLUCOSEU NEGATIVE 07/07/2019 1425   GLUCOSEU NEGATIVE 12/31/2014 1145   HGBUR MODERATE (A) 07/07/2019 1425   BILIRUBINUR NEGATIVE 07/07/2019 1425   KETONESUR NEGATIVE 07/07/2019 1425   PROTEINUR 100 (A) 07/07/2019 1425   UROBILINOGEN 0.2 12/31/2014 1145   NITRITE NEGATIVE 07/07/2019 1425   LEUKOCYTESUR MODERATE (A) 07/07/2019 1425   Sepsis Labs: @LABRCNTIP (procalcitonin:4,lacticacidven:4)  ) Recent Results (from the past 240 hour(s))  Culture, blood (Routine x 2)     Status: None (Preliminary result)   Collection Time: 07/07/19  2:25 PM   Specimen: BLOOD  Result Value Ref Range Status   Specimen Description   Final    BLOOD LEFT  ANTECUBITAL Performed at [facility], [place] [address], [place], [place] [address]    Special Requests   Final    BOTTLES DRAWN AEROBIC AND ANAEROBIC Blood Culture results may not be optimal due to an excessive volume of blood received in culture bottles Performed at [facility] [address] [place], [place] [address]    Culture   Final    NO GROWTH < 24 HOURS Performed at [facility] [address], [place], [place] [address]    Report Status PENDING  Incomplete  Culture, blood (Routine x 2)     Status: None (Preliminary result)   Collection Time: 07/07/19  2:25 PM   Specimen: BLOOD LEFT HAND  Result Value Ref Range Status   Specimen Description   Final    BLOOD LEFT HAND Performed at [facility] [address] [place], [place] [address]    Special Requests   Final    BOTTLES DRAWN AEROBIC AND ANAEROBIC Blood Culture results may not be optimal due to an excessive volume of blood received in culture bottles Performed at [facility] [address] [place], [place] [address]    Culture  Setup Time   Final    GRAM POSITIVE COCCI AEROBIC BOTTLE ONLY Organism ID to follow CRITICAL RESULT CALLED TO, READ BACK BY AND VERIFIED WITH: Dawson Bills PharmD 15:40 07/08/19 ([email or web address])    Culture   Final    NO GROWTH < 24 HOURS Performed at [facility], [address], [place], [place] [address]    Report Status PENDING  Incomplete  Blood Culture ID Panel (Reflexed)     Status: Abnormal   Collection Time: 07/07/19  2:25 PM  Result Value Ref Range Status   Enterococcus species NOT DETECTED NOT DETECTED Final   Listeria monocytogenes NOT DETECTED NOT DETECTED Final   Staphylococcus species DETECTED (A) NOT DETECTED Final    Comment: Methicillin (oxacillin) susceptible coagulase negative staphylococcus. Possible blood culture contaminant (unless isolated from more than one blood culture draw or  clinical case suggests pathogenicity). No antibiotic treatment is indicated for  blood  culture contaminants. CRITICAL RESULT CALLED TO, READ BACK BY AND VERIFIED WITH: Dawson Bills PharmD 15:40 07/08/19 ([email or web address])    Staphylococcus aureus (BCID) NOT DETECTED NOT DETECTED Final   Methicillin resistance NOT DETECTED NOT DETECTED Final   Streptococcus species NOT DETECTED NOT DETECTED Final   Streptococcus agalactiae NOT DETECTED NOT DETECTED Final   Streptococcus pneumoniae NOT DETECTED NOT DETECTED Final   Streptococcus pyogenes NOT DETECTED NOT DETECTED Final   Acinetobacter baumannii NOT DETECTED NOT DETECTED Final   Enterobacteriaceae species NOT DETECTED NOT DETECTED Final   Enterobacter cloacae complex NOT DETECTED NOT DETECTED Final   Escherichia coli NOT DETECTED NOT DETECTED Final   Klebsiella oxytoca NOT DETECTED NOT DETECTED Final   Klebsiella pneumoniae NOT DETECTED NOT DETECTED Final   Proteus species NOT DETECTED NOT DETECTED Final   Serratia marcescens NOT DETECTED NOT DETECTED Final   Haemophilus influenzae NOT DETECTED NOT DETECTED Final   Neisseria meningitidis NOT DETECTED NOT DETECTED Final   Pseudomonas aeruginosa NOT DETECTED NOT DETECTED Final   Candida albicans NOT DETECTED NOT DETECTED Final   Candida glabrata NOT DETECTED NOT DETECTED Final   Candida krusei NOT DETECTED NOT DETECTED Final   Candida parapsilosis NOT DETECTED NOT DETECTED Final   Candida tropicalis NOT DETECTED NOT DETECTED Final    Comment: Performed at [facility], [address] [place], [place] [address]  Urine culture     Status: Abnormal (Preliminary result)   Collection Time: 07/07/19  2:31 PM   Specimen: Urine, Random  Result Value Ref Range Status   Specimen Description URINE, RANDOM  Final   Special Requests NONE  Final   Culture >=100,000 COLONIES/mL ESCHERICHIA COLI (A)  Final   Report Status PENDING  Incomplete   Organism ID, Bacteria ESCHERICHIA COLI (A)  Final       Susceptibility   Escherichia coli - MIC*    AMPICILLIN <=2 SENSITIVE Sensitive     CEFAZOLIN <=4 SENSITIVE Sensitive     CEFTRIAXONE <=1 SENSITIVE Sensitive     CIPROFLOXACIN <=0.25 SENSITIVE Sensitive     GENTAMICIN <=1 SENSITIVE Sensitive     IMIPENEM <=0.25 SENSITIVE Sensitive     NITROFURANTOIN <=16 SENSITIVE Sensitive     TRIMETH/SULFA <=20 SENSITIVE Sensitive     AMPICILLIN/SULBACTAM <=2 SENSITIVE Sensitive     PIP/TAZO <=4 SENSITIVE Sensitive     Extended ESBL Value in next row Sensitive      NEGATIVEPerformed at [facility] [address], [place], [place] [address]    * >=100,000 COLONIES/mL ESCHERICHIA COLI  SARS Coronavirus 2 [facility] order, Performed in [facility] hospital lab) Nasopharyngeal Nasopharyngeal Swab     Status: None   Collection Time: 07/07/19  2:31 PM   Specimen: Nasopharyngeal Swab  Result Value Ref Range Status   SARS Coronavirus 2 NEGATIVE NEGATIVE Final    Comment: (NOTE) If result is NEGATIVE SARS-CoV-2 target nucleic acids are NOT DETECTED. The SARS-CoV-2 RNA is generally detectable in upper and lower  respiratory specimens during the acute phase of infection. The lowest  concentration of SARS-CoV-2 viral copies this assay can detect is 250  copies / mL. A negative result does not preclude SARS-CoV-2 infection  and should not be used as the sole basis for treatment or other  patient management decisions.  A negative result may occur with  improper specimen collection / handling, submission of specimen other  than nasopharyngeal swab, presence of viral mutation(s) within the  areas targeted by this assay, and  inadequate number of viral copies  (<250 copies / mL). A negative result must be combined with clinical  observations, patient history, and epidemiological information. If result is POSITIVE SARS-CoV-2 target nucleic acids are DETECTED. The SARS-CoV-2 RNA is generally detectable in upper and lower  respiratory specimens dur ing the  acute phase of infection.  Positive  results are indicative of active infection with SARS-CoV-2.  Clinical  correlation with patient history and other diagnostic information is  necessary to determine patient infection status.  Positive results do  not rule out bacterial infection or co-infection with other viruses. If result is PRESUMPTIVE POSTIVE SARS-CoV-2 nucleic acids MAY BE PRESENT.   A presumptive positive result was obtained on the submitted specimen  and confirmed on repeat testing.  While 2019 novel coronavirus  (SARS-CoV-2) nucleic acids may be present in the submitted sample  additional confirmatory testing may be necessary for epidemiological  and / or clinical management purposes  to differentiate between  SARS-CoV-2 and other Sarbecovirus currently known to infect humans.  If clinically indicated additional testing with an alternate test  methodology [phone]) is advised. The SARS-CoV-2 RNA is generally  detectable in upper and lower respiratory sp ecimens during the acute  phase of infection. The expected result is Negative. Fact Sheet for Patients:  StrictlyIdeas.no Fact Sheet for Healthcare Providers: BankingDealers.co.za This test is not yet approved or cleared by the [place] FDA and has been authorized for detection and/or diagnosis of SARS-CoV-2 by FDA under an Emergency Use Authorization (EUA).  This EUA will remain in effect (meaning this test can be used) for the duration of the COVID-19 declaration under Section 564(b)(1) of the Act, 21 U.S.C. section 360bbb-3(b)(1), unless the authorization is terminated or revoked sooner. Performed at [facility], [place] [address] [place], [place] [address]   MRSA PCR Screening     Status: None   Collection Time: 07/08/19  5:40 AM   Specimen: Nasal Mucosa; Nasopharyngeal  Result Value Ref Range Status   MRSA by PCR NEGATIVE NEGATIVE Final    Comment:         The GeneXpert MRSA Assay (FDA approved for NASAL specimens only), is one component of a comprehensive MRSA colonization surveillance program. It is not intended to diagnose MRSA infection nor to guide or monitor treatment for MRSA infections. Performed at [facility], [place] [address] [place], [place] [address]          Radiology Studies: Dg Wrist Complete Right  Result Date: 07/07/2019 CLINICAL DATA:  Wrist pain secondary to a fall 1 week ago. EXAM: RIGHT WRIST - COMPLETE 3+ VIEW COMPARISON:  None. FINDINGS: There is an impacted fracture metaphysis of the distal right radius. The articular surface of the radius appears to be intact. Minimal lateral angulation. Normal dorsal or volar angulation. There is a fracture through medial aspect of the distal ulna with minimal distraction. Chondrocalcinosis. Arthritic changes at the radial side of the wrist. Slight arthritic changes of the MCP joints. IMPRESSION: Fractures of the distal radius and ulna as described. Electronically Signed   By: Lorriane Shire M.D.   On: 07/07/2019 15:00   US Renal  Result Date: 07/07/2019 CLINICAL DATA:  Acute kidney injury EXAM: RENAL / URINARY TRACT ULTRASOUND COMPLETE COMPARISON:  None. FINDINGS: Right Kidney: Renal measurements: 10.3 x 4.4 x 5.8 cm = volume: 139.2 mL . Echogenicity within normal limits. No mass or hydronephrosis visualized. Left Kidney: Renal measurements: 8.2 x 3.4 x 4 cm = volume: 59.5  mL. Cortical echogenicity within normal limits. No hydronephrosis. Probable mild thinning of the cortex. Bladder: Appears normal for degree of bladder distention. IMPRESSION: 1. Negative for hydronephrosis. 2. Slight asymmetric renal size left smaller than right with possible mild renal cortical thinning/atrophy on the left Electronically Signed   By: Donavan Foil M.D.   On: 07/07/2019 18:14   Dg Chest Port 1 View  Result Date: 07/07/2019 CLINICAL DATA:  Status post left jugular central line  placement EXAM: PORTABLE CHEST 1 VIEW COMPARISON:  Film from earlier in the same day. FINDINGS: Cardiac shadow is stable. The lungs are well aerated bilaterally. No focal infiltrate or sizable effusion is seen. No pneumothorax is noted. No bony abnormality is seen. Left jugular central line is noted at the cavoatrial junction. IMPRESSION: No pneumothorax following central line placement. Electronically Signed   By: Inez Catalina M.D.   On: 07/07/2019 23:09   Dg Chest Portable 1 View  Result Date: 07/07/2019 CLINICAL DATA:  Weakness, diarrhea and shortness of breath today. EXAM: PORTABLE CHEST 1 VIEW COMPARISON:  PA and lateral chest 07/05/2010. FINDINGS: Lungs are clear. Heart size is normal. Aortic atherosclerosis noted. No pneumothorax or pleural fluid. No acute or focal bony abnormality. IMPRESSION: No acute disease. Atherosclerosis. Electronically Signed   By: Inge Rise M.D.   On: 07/07/2019 14:56        Scheduled Meds:  insulin aspart  0-9 Units Subcutaneous TID WC   pantoprazole (PROTONIX) IV  40 mg Intravenous Q12H   sodium chloride flush  3 mL Intravenous Once   Continuous Infusions:  sodium chloride 75 mL/hr at 07/09/19 0744   cefTRIAXone (ROCEPHIN)  IV Stopped (07/09/19 0025)     LOS: 2 days    Time spent: 35 minutes    Edwin Dada, MD [facility] 07/09/2019, 9:14 AM     Please page through [place]:  [email or web address] Password [email or web address] If 7PM-7AM, please contact night-coverage

## 2019-07-09 NOTE — Plan of Care (Signed)
  Problem: Education: Goal: Knowledge of General Education information will improve Description: Including pain rating scale, medication(s)/side effects and non-pharmacologic comfort measures 07/09/2019 0903 by Neil Crouch, RN Outcome: Progressing 07/09/2019 0903 by Neil Crouch, RN Outcome: Progressing   Problem: Health Behavior/Discharge Planning: Goal: Ability to manage health-related needs will improve 07/09/2019 0903 by Neil Crouch, RN Outcome: Progressing 07/09/2019 0903 by Neil Crouch, RN Outcome: Progressing   Problem: Clinical Measurements: Goal: Ability to maintain clinical measurements within normal limits will improve 07/09/2019 0903 by Neil Crouch, RN Outcome: Progressing 07/09/2019 0903 by Neil Crouch, RN Outcome: Progressing Goal: Will remain free from infection 07/09/2019 0903 by Neil Crouch, RN Outcome: Progressing 07/09/2019 0903 by Neil Crouch, RN Outcome: Progressing Goal: Diagnostic test results will improve 07/09/2019 0903 by Neil Crouch, RN Outcome: Progressing 07/09/2019 0903 by Neil Crouch, RN Outcome: Progressing Goal: Respiratory complications will improve 07/09/2019 0903 by Neil Crouch, RN Outcome: Progressing 07/09/2019 0903 by Neil Crouch, RN Outcome: Progressing Goal: Cardiovascular complication will be avoided 07/09/2019 0903 by Neil Crouch, RN Outcome: Progressing 07/09/2019 0903 by Neil Crouch, RN Outcome: Progressing   Problem: Activity: Goal: Risk for activity intolerance will decrease 07/09/2019 0903 by Neil Crouch, RN Outcome: Progressing 07/09/2019 0903 by Neil Crouch, RN Outcome: Progressing   Problem: Nutrition: Goal: Adequate nutrition will be maintained 07/09/2019 0903 by Neil Crouch, RN Outcome: Progressing 07/09/2019 0903 by Neil Crouch, RN Outcome: Progressing   Problem: Coping: Goal: Level of anxiety will decrease 07/09/2019 0903 by Neil Crouch, RN Outcome: Progressing 07/09/2019 0903 by Neil Crouch, RN Outcome: Progressing    Problem: Elimination: Goal: Will not experience complications related to bowel motility 07/09/2019 0903 by Neil Crouch, RN Outcome: Progressing 07/09/2019 0903 by Neil Crouch, RN Outcome: Progressing Goal: Will not experience complications related to urinary retention 07/09/2019 0903 by Neil Crouch, RN Outcome: Progressing 07/09/2019 0903 by Neil Crouch, RN Outcome: Progressing   Problem: Pain Managment: Goal: General experience of comfort will improve 07/09/2019 0903 by Neil Crouch, RN Outcome: Progressing 07/09/2019 0903 by Neil Crouch, RN Outcome: Progressing   Problem: Safety: Goal: Ability to remain free from injury will improve 07/09/2019 0903 by Neil Crouch, RN Outcome: Progressing 07/09/2019 0903 by Neil Crouch, RN Outcome: Progressing   Problem: Skin Integrity: Goal: Risk for impaired skin integrity will decrease 07/09/2019 0903 by Neil Crouch, RN Outcome: Progressing 07/09/2019 0903 by Neil Crouch, RN Outcome: Progressing

## 2019-07-09 NOTE — Progress Notes (Signed)
Subjective: Patient is a 78 year old white female with a history of diabetes hypertension chronic kidney disease stage III with baseline creatinine of 1.2 who was admitted to the hospital weakness shortness of breath and was found to have a creatinine of 6.7 with a potassium of 7.5K in the ER along with a wrist fracture from a fall due to progressive weakness she complained of dark stools and anemia.  She is found to have guaiac positive stools in the emergency room.  Her acute renal failure was thought to be due to multiple medication she was on including nonsteroidals like ibuprofen cimetidine metformin lisinopril and HCTZ creatinine has improved slowly and her potassium has normalized bicarb is improving.  Her acute metabolic encephalopathy from the acute kidney injury has improved slightly as well.  She is somewhat slow to respond but seems to be aware of her surroundings and answers questions appropriately.  She is awaiting a GI evaluation for her guaiac positive stool and anemia once her acute symptoms improve.  She denies having any abdominal pain nausea vomiting at this time she has had 2 loose BMs yesterday.  Objective: Vital signs in last 24 hours: Temp:  [98.1 F (36.7 C)-100 F (37.8 C)] 98.1 F (36.7 C) (08/05 1538) Pulse Rate:  [87-106] 96 (08/05 1538) Resp:  [16-21] 21 (08/05 1538) BP: (115-128)/(41-89) 121/41 (08/05 1538) SpO2:  [95 %-98 %] 95 % (08/05 1538) Weight:  [79.4 kg] 79.4 kg (08/05 0319) Last BM Date: 07/05/19  Intake/Output from previous day: 08/04 0701 - 08/05 0700 In: 2876.6 [P.O.:240; I.V.:2536.3; IV Piggyback:100.2] Out: 1600 [Urine:1600] Intake/Output this shift: Total I/O In: 1222.6 [P.O.:622; I.V.:600.6] Out: 600 [Urine:600]  General appearance: cooperative, appears stated age, distracted, fatigued, no distress and mildly obese Resp: clear to auscultation bilaterally Cardio: regular rate and rhythm, S1, S2 normal, no murmur, click, rub or gallop GI: soft,  non-tender; bowel sounds normal; no masses,  no organomegaly  Lab Results: Recent Labs    07/07/19 1425 07/09/19 0318  WBC 9.5 8.7  HGB 7.8* 7.9*  HCT 26.0* 23.4*  PLT 235 186   BMET Recent Labs    07/07/19 2018 07/08/19 0610 07/09/19 0318  NA 143 143 142  K 6.4* 5.0 3.4*  CL 120* 116* 103  CO2 9* 13* 25  GLUCOSE 118* 161* 226*  BUN 100* 93* 63*  CREATININE 6.07* 5.28* 3.23*  CALCIUM 9.0 8.6* 7.7*   LFT Recent Labs    07/08/19 0610 07/09/19 0318  PROT 5.4*  --   ALBUMIN 2.7*  2.8* 2.4*  AST 37  --   ALT 20  --   ALKPHOS 97  --   BILITOT 0.6  --   BILIDIR <0.1  --   IBILI NOT CALCULATED  --    PT/INR Recent Labs    07/07/19 1425  LABPROT 17.4*  INR 1.4*   Studies/Results: US Renal  Result Date: 07/07/2019 CLINICAL DATA:  Acute kidney injury EXAM: RENAL / URINARY TRACT ULTRASOUND COMPLETE COMPARISON:  None. FINDINGS: Right Kidney: Renal measurements: 10.3 x 4.4 x 5.8 cm = volume: 139.2 mL . Echogenicity within normal limits. No mass or hydronephrosis visualized. Left Kidney: Renal measurements: 8.2 x 3.4 x 4 cm = volume: 59.5 mL. Cortical echogenicity within normal limits. No hydronephrosis. Probable mild thinning of the cortex. Bladder: Appears normal for degree of bladder distention. IMPRESSION: 1. Negative for hydronephrosis. 2. Slight asymmetric renal size left smaller than right with possible mild renal cortical thinning/atrophy on the left Electronically Signed  By: Donavan Foil M.D.   On: 07/07/2019 18:14   Dg Chest Port 1 View  Result Date: 07/07/2019 CLINICAL DATA:  Status post left jugular central line placement EXAM: PORTABLE CHEST 1 VIEW COMPARISON:  Film from earlier in the same day. FINDINGS: Cardiac shadow is stable. The lungs are well aerated bilaterally. No focal infiltrate or sizable effusion is seen. No pneumothorax is noted. No bony abnormality is seen. Left jugular central line is noted at the cavoatrial junction. IMPRESSION: No pneumothorax  following central line placement. Electronically Signed   By: Inez Catalina M.D.   On: 07/07/2019 23:09   Medications: I have reviewed the patient's current medications.  Assessment/Plan: 1) Iron deficiency anemia with guaiac positive stools and reflux-we will we will plan to do an EGD tomorrow make further recommendations thereafter. 2) Acute kidney injury second to multiple medications 3) Acute metabolic encephalopathy secondary to AKI. 4) Hypertension/diabetes. 5) UTI. 6) Rhabdomyolysis improving CK down to 800 today.  LOS: 2 days   Juanita Craver 07/09/2019, 5:22 PM

## 2019-07-10 ENCOUNTER — Encounter (HOSPITAL_COMMUNITY): Payer: Self-pay | Admitting: Emergency Medicine

## 2019-07-10 ENCOUNTER — Encounter (HOSPITAL_COMMUNITY)
Admission: EM | Disposition: A | Discharge status: Skilled Nursing Facility | Payer: Medicare Other | Source: Home / Self Care | Attending: Internal Medicine

## 2019-07-10 ENCOUNTER — Other Ambulatory Visit: Payer: Self-pay

## 2019-07-10 DIAGNOSIS — R748 Abnormal levels of other serum enzymes: Secondary | ICD-10-CM

## 2019-07-10 DIAGNOSIS — I10 Essential (primary) hypertension: Secondary | ICD-10-CM

## 2019-07-10 HISTORY — PX: BIOPSY: SHX5522

## 2019-07-10 HISTORY — PX: ESOPHAGOGASTRODUODENOSCOPY (EGD) WITH PROPOFOL: SHX5813

## 2019-07-10 LAB — RENAL FUNCTION PANEL
Albumin: 2.1 g/dL — ABNORMAL LOW (ref 3.5–5.0)
Anion gap: 10 (ref 5–15)
BUN: 45 mg/dL — ABNORMAL HIGH (ref 8–23)
CO2: 25 mmol/L (ref 22–32)
Calcium: 7.2 mg/dL — ABNORMAL LOW (ref 8.9–10.3)
Chloride: 105 mmol/L (ref 98–111)
Creatinine, Ser: 2.5 mg/dL — ABNORMAL HIGH (ref 0.44–1.00)
GFR calc Af Amer: 21 mL/min — ABNORMAL LOW (ref >60)
GFR calc non Af Amer: 18 mL/min — ABNORMAL LOW (ref >60)
Glucose, Bld: 143 mg/dL — ABNORMAL HIGH (ref 70–99)
Phosphorus: 3.8 mg/dL (ref 2.5–4.6)
Potassium: 3.2 mmol/L — ABNORMAL LOW (ref 3.5–5.1)
Sodium: 140 mmol/L (ref 135–145)

## 2019-07-10 LAB — CULTURE, BLOOD (ROUTINE X 2)

## 2019-07-10 LAB — CBC WITH DIFFERENTIAL/PLATELET
Abs Immature Granulocytes: 0.24 10*3/uL — ABNORMAL HIGH (ref 0.00–0.07)
Basophils Absolute: 0 10*3/uL (ref 0.0–0.1)
Basophils Relative: 0 %
Eosinophils Absolute: 0.1 10*3/uL (ref 0.0–0.5)
Eosinophils Relative: 1 %
HCT: 22.9 % — ABNORMAL LOW (ref 36.0–46.0)
Hemoglobin: 7.4 g/dL — ABNORMAL LOW (ref 12.0–15.0)
Immature Granulocytes: 3 %
Lymphocytes Relative: 17 %
Lymphs Abs: 1.6 10*3/uL (ref 0.7–4.0)
MCH: 29.2 pg (ref 26.0–34.0)
MCHC: 32.3 g/dL (ref 30.0–36.0)
MCV: 90.5 fL (ref 80.0–100.0)
Monocytes Absolute: 0.8 10*3/uL (ref 0.1–1.0)
Monocytes Relative: 9 %
Neutro Abs: 6.3 10*3/uL (ref 1.7–7.7)
Neutrophils Relative %: 70 %
Platelets: 167 10*3/uL (ref 150–400)
RBC: 2.53 MIL/uL — ABNORMAL LOW (ref 3.87–5.11)
RDW: 14 % (ref 11.5–15.5)
WBC: 9 10*3/uL (ref 4.0–10.5)
nRBC: 0 % (ref 0.0–0.2)

## 2019-07-10 LAB — URINE CULTURE: Culture: >=100000 — AB

## 2019-07-10 LAB — IMMUNOFIXATION ELECTROPHORESIS
IgA: 106 mg/dL (ref 64–422)
IgG (Immunoglobin G), Serum: 693 mg/dL (ref 586–1602)
IgM (Immunoglobulin M), Srm: 26 mg/dL (ref 26–217)
Total Protein ELP: 6.3 g/dL (ref 6.0–8.5)

## 2019-07-10 LAB — CK: Total CK: 428 U/L — ABNORMAL HIGH (ref 38–234)

## 2019-07-10 LAB — GLUCOSE, CAPILLARY
Glucose-Capillary: 119 mg/dL — ABNORMAL HIGH (ref 70–99)
Glucose-Capillary: 126 mg/dL — ABNORMAL HIGH (ref 70–99)
Glucose-Capillary: 116 mg/dL — ABNORMAL HIGH (ref 70–99)
Glucose-Capillary: 183 mg/dL — ABNORMAL HIGH (ref 70–99)

## 2019-07-10 SURGERY — ESOPHAGOGASTRODUODENOSCOPY (EGD) WITH PROPOFOL
Anesthesia: Moderate Sedation | Laterality: Left

## 2019-07-10 MED ORDER — SODIUM CHLORIDE 0.9 % IV SOLN
510.0000 mg | Freq: Once | INTRAVENOUS | Status: AC
  Administered 2019-07-12: 510 mg via INTRAVENOUS
  Filled 2019-07-10: qty 17

## 2019-07-10 MED ORDER — MIDAZOLAM HCL 2 MG/2ML IJ SOLN
INTRAMUSCULAR | Status: DC | PRN
  Administered 2019-07-10 (×2): 2 mg via INTRAVENOUS

## 2019-07-10 MED ORDER — CEPHALEXIN 500 MG PO CAPS
500.0000 mg | ORAL_CAPSULE | Freq: Two times a day (BID) | ORAL | Status: AC
  Administered 2019-07-10 – 2019-07-14 (×8): 500 mg via ORAL
  Filled 2019-07-10 (×8): qty 1

## 2019-07-10 MED ORDER — MIDAZOLAM HCL (PF) 5 MG/ML IJ SOLN
INTRAMUSCULAR | Status: AC
  Filled 2019-07-10: qty 2

## 2019-07-10 MED ORDER — FENTANYL CITRATE (PF) 100 MCG/2ML IJ SOLN
INTRAMUSCULAR | Status: AC
  Filled 2019-07-10: qty 2

## 2019-07-10 MED ORDER — BUTAMBEN-TETRACAINE-BENZOCAINE 2-2-14 % EX AERO
INHALATION_SPRAY | CUTANEOUS | Status: DC | PRN
  Administered 2019-07-10: 2 via TOPICAL

## 2019-07-10 MED ORDER — DIPHENHYDRAMINE HCL 50 MG/ML IJ SOLN
INTRAMUSCULAR | Status: AC
  Filled 2019-07-10: qty 1

## 2019-07-10 MED ORDER — SODIUM CHLORIDE 0.9 % IV SOLN
INTRAVENOUS | Status: DC
  Administered 2019-07-10: 14:00:00 via INTRAVENOUS

## 2019-07-10 MED ORDER — FENTANYL CITRATE (PF) 100 MCG/2ML IJ SOLN
INTRAMUSCULAR | Status: DC | PRN
  Administered 2019-07-10 (×2): 25 ug via INTRAVENOUS

## 2019-07-10 SURGICAL SUPPLY — 14 items

## 2019-07-10 NOTE — Evaluation (Signed)
Physical Therapy Evaluation Patient Details Name: Elanie Hammitt MRN: [id number] DOB: 1941-10-16 Today's Date: 07/10/2019   History of Present Illness  78 yo female with mult recent falls is admitted with R radial and ulnar fracture, placed in splint.  Hypoxic and placed on 2L O2.  Has acute encephalopathy, renal failure and anemia with hgb currently 7.4.  Central line placed, referred to PT for rehab assess.   PMHx:  atherosclerosis, AKI, DM, HTN  Clinical Impression  Pt was assessed and afterward obtained permission from MD to use R elbow for WB only to allow a platform walker.  Pt is weak, requiring help to sit up and could not stand with PT using just LUE and walker to wb only there.  Pt is vomiting when up but feels NPO is a problem as she awaits her procedure.  Follow up to continue mobility and progress as tolerated, with SNF planned as she is currently a 2 person assist with care for LUE.  Follow acutely for the plan to work on LE strengthening and balance, and progress to gait as able.    Follow Up Recommendations SNF    Equipment Recommendations  None recommended by PT(await disposition from SNF)    Recommendations for Other Services       Precautions / Restrictions Precautions Precautions: Fall Precaution Comments: NWB on R wrist(permitted to WB on R elbow for plaform walker use) Required Braces or Orthoses: Splint/Cast(R wrist) Splint/Cast: R wrist, NWB presumed Restrictions Weight Bearing Restrictions: No(has R wrist fracture, NWB presumed)      Mobility  Bed Mobility Overal bed mobility: Needs Assistance Bed Mobility: Supine to Sit;Sit to Supine     Supine to sit: Min assist Sit to supine: Mod assist   General bed mobility comments: min to support trunk up and mod to return legs to bed  Transfers Overall transfer level: Needs assistance Equipment used: Rolling walker (2 wheeled);1 person hand held assist Transfers: Sit to/from Stand;Lateral/Scoot Transfers Sit  to Stand: Max assist;From elevated surface        Lateral/Scoot Transfers: Mod assist(with bed pad) General transfer comment: pt was instructed to use just LUE to push off bed and cannot stand up even with more direct help from PT.  No effort with LE's was elicited initially  Ambulation/Gait             General Gait Details: unable  Stairs            Wheelchair Mobility    Modified Rankin (Stroke Patients Only)       Balance Overall balance assessment: History of Falls;Needs assistance Sitting-balance support: Feet supported;Single extremity supported Sitting balance-Leahy Scale: Fair                                       Pertinent Vitals/Pain Pain Assessment: 0-10 Pain Score: 7  Pain Location: R arm Pain Descriptors / Indicators: Grimacing Pain Intervention(s): Limited activity within patient's tolerance;Monitored during session;Repositioned    Home Living Family/patient expects to be discharged to:: Private residence Living Arrangements: Children;Other relatives Available Help at Discharge: Family;Available PRN/intermittently Type of Home: House       Home Layout: One level Home Equipment: None Additional Comments: pt had begun to be non ambulatory but has no equipment at home    Prior Function Level of Independence: Independent         Comments: no AD and lives with family  PRN available, could do all self care     Hand Dominance   Dominant Hand: Right    Extremity/Trunk Assessment   Upper Extremity Assessment Upper Extremity Assessment: RUE deficits/detail RUE Deficits / Details: distal R colles fracture, splint in place RUE: Unable to fully assess due to immobilization;Unable to fully assess due to pain RUE Coordination: decreased fine motor;decreased gross motor    Lower Extremity Assessment Lower Extremity Assessment: Generalized weakness    Cervical / Trunk Assessment Cervical / Trunk Assessment: Normal   Communication   Communication: No difficulties  Cognition Arousal/Alertness: Awake/alert Behavior During Therapy: Flat affect Overall Cognitive Status: Within Functional Limits for tasks assessed                                 General Comments: pt is having nausea, NPO for procedure      General Comments General comments (skin integrity, edema, etc.): pt is redirected as she is automatically reaching to use RUE to push off bed, but with only LUE and legs is unable to make much effort to stand    Exercises     Assessment/Plan    PT Assessment Patient needs continued PT services  PT Problem List Decreased strength;Decreased activity tolerance;Decreased range of motion;Decreased balance;Decreased mobility;Decreased coordination;Decreased knowledge of use of DME;Decreased safety awareness;Cardiopulmonary status limiting activity;Obesity;Pain       PT Treatment Interventions DME instruction;Gait training;Functional mobility training;Therapeutic activities;Therapeutic exercise;Balance training;Neuromuscular re-education;Patient/family education    PT Goals (Current goals can be found in the Care Plan section)  Acute Rehab PT Goals Patient Stated Goal: to be able to walk and feel better PT Goal Formulation: With patient Time For Goal Achievement: 07/24/19 Potential to Achieve Goals: Good    Frequency Min 2X/week   Barriers to discharge Decreased caregiver support home with sporadic help and cannot stand    Co-evaluation               AM-PAC PT "6 Clicks" Mobility  Outcome Measure Help needed turning from your back to your side while in a flat bed without using bedrails?: A Lot Help needed moving from lying on your back to sitting on the side of a flat bed without using bedrails?: A Lot Help needed moving to and from a bed to a chair (including a wheelchair)?: A Lot Help needed standing up from a chair using your arms (e.g., wheelchair or bedside chair)?:  Total Help needed to walk in hospital room?: Total Help needed climbing 3-5 steps with a railing? : Total 6 Click Score: 9    End of Session Equipment Utilized During Treatment: Gait belt;Other (comment)(splint on RUE) Activity Tolerance: Patient limited by fatigue;Treatment limited secondary to medical complications (Comment)(WB limitations of RUE) Patient left: in bed;with call bell/phone within reach;with bed alarm set Nurse Communication: Mobility status;Other (comment)(made CNA aware pt is quite wet, needs a bath) PT Visit Diagnosis: Muscle weakness (generalized) (M62.81);History of falling (Z91.81);Difficulty in walking, not elsewhere classified (R26.2);Pain Pain - Right/Left: Right Pain - part of body: Hand    Time: 1740-8144 PT Time Calculation (min) (ACUTE ONLY): 36 min   Charges:   PT Evaluation $PT Eval Moderate Complexity: 1 Mod PT Treatments $Therapeutic Activity: 8-22 mins       Ramond Dial 07/10/2019, 11:03 AM   Mee Hives, PT MS Acute Rehab Dept. Number: [place] [facility] [place]

## 2019-07-10 NOTE — Progress Notes (Signed)
Patient returned from EGD no active bleeding ulcers and ok to restart diet.  Started patient on full liquid due to patient having lack of appetite and no dentures to help eat. Tolerated sips of diet soda and couple bites of jello.   Encouraged to elevate Right arm above heart while resting. No changes in right arm sensation fingers wiggle warm to touch, no numbness noted.

## 2019-07-10 NOTE — Op Note (Signed)
[facility] Patient Name: Beth Garrison Procedure Date : 07/10/2019 MRN: [id number] Attending MD: Carol Ada , MD Date of Birth: 05/29/1941 CSN: [id number] Age: 78 Admit Type: Inpatient Procedure:                Upper GI endoscopy Indications:              Iron deficiency anemia Providers:                Carol Ada, MD, Baird Cancer, RN, Ashley Jacobs,                            RN, Marguerita Merles, Technician Referring MD:              Medicines:                Midazolam 4 mg IV, Fentanyl 50 micrograms IV Complications:            No immediate complications. Estimated Blood Loss:     Estimated blood loss was minimal. Procedure:                Pre-Anesthesia Assessment:                           - Prior to the procedure, a History and Physical                            was performed, and patient medications and                            allergies were reviewed. The patient's tolerance of                            previous anesthesia was also reviewed. The risks                            and benefits of the procedure and the sedation                            options and risks were discussed with the patient.                            All questions were answered, and informed consent                            was obtained. Prior Anticoagulants: The patient has                            taken no previous anticoagulant or antiplatelet                            agents. ASA Grade Assessment: III - A patient with                            severe systemic disease. After reviewing the risks  and benefits, the patient was deemed in                            satisfactory condition to undergo the procedure.                           - Sedation was administered by an endoscopy nurse.                            The sedation level attained was moderate.                           After obtaining informed consent, the endoscope was              passed under direct vision. Throughout the                            procedure, the patient's blood pressure, pulse, and                            oxygen saturations were monitored continuously. The                            GIF-H190 ([id number]) Olympus gastroscope was                            introduced through the mouth, and advanced to the                            second part of duodenum. The upper GI endoscopy was                            accomplished without difficulty. The patient                            tolerated the procedure well. Scope In: Scope Out: Findings:      LA Grade D (one or more mucosal breaks involving at least 75% of       esophageal circumference) esophagitis with no bleeding was found in the       mid and lower esophagus.      Two non-bleeding cratered gastric ulcers with no stigmata of bleeding       were found in the gastric antrum. The largest lesion was 3 mm in largest       dimension. Biopsies were taken with a cold forceps for histology.      One non-bleeding superficial duodenal ulcer with no stigmata of bleeding       was found in the duodenal bulb. The lesion was 7 mm in largest dimension.      There was evidence of small superficial ulcerations in the antrum as       well as a larger superficial ulcer in the duodenal bulb. These ulcers       were clean-based. The current findings are consistent with NSAID-induced       ulcerations. Impression:               - LA  Grade D reflux esophagitis.                           - Non-bleeding gastric ulcers with no stigmata of                            bleeding. Biopsied.                           - Non-bleeding duodenal ulcer with no stigmata of                            bleeding. Recommendation:           - Return patient to hospital ward for ongoing care.                           - Resume regular diet.                           - Continue present medications.                           -  PPI QD.                           - Avoid NSAIDs.                           - Follow up with Dr. Collene Mares in two weeks. Procedure Code(s):        --- Professional ---                           [phone], Esophagogastroduodenoscopy, flexible,                            transoral; with biopsy, single or multiple Diagnosis Code(s):        --- Professional ---                           K21.0, Gastro-esophageal reflux disease with                            esophagitis                           K25.9, Gastric ulcer, unspecified as acute or                            chronic, without hemorrhage or perforation                           K26.9, Duodenal ulcer, unspecified as acute or                            chronic, without hemorrhage or perforation  D50.9, Iron deficiency anemia, unspecified CPT copyright 2019 [employer]. All rights reserved. The codes documented in this report are preliminary and upon coder review may  be revised to meet current compliance requirements. Carol Ada, MD Carol Ada, MD 07/10/2019 3:51:14 PM This report has been signed electronically. Number of Addenda: 0

## 2019-07-10 NOTE — Care Management Important Message (Signed)
Important Message  Patient Details  Name: Noam Franzen MRN: [id number] Date of Birth: 1941/10/19   Medicare Important Message Given:  Yes     Giomar Gusler 07/10/2019, 1:51 PM

## 2019-07-10 NOTE — Progress Notes (Signed)
PROGRESS NOTE    Beth Garrison  DDU:[id number]  DOB: [birth year]  DOA: 07/07/2019 PCP: System, Pcp Not In  Brief Narrative:   78 y.o. F with DM, HTN, and CKD III baseline Cr 1.2 who presented with weakness, SOB progressive over weeks.She also was noted to have brownish-black liquidy stool in the ED and lab work showed anemia.  GI consulted.work-up in the ED revealed AKI with creatinine 6.6, and hyperkalemia with K 7.5 She states she slipped and fell while walking out of the bathroom, 5 to 6 days prior to admission hurting her wrist.  She was taking acetaminophen and Advil for pain and had a soft brace.  Patient found to have wrist fracture right arm by x-ray.Patient now has right sugar tong splint placed in the ED per orthopedic recommendations.  Patient's home medications including lisinopril, HCTZ and metformin were held.  She was admitted to hospitalist service with GI/nephrology consultations.  Subjective:   Patient n.p.o. for scheduled EGD today.  She states she does not like hospital food and has not been eating much.  She denies any diarrhea but reports nausea.  Worked with PT today  Objective: Vitals:   07/10/19 1525 07/10/19 1530 07/10/19 1535 07/10/19 1540  BP: 106/67 113/73 114/61 (!) 142/61  Pulse: 66 77 70   Resp: 13 18    Temp:  98.1 F (36.7 C)    TempSrc:  Oral    SpO2: 99% 97% 94%   Weight:      Height:        Intake/Output Summary (Last 24 hours) at 07/10/2019 1620 Last data filed at 07/10/2019 0800 Gross per 24 hour  Intake 1075.06 ml  Output 600 ml  Net 475.06 ml   Filed Weights   07/08/19 0500 07/09/19 0319 07/10/19 1350  Weight: 77.8 kg 79.4 kg 80 kg    Physical Examination:  General exam: Appears calm and comfortable  Respiratory system: Clear to auscultation. Respiratory effort normal. Cardiovascular system: S1 & S2 heard,?  Mild systolic murmur. No pedal edema. Gastrointestinal system: Abdomen is nondistended, soft and nontender. No organomegaly or  masses felt. Normal bowel sounds heard. Central nervous system: Alert and oriented. No focal neurological deficits. Extremities: Splint along right wrist/forearm Skin: No rashes, lesions or ulcers Psychiatry: Judgement and insight appear normal. Mood & affect appropriate.     Data Reviewed: I have personally reviewed following labs and imaging studies  CBC: Recent Labs  Lab 07/07/19 1425 07/09/19 0318 07/10/19 0239  WBC 9.5 8.7 9.0  NEUTROABS 7.8* 6.2 6.3  HGB 7.8* 7.9* 7.4*  HCT 26.0* 23.4* 22.9*  MCV 97.0 87.0 90.5  PLT 235 186 237   Basic Metabolic Panel: Recent Labs  Lab 07/07/19 1425 07/07/19 1739 07/07/19 2018 07/08/19 0610 07/09/19 0318 07/10/19 0239  NA 139  --  143 143 142 140  K >7.5* 6.4* 6.4* 5.0 3.4* 3.2*  CL 116*  --  120* 116* 103 105  CO2 9*  --  9* 13* 25 25  GLUCOSE 154*  --  118* 161* 226* 143*  BUN 116*  --  100* 93* 63* 45*  CREATININE 6.67*  --  6.07* 5.28* 3.23* 2.50*  CALCIUM 8.9  --  9.0 8.6* 7.7* 7.2*  PHOS  --   --  6.7* 6.1* 3.3 3.8   GFR: Estimated Creatinine Clearance: 20 mL/min (A) (by C-G formula based on SCr of 2.5 mg/dL (H)). Liver Function Tests: Recent Labs  Lab 07/07/19 1425 07/07/19 2018 07/08/19 0610 07/09/19 0318 07/10/19  0239  AST 45*  --  37  --   --   ALT 21  --  20  --   --   ALKPHOS 120  --  97  --   --   BILITOT 0.5  --  0.6  --   --   PROT 7.0  --  5.4*  --   --   ALBUMIN 3.5 3.5 2.7*  2.8* 2.4* 2.1*   No results for input(s): LIPASE, AMYLASE in the last 168 hours. No results for input(s): AMMONIA in the last 168 hours. Coagulation Profile: Recent Labs  Lab 07/07/19 1425  INR 1.4*   Cardiac Enzymes: Recent Labs  Lab 07/07/19 2018 07/09/19 0318 07/10/19 0239  CKTOTAL 2,774* 873* 428*   BNP (last 3 results) No results for input(s): PROBNP in the last 8760 hours. HbA1C: Recent Labs    07/07/19 2018  HGBA1C 5.5   CBG: Recent Labs  Lab 07/09/19 1108 07/09/19 1602 07/09/19 2113 07/10/19  0755 07/10/19 1139  GLUCAP 151* 191* 155* 119* 126*   Lipid Profile: No results for input(s): CHOL, HDL, LDLCALC, TRIG, CHOLHDL, LDLDIRECT in the last 72 hours. Thyroid Function Tests: No results for input(s): TSH, T4TOTAL, FREET4, T3FREE, THYROIDAB in the last 72 hours. Anemia Panel: Recent Labs    07/09/19 0318  FERRITIN 166  TIBC 172*  IRON 25*   Sepsis Labs: Recent Labs  Lab 07/07/19 1425  LATICACIDVEN 0.7    Recent Results (from the past 240 hour(s))  Culture, blood (Routine x 2)     Status: None (Preliminary result)   Collection Time: 07/07/19  2:25 PM   Specimen: BLOOD  Result Value Ref Range Status   Specimen Description   Final    BLOOD LEFT ANTECUBITAL Performed at [facility] [address], [place], [place] [address]    Special Requests   Final    BOTTLES DRAWN AEROBIC AND ANAEROBIC Blood Culture results may not be optimal due to an excessive volume of blood received in culture bottles Performed at [facility] [address], [place], [place] [address]    Culture   Final    NO GROWTH 3 DAYS Performed at [facility], [place] [address] [place], [place] [address]    Report Status PENDING  Incomplete  Culture, blood (Routine x 2)     Status: Abnormal   Collection Time: 07/07/19  2:25 PM   Specimen: BLOOD LEFT HAND  Result Value Ref Range Status   Specimen Description   Final    BLOOD LEFT HAND Performed at [facility] [address], [place], [place] [address]    Special Requests   Final    BOTTLES DRAWN AEROBIC AND ANAEROBIC Blood Culture results may not be optimal due to an excessive volume of blood received in culture bottles Performed at [facility] [address], [place], [place] [address]    Culture  Setup Time   Final    GRAM POSITIVE COCCI AEROBIC BOTTLE ONLY CRITICAL RESULT CALLED TO, READ BACK BY AND VERIFIED WITH: Dawson Bills PharmD 15:40 07/08/19  ([email or web address])    Culture (A)  Final    STAPHYLOCOCCUS SPECIES (COAGULASE NEGATIVE) THE SIGNIFICANCE OF ISOLATING THIS ORGANISM FROM A SINGLE SET OF BLOOD CULTURES WHEN MULTIPLE SETS ARE DRAWN IS UNCERTAIN. PLEASE NOTIFY THE MICROBIOLOGY DEPARTMENT WITHIN ONE WEEK IF SPECIATION AND SENSITIVITIES ARE REQUIRED. Performed at [facility], [place] [address] [place], [place] [address]  Report Status 07/10/2019 FINAL  Final  Blood Culture ID Panel (Reflexed)     Status: Abnormal   Collection Time: 07/07/19  2:25 PM  Result Value Ref Range Status   Enterococcus species NOT DETECTED NOT DETECTED Final   Listeria monocytogenes NOT DETECTED NOT DETECTED Final   Staphylococcus species DETECTED (A) NOT DETECTED Final    Comment: Methicillin (oxacillin) susceptible coagulase negative staphylococcus. Possible blood culture contaminant (unless isolated from more than one blood culture draw or clinical case suggests pathogenicity). No antibiotic treatment is indicated for blood  culture contaminants. CRITICAL RESULT CALLED TO, READ BACK BY AND VERIFIED WITH: Dawson Bills PharmD 15:40 07/08/19 ([email or web address])    Staphylococcus aureus (BCID) NOT DETECTED NOT DETECTED Final   Methicillin resistance NOT DETECTED NOT DETECTED Final   Streptococcus species NOT DETECTED NOT DETECTED Final   Streptococcus agalactiae NOT DETECTED NOT DETECTED Final   Streptococcus pneumoniae NOT DETECTED NOT DETECTED Final   Streptococcus pyogenes NOT DETECTED NOT DETECTED Final   Acinetobacter baumannii NOT DETECTED NOT DETECTED Final   Enterobacteriaceae species NOT DETECTED NOT DETECTED Final   Enterobacter cloacae complex NOT DETECTED NOT DETECTED Final   Escherichia coli NOT DETECTED NOT DETECTED Final   Klebsiella oxytoca NOT DETECTED NOT DETECTED Final   Klebsiella pneumoniae NOT DETECTED NOT DETECTED Final   Proteus species NOT DETECTED NOT DETECTED Final   Serratia marcescens NOT DETECTED NOT DETECTED Final   Haemophilus  influenzae NOT DETECTED NOT DETECTED Final   Neisseria meningitidis NOT DETECTED NOT DETECTED Final   Pseudomonas aeruginosa NOT DETECTED NOT DETECTED Final   Candida albicans NOT DETECTED NOT DETECTED Final   Candida glabrata NOT DETECTED NOT DETECTED Final   Candida krusei NOT DETECTED NOT DETECTED Final   Candida parapsilosis NOT DETECTED NOT DETECTED Final   Candida tropicalis NOT DETECTED NOT DETECTED Final    Comment: Performed at [facility], [address], [place], [place] [address]  Urine culture     Status: Abnormal   Collection Time: 07/07/19  2:31 PM   Specimen: Urine, Random  Result Value Ref Range Status   Specimen Description   Final    URINE, RANDOM Performed at [facility] [address], [place], [place] [address]    Special Requests   Final    NONE Performed at [facility], [place] [address] [place], [place] [address]    Culture (A)  Final    >=100,000 COLONIES/mL ESCHERICHIA COLI 70,000 COLONIES/mL KLEBSIELLA PNEUMONIAE    Report Status 07/10/2019 FINAL  Final   Organism ID, Bacteria ESCHERICHIA COLI (A)  Final   Organism ID, Bacteria KLEBSIELLA PNEUMONIAE (A)  Final      Susceptibility   Escherichia coli - MIC*    AMPICILLIN <=2 SENSITIVE Sensitive     CEFAZOLIN <=4 SENSITIVE Sensitive     CEFTRIAXONE <=1 SENSITIVE Sensitive     CIPROFLOXACIN <=0.25 SENSITIVE Sensitive     GENTAMICIN <=1 SENSITIVE Sensitive     IMIPENEM <=0.25 SENSITIVE Sensitive     NITROFURANTOIN <=16 SENSITIVE Sensitive     TRIMETH/SULFA <=20 SENSITIVE Sensitive     AMPICILLIN/SULBACTAM <=2 SENSITIVE Sensitive     PIP/TAZO <=4 SENSITIVE Sensitive     Extended ESBL NEGATIVE Sensitive     * >=100,000 COLONIES/mL ESCHERICHIA COLI   Klebsiella pneumoniae - MIC*    AMPICILLIN RESISTANT Resistant     CEFAZOLIN <=4 SENSITIVE Sensitive     CEFTRIAXONE <=1 SENSITIVE Sensitive     CIPROFLOXACIN <=0.25 SENSITIVE  Sensitive     GENTAMICIN <=1  SENSITIVE Sensitive     IMIPENEM <=0.25 SENSITIVE Sensitive     NITROFURANTOIN <=16 SENSITIVE Sensitive     TRIMETH/SULFA <=20 SENSITIVE Sensitive     AMPICILLIN/SULBACTAM <=2 SENSITIVE Sensitive     PIP/TAZO <=4 SENSITIVE Sensitive     Extended ESBL NEGATIVE Sensitive     * 70,000 COLONIES/mL KLEBSIELLA PNEUMONIAE  SARS Coronavirus 2 [facility] order, Performed in [facility] hospital lab) Nasopharyngeal Nasopharyngeal Swab     Status: None   Collection Time: 07/07/19  2:31 PM   Specimen: Nasopharyngeal Swab  Result Value Ref Range Status   SARS Coronavirus 2 NEGATIVE NEGATIVE Final    Comment: (NOTE) If result is NEGATIVE SARS-CoV-2 target nucleic acids are NOT DETECTED. The SARS-CoV-2 RNA is generally detectable in upper and lower  respiratory specimens during the acute phase of infection. The lowest  concentration of SARS-CoV-2 viral copies this assay can detect is 250  copies / mL. A negative result does not preclude SARS-CoV-2 infection  and should not be used as the sole basis for treatment or other  patient management decisions.  A negative result may occur with  improper specimen collection / handling, submission of specimen other  than nasopharyngeal swab, presence of viral mutation(s) within the  areas targeted by this assay, and inadequate number of viral copies  (<250 copies / mL). A negative result must be combined with clinical  observations, patient history, and epidemiological information. If result is POSITIVE SARS-CoV-2 target nucleic acids are DETECTED. The SARS-CoV-2 RNA is generally detectable in upper and lower  respiratory specimens dur ing the acute phase of infection.  Positive  results are indicative of active infection with SARS-CoV-2.  Clinical  correlation with patient history and other diagnostic information is  necessary to determine patient infection status.  Positive results do  not rule out bacterial infection or co-infection with other viruses.  If result is PRESUMPTIVE POSTIVE SARS-CoV-2 nucleic acids MAY BE PRESENT.   A presumptive positive result was obtained on the submitted specimen  and confirmed on repeat testing.  While 2019 novel coronavirus  (SARS-CoV-2) nucleic acids may be present in the submitted sample  additional confirmatory testing may be necessary for epidemiological  and / or clinical management purposes  to differentiate between  SARS-CoV-2 and other Sarbecovirus currently known to infect humans.  If clinically indicated additional testing with an alternate test  methodology [phone]) is advised. The SARS-CoV-2 RNA is generally  detectable in upper and lower respiratory sp ecimens during the acute  phase of infection. The expected result is Negative. Fact Sheet for Patients:  StrictlyIdeas.no Fact Sheet for Healthcare Providers: BankingDealers.co.za This test is not yet approved or cleared by the [place] FDA and has been authorized for detection and/or diagnosis of SARS-CoV-2 by FDA under an Emergency Use Authorization (EUA).  This EUA will remain in effect (meaning this test can be used) for the duration of the COVID-19 declaration under Section 564(b)(1) of the Act, 21 U.S.C. section 360bbb-3(b)(1), unless the authorization is terminated or revoked sooner. Performed at [facility], [place] [address] [place], [place] [address]   MRSA PCR Screening     Status: None   Collection Time: 07/08/19  5:40 AM   Specimen: Nasal Mucosa; Nasopharyngeal  Result Value Ref Range Status   MRSA by PCR NEGATIVE NEGATIVE Final    Comment:        The GeneXpert MRSA Assay (FDA approved for NASAL specimens only), is  one component of a comprehensive MRSA colonization surveillance program. It is not intended to diagnose MRSA infection nor to guide or monitor treatment for MRSA infections. Performed at [facility], [place] [address],  [place], [place] [address]   Culture, blood (Routine X 2) w Reflex to ID Panel     Status: None (Preliminary result)   Collection Time: 07/09/19  7:55 AM   Specimen: BLOOD LEFT HAND  Result Value Ref Range Status   Specimen Description BLOOD LEFT HAND  Final   Special Requests   Final    BOTTLES DRAWN AEROBIC ONLY Blood Culture adequate volume   Culture   Final    NO GROWTH 1 DAY Performed at [facility], [place] [address], [place], [place] [address]    Report Status PENDING  Incomplete  Culture, blood (Routine X 2) w Reflex to ID Panel     Status: None (Preliminary result)   Collection Time: 07/09/19  7:56 AM   Specimen: BLOOD LEFT HAND  Result Value Ref Range Status   Specimen Description BLOOD LEFT HAND  Final   Special Requests   Final    BOTTLES DRAWN AEROBIC ONLY Blood Culture results may not be optimal due to an inadequate volume of blood received in culture bottles   Culture   Final    NO GROWTH 1 DAY Performed at [facility], [place] [address] [place], [place] [address]    Report Status PENDING  Incomplete      Radiology Studies: No results found.      Scheduled Meds: . cephALEXin  500 mg Oral Q12H  . insulin aspart  0-9 Units Subcutaneous TID WC  . pantoprazole (PROTONIX) IV  40 mg Intravenous Q12H  . sodium chloride flush  3 mL Intravenous Once   Continuous Infusions: . sodium chloride 75 mL/hr at 07/09/19 2215  . [START ON 07/12/2019] ferumoxytol      Assessment & Plan:    1.  Acute renal failure with hyperkalemia/metabolic acidosis: Present on admission.  Likely secondary to ATN or prerenal in the setting of ACE inhibitor/diuretics/NSAID use.  Improving after holding medications and with IV hydration.  Continue to hold ACE inhibitor/diuretic/metformin.  Appreciate nephrology evaluation and follow-up.  They have now signed off.  2.  Elevated CK: Secondary to recent fall/injury versus statin induced.  Crestor on hold.  CK peaked to 2774 and now down to  428.  3.  Anemia: Last hemoglobin from 2016 at 12.  Has been stable around 7.2 while here.  Status post 1 unit PRBC in the ED.  No further black stools noted.  Seen by GI and plan for EGD today.  Advance diet as tolerated post EGD.  Her oral intake remains poor.  PPI  4.  Hypertension: Home medications on hold due to problem #1.  Normotensive here in spite of IV hydration.  5.  Diabetes mellitus: Sliding scale insulin.  Metformin held due to problem #1 and metabolic acidosis.  Hemoglobin A1c at 5.5.  Can likely control with diet  6.  UTI: Urine culture grew E. coli.  Ceftriaxone changed to cephalexin to complete on 8/8   DVT prophylaxis: SCDs given problem #3 Code Status: Full code Family / Patient Communication: Discussed with patient and all questions answered. Disposition Plan: Seen by PT and recommended skilled nursing facility given two-person assist with care of right upper extremity..  Case management to follow-up     LOS: 3 days    Time spent:  35 minutes    Guilford Shi, MD [facility] Pager [phone]  If 7PM-7AM, please contact night-coverage [email or web address] Password [email or web address] 07/10/2019, 4:20 PM

## 2019-07-10 NOTE — Progress Notes (Signed)
Subjective:  1000 UOP recorded, crt improved nicely -   Objective Vital signs in last 24 hours: Vitals:   07/09/19 1135 07/09/19 1538 07/09/19 2109 07/10/19 0638  BP:  (!) 121/41 (!) 125/47 (!) 122/54  Pulse: 87 96 86 79  Resp: 16 (!) 21 16 16   Temp:  98.1 F (36.7 C) 98.3 F (36.8 C) 98.1 F (36.7 C)  TempSrc:  Oral Oral Oral  SpO2: 95% 95% 96% 95%  Weight:      Height:       Weight change:   Intake/Output Summary (Last 24 hours) at 07/10/2019 1018 Last data filed at 07/10/2019 0800 Gross per 24 hour  Intake 2097.68 ml  Output 1100 ml  Net 997.68 ml    Assessment/ Plan: Pt is a 78 y.o. yo female with DM, CKD 3 and HTN on ACE and metformin, ? NSAIDS who was admitted on 07/07/2019 with confusion, AKI with hyperkalemia   Assessment/Plan: 1. Renal- AKI in the setting of ACE and possibly NSAIDS- crt 1.16 on 01/09/18, 1.31 on 06/04/18.   Over 6 this admit.  Presumably due to hemodynamics with meds complicating- crt has trended down nicely with stopping ACE and metformin.   Other labs to rule out etiology are negative, CK high at close to 3000- has improved.  Renal function is improving nicely so seems that this is all hemodynamic and meds 2. Metabolic acidosis- due to renal failure and also metformin-  Metformin held -  corrected, off bicarb drip   3. Hyperkalemia- trended down with medical management- now hypo 4. Anemia- new, iron stores low, will replete- schedule next and final dose of feraheme tomorrow  5. HTN/volume- still seems a little dry to me-on NS- encourage eating and drinking    Anticipate continued renal recover, will sign off-  Would not resume ACE unless crt back to her baseline and BP warrants it-  Likely as OP.  Does not need renal specific follow up if crt gets down to baseline  [place]: Basic Metabolic Panel: Recent Labs  Lab 07/08/19 0610 07/09/19 0318 07/10/19 0239  NA 143 142 140  K 5.0 3.4* 3.2*  CL 116* 103 105  CO2 13* 25 25   GLUCOSE 161* 226* 143*  BUN 93* 63* 45*  CREATININE 5.28* 3.23* 2.50*  CALCIUM 8.6* 7.7* 7.2*  PHOS 6.1* 3.3 3.8   Liver Function Tests: Recent Labs  Lab 07/07/19 1425  07/08/19 0610 07/09/19 0318 07/10/19 0239  AST 45*  --  37  --   --   ALT 21  --  20  --   --   ALKPHOS 120  --  97  --   --   BILITOT 0.5  --  0.6  --   --   PROT 7.0  --  5.4*  --   --   ALBUMIN 3.5   < > 2.7*  2.8* 2.4* 2.1*   < > = values in this interval not displayed.   No results for input(s): LIPASE, AMYLASE in the last 168 hours. No results for input(s): AMMONIA in the last 168 hours. CBC: Recent Labs  Lab 07/07/19 1425 07/09/19 0318 07/10/19 0239  WBC 9.5 8.7 9.0  NEUTROABS 7.8* 6.2 6.3  HGB 7.8* 7.9* 7.4*  HCT 26.0* 23.4* 22.9*  MCV 97.0 87.0 90.5  PLT 235 186 167   Cardiac Enzymes: Recent Labs  Lab 07/07/19 2018 07/09/19 0318 07/10/19 0239  CKTOTAL 2,774* 873* 428*  CBG: Recent Labs  Lab 07/09/19 0615 07/09/19 1108 07/09/19 1602 07/09/19 2113 07/10/19 0755  GLUCAP 184* 151* 191* 155* 119*    Iron Studies:  Recent Labs    07/09/19 0318  IRON 25*  TIBC 172*  FERRITIN 166   Studies/Results: No results found. Medications: Infusions: . sodium chloride 75 mL/hr at 07/09/19 2215    Scheduled Medications: . cephALEXin  500 mg Oral Q8H  . insulin aspart  0-9 Units Subcutaneous TID WC  . pantoprazole (PROTONIX) IV  40 mg Intravenous Q12H  . sodium chloride flush  3 mL Intravenous Once    have reviewed scheduled and prn medications.  Physical Exam: General: more alert than previously noted.   Heart: RRR Lungs: mostly clear Abdomen: soft, non tender Extremities: no edema     07/10/2019,10:18 AM  LOS: 3 days

## 2019-07-11 ENCOUNTER — Encounter (HOSPITAL_COMMUNITY): Payer: Self-pay | Admitting: Gastroenterology

## 2019-07-11 LAB — RENAL FUNCTION PANEL
Albumin: 2 g/dL — ABNORMAL LOW (ref 3.5–5.0)
Anion gap: 11 (ref 5–15)
BUN: 34 mg/dL — ABNORMAL HIGH (ref 8–23)
CO2: 22 mmol/L (ref 22–32)
Calcium: 7.5 mg/dL — ABNORMAL LOW (ref 8.9–10.3)
Chloride: 106 mmol/L (ref 98–111)
Creatinine, Ser: 2.1 mg/dL — ABNORMAL HIGH (ref 0.44–1.00)
GFR calc Af Amer: 26 mL/min — ABNORMAL LOW (ref >60)
GFR calc non Af Amer: 22 mL/min — ABNORMAL LOW (ref >60)
Glucose, Bld: 119 mg/dL — ABNORMAL HIGH (ref 70–99)
Phosphorus: 3.4 mg/dL (ref 2.5–4.6)
Potassium: 3.1 mmol/L — ABNORMAL LOW (ref 3.5–5.1)
Sodium: 139 mmol/L (ref 135–145)

## 2019-07-11 LAB — CBC WITH DIFFERENTIAL/PLATELET
Abs Immature Granulocytes: 0.38 10*3/uL — ABNORMAL HIGH (ref 0.00–0.07)
Basophils Absolute: 0 10*3/uL (ref 0.0–0.1)
Basophils Relative: 0 %
Eosinophils Absolute: 0.2 10*3/uL (ref 0.0–0.5)
Eosinophils Relative: 2 %
HCT: 23.1 % — ABNORMAL LOW (ref 36.0–46.0)
Hemoglobin: 7.5 g/dL — ABNORMAL LOW (ref 12.0–15.0)
Immature Granulocytes: 5 %
Lymphocytes Relative: 20 %
Lymphs Abs: 1.6 10*3/uL (ref 0.7–4.0)
MCH: 29.8 pg (ref 26.0–34.0)
MCHC: 32.5 g/dL (ref 30.0–36.0)
MCV: 91.7 fL (ref 80.0–100.0)
Monocytes Absolute: 0.6 10*3/uL (ref 0.1–1.0)
Monocytes Relative: 8 %
Neutro Abs: 4.9 10*3/uL (ref 1.7–7.7)
Neutrophils Relative %: 65 %
Platelets: 161 10*3/uL (ref 150–400)
RBC: 2.52 MIL/uL — ABNORMAL LOW (ref 3.87–5.11)
RDW: 13.6 % (ref 11.5–15.5)
WBC: 7.7 10*3/uL (ref 4.0–10.5)
nRBC: 0 % (ref 0.0–0.2)

## 2019-07-11 LAB — GLUCOSE, CAPILLARY
Glucose-Capillary: 116 mg/dL — ABNORMAL HIGH (ref 70–99)
Glucose-Capillary: 109 mg/dL — ABNORMAL HIGH (ref 70–99)
Glucose-Capillary: 148 mg/dL — ABNORMAL HIGH (ref 70–99)
Glucose-Capillary: 88 mg/dL (ref 70–99)

## 2019-07-11 LAB — COMPLEMENT, TOTAL: Compl, Total (CH50): >60 U/mL (ref >41)

## 2019-07-11 LAB — CK: Total CK: 202 U/L (ref 38–234)

## 2019-07-11 MED ORDER — POTASSIUM CHLORIDE 20 MEQ PO PACK
40.0000 meq | PACK | Freq: Once | ORAL | Status: AC
  Administered 2019-07-11: 40 meq via ORAL
  Filled 2019-07-11: qty 2

## 2019-07-11 NOTE — Progress Notes (Addendum)
Subjective: Patient doing well today. Denies GI sx including chest pain, burning in her chest, abdominal pain, nausea, vomiting and diarrhea. She has had some trouble eating secondary to not having her dentures but will get a soft diet today. She has no other complaints or concerns.  Objective: Vital signs in last 24 hours: Temp:  [97.5 F (36.4 C)-98.2 F (36.8 C)] 98.2 F (36.8 C) (08/07 0525) Pulse Rate:  [66-88] 72 (08/07 0525) Resp:  [13-20] 16 (08/07 0525) BP: (106-156)/(37-73) 130/43 (08/07 0525) SpO2:  [94 %-99 %] 96 % (08/07 0525) Weight:  [80 kg] 80 kg (08/06 1350) Last BM Date: 07/09/19  Intake/Output from previous day: 08/06 0701 - 08/07 0700 In: 698.7 [P.O.:180; I.V.:518.7] Out: 1250 [Urine:1250] Intake/Output this shift: Total I/O In: 120 [P.O.:120] Out: -   Physical Exam  Constitutional: She is oriented to person, place, and time. No distress.  Cardiovascular: Normal rate, regular rhythm, normal heart sounds and intact distal pulses.  No murmur heard. Pulmonary/Chest: Effort normal and breath sounds normal. No respiratory distress. She exhibits no tenderness.  Abdominal: Soft. Bowel sounds are normal. She exhibits no distension. There is no abdominal tenderness.  Musculoskeletal: Normal range of motion.  Neurological: She is alert and oriented to person, place, and time.  Skin: Skin is warm and dry. She is not diaphoretic. No erythema.  Psychiatric: Affect normal.  Nursing note and vitals reviewed.   Lab Results: Recent Labs    07/09/19 0318 07/10/19 0239 07/11/19 0454  WBC 8.7 9.0 7.7  HGB 7.9* 7.4* 7.5*  HCT 23.4* 22.9* 23.1*  PLT 186 167 161   BMET Recent Labs    07/09/19 0318 07/10/19 0239 07/11/19 0454  NA 142 140 139  K 3.4* 3.2* 3.1*  CL 103 105 106  CO2 25 25 22   GLUCOSE 226* 143* 119*  BUN 63* 45* 34*  CREATININE 3.23* 2.50* 2.10*  CALCIUM 7.7* 7.2* 7.5*   LFT Recent Labs    07/11/19 0454  ALBUMIN 2.0*   PT/INR No results  for input(s): LABPROT, INR in the last 72 hours. Studies/Results: No results found. Medications: I have reviewed the patient's current medications.  Assessment/Plan: Patient is a 78 year old white female with a history of diabetes, hypertension, chronic kidney disease stage III with baseline creatinine of 1.2 who was admitted to the hospital with weakness,shortness of breath and dark stools and was found to have a creatinine of 6.7, anemia and guaiac positive stools in setting of frequent NSAID use for arthritis.  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 Patient without bleeding or GI sx today and understands the need to avoid all NSAIDs. Creatinine continues to downtrend. Patient alert and oriented.  1) continue protonix and avoid NSAIDs 2) ok to d/c to snf per primary team 3) fu with Dr. Collene Mares in 2 weeks   ADDENDUM:   The patient is weak, but feeling better.  I concur with the resident's note.  She is to avoid NSAIDs and to continue with pantoprazole or any PPI indefinitely.   LOS: 4 days   Al Decant 07/11/2019, 10:43 AM

## 2019-07-11 NOTE — Progress Notes (Signed)
PROGRESS NOTE    Beth Garrison  MVE:[id number]  DOB: October 13, 1941  DOA: 07/07/2019 PCP: System, Pcp Not In  Brief Narrative:   78 y.o. F with DM, HTN, and CKD III baseline Cr 1.2 who presented with weakness, SOB progressive over weeks.She also was noted to have brownish-black liquidy stool in the ED and lab work showed anemia.  GI consulted.work-up in the ED revealed AKI with creatinine 6.6, and hyperkalemia with K 7.5 She states she slipped and fell while walking out of the bathroom, 5 to 6 days prior to admission hurting her wrist.  She was taking acetaminophen and Advil for pain and had a soft brace.  Patient found to have wrist fracture right arm by x-ray.Patient now has right sugar tong splint placed in the ED per orthopedic recommendations.  Patient's home medications including lisinopril, HCTZ and metformin were held.  She was admitted to hospitalist service with GI/nephrology consultations.  Subjective:   Patient resting comfortably. GI team bedside. Doesn't have dentures but feels ready for soft diet.    Objective: Vitals:   07/10/19 1628 07/10/19 2116 07/11/19 0525 07/11/19 1301  BP: (!) 132/48 (!) 129/48 (!) 130/43 (!) 127/50  Pulse: 68 88 72 69  Resp: 20 18 16 18   Temp: 97.6 F (36.4 C) (!) 97.5 F (36.4 C) 98.2 F (36.8 C) 98.3 F (36.8 C)  TempSrc: Oral Oral  Oral  SpO2: 96% 97% 96% 96%  Weight:      Height:        Intake/Output Summary (Last 24 hours) at 07/11/2019 1828 Last data filed at 07/11/2019 1329 Gross per 24 hour  Intake 220 ml  Output 1325 ml  Net -1105 ml   Filed Weights   07/08/19 0500 07/09/19 0319 07/10/19 1350  Weight: 77.8 kg 79.4 kg 80 kg    Physical Examination:  General exam: Appears calm and comfortable  Respiratory system: Clear to auscultation. Respiratory effort normal. Cardiovascular system: S1 & S2 heard,?  Mild systolic murmur. No pedal edema. Gastrointestinal system: Abdomen is nondistended, soft and nontender. No organomegaly or  masses felt. Normal bowel sounds heard. Central nervous system: Alert and oriented. No focal neurological deficits. Extremities: Splint along right wrist/forearm Skin: No rashes, lesions or ulcers Psychiatry: Judgement and insight appear normal. Mood & affect appropriate.     Data Reviewed: I have personally reviewed following labs and imaging studies  CBC: Recent Labs  Lab 07/07/19 1425 07/09/19 0318 07/10/19 0239 07/11/19 0454  WBC 9.5 8.7 9.0 7.7  NEUTROABS 7.8* 6.2 6.3 4.9  HGB 7.8* 7.9* 7.4* 7.5*  HCT 26.0* 23.4* 22.9* 23.1*  MCV 97.0 87.0 90.5 91.7  PLT 235 186 167 283   Basic Metabolic Panel: Recent Labs  Lab 07/07/19 2018 07/08/19 0610 07/09/19 0318 07/10/19 0239 07/11/19 0454  NA 143 143 142 140 139  K 6.4* 5.0 3.4* 3.2* 3.1*  CL 120* 116* 103 105 106  CO2 9* 13* 25 25 22   GLUCOSE 118* 161* 226* 143* 119*  BUN 100* 93* 63* 45* 34*  CREATININE 6.07* 5.28* 3.23* 2.50* 2.10*  CALCIUM 9.0 8.6* 7.7* 7.2* 7.5*  PHOS 6.7* 6.1* 3.3 3.8 3.4   GFR: Estimated Creatinine Clearance: 23.8 mL/min (A) (by C-G formula based on SCr of 2.1 mg/dL (H)). Liver Function Tests: Recent Labs  Lab 07/07/19 1425 07/07/19 2018 07/08/19 0610 07/09/19 0318 07/10/19 0239 07/11/19 0454  AST 45*  --  37  --   --   --   ALT 21  --  20  --   --   --   ALKPHOS 120  --  97  --   --   --   BILITOT 0.5  --  0.6  --   --   --   PROT 7.0  --  5.4*  --   --   --   ALBUMIN 3.5 3.5 2.7*  2.8* 2.4* 2.1* 2.0*   No results for input(s): LIPASE, AMYLASE in the last 168 hours. No results for input(s): AMMONIA in the last 168 hours. Coagulation Profile: Recent Labs  Lab 07/07/19 1425  INR 1.4*   Cardiac Enzymes: Recent Labs  Lab 07/07/19 2018 07/09/19 0318 07/10/19 0239 07/11/19 0454  CKTOTAL 2,774* 873* 428* 202   BNP (last 3 results) No results for input(s): PROBNP in the last 8760 hours. HbA1C: No results for input(s): HGBA1C in the last 72 hours. CBG: Recent Labs  Lab  07/10/19 2116 07/11/19 0651 07/11/19 0802 07/11/19 1258 07/11/19 1732  GLUCAP 183* 116* 109* 148* 88   Lipid Profile: No results for input(s): CHOL, HDL, LDLCALC, TRIG, CHOLHDL, LDLDIRECT in the last 72 hours. Thyroid Function Tests: No results for input(s): TSH, T4TOTAL, FREET4, T3FREE, THYROIDAB in the last 72 hours. Anemia Panel: Recent Labs    07/09/19 0318  FERRITIN 166  TIBC 172*  IRON 25*   Sepsis Labs: Recent Labs  Lab 07/07/19 1425  LATICACIDVEN 0.7    Recent Results (from the past 240 hour(s))  Culture, blood (Routine x 2)     Status: None (Preliminary result)   Collection Time: 07/07/19  2:25 PM   Specimen: BLOOD  Result Value Ref Range Status   Specimen Description   Final    BLOOD LEFT ANTECUBITAL Performed at [facility] [address] [place], [place] [address]    Special Requests   Final    BOTTLES DRAWN AEROBIC AND ANAEROBIC Blood Culture results may not be optimal due to an excessive volume of blood received in culture bottles Performed at [facility] [address], [place], [place] [address]    Culture   Final    NO GROWTH 4 DAYS Performed at [facility], [place] [address] [place], [place] [address]    Report Status PENDING  Incomplete  Culture, blood (Routine x 2)     Status: Abnormal   Collection Time: 07/07/19  2:25 PM   Specimen: BLOOD LEFT HAND  Result Value Ref Range Status   Specimen Description   Final    BLOOD LEFT HAND Performed at [facility] [address], [place], [place] [address]    Special Requests   Final    BOTTLES DRAWN AEROBIC AND ANAEROBIC Blood Culture results may not be optimal due to an excessive volume of blood received in culture bottles Performed at [facility] [address], [place], [place] [address]    Culture  Setup Time   Final    GRAM POSITIVE COCCI AEROBIC BOTTLE ONLY CRITICAL RESULT CALLED TO, READ BACK BY AND  VERIFIED WITH: Dawson Bills PharmD 15:40 07/08/19 ([email or web address])    Culture (A)  Final    STAPHYLOCOCCUS SPECIES (COAGULASE NEGATIVE) THE SIGNIFICANCE OF ISOLATING THIS ORGANISM FROM A SINGLE SET OF BLOOD CULTURES WHEN MULTIPLE SETS ARE DRAWN IS UNCERTAIN. PLEASE NOTIFY THE MICROBIOLOGY DEPARTMENT WITHIN ONE WEEK IF SPECIATION AND SENSITIVITIES ARE REQUIRED. Performed at [facility], [place] [address] [place], [place] [address]    Report Status 07/10/2019 FINAL  Final  Blood Culture ID Panel (Reflexed)     Status: Abnormal   Collection Time: 07/07/19  2:25 PM  Result Value Ref Range Status   Enterococcus species NOT DETECTED NOT DETECTED Final   Listeria monocytogenes NOT DETECTED NOT DETECTED Final   Staphylococcus species DETECTED (A) NOT DETECTED Final    Comment: Methicillin (oxacillin) susceptible coagulase negative staphylococcus. Possible blood culture contaminant (unless isolated from more than one blood culture draw or clinical case suggests pathogenicity). No antibiotic treatment is indicated for blood  culture contaminants. CRITICAL RESULT CALLED TO, READ BACK BY AND VERIFIED WITH: Dawson Bills PharmD 15:40 07/08/19 ([email or web address])    Staphylococcus aureus (BCID) NOT DETECTED NOT DETECTED Final   Methicillin resistance NOT DETECTED NOT DETECTED Final   Streptococcus species NOT DETECTED NOT DETECTED Final   Streptococcus agalactiae NOT DETECTED NOT DETECTED Final   Streptococcus pneumoniae NOT DETECTED NOT DETECTED Final   Streptococcus pyogenes NOT DETECTED NOT DETECTED Final   Acinetobacter baumannii NOT DETECTED NOT DETECTED Final   Enterobacteriaceae species NOT DETECTED NOT DETECTED Final   Enterobacter cloacae complex NOT DETECTED NOT DETECTED Final   Escherichia coli NOT DETECTED NOT DETECTED Final   Klebsiella oxytoca NOT DETECTED NOT DETECTED Final   Klebsiella pneumoniae NOT DETECTED NOT DETECTED Final   Proteus species NOT DETECTED NOT DETECTED Final   Serratia marcescens  NOT DETECTED NOT DETECTED Final   Haemophilus influenzae NOT DETECTED NOT DETECTED Final   Neisseria meningitidis NOT DETECTED NOT DETECTED Final   Pseudomonas aeruginosa NOT DETECTED NOT DETECTED Final   Candida albicans NOT DETECTED NOT DETECTED Final   Candida glabrata NOT DETECTED NOT DETECTED Final   Candida krusei NOT DETECTED NOT DETECTED Final   Candida parapsilosis NOT DETECTED NOT DETECTED Final   Candida tropicalis NOT DETECTED NOT DETECTED Final    Comment: Performed at [facility], [address], [place], [place] [address]  Urine culture     Status: Abnormal   Collection Time: 07/07/19  2:31 PM   Specimen: Urine, Random  Result Value Ref Range Status   Specimen Description   Final    URINE, RANDOM Performed at [facility] [address] [place], [place] [address]    Special Requests   Final    NONE Performed at [facility], [place] [address] [place], [place] [address]    Culture (A)  Final    >=100,000 COLONIES/mL ESCHERICHIA COLI 70,000 COLONIES/mL KLEBSIELLA PNEUMONIAE    Report Status 07/10/2019 FINAL  Final   Organism ID, Bacteria ESCHERICHIA COLI (A)  Final   Organism ID, Bacteria KLEBSIELLA PNEUMONIAE (A)  Final      Susceptibility   Escherichia coli - MIC*    AMPICILLIN <=2 SENSITIVE Sensitive     CEFAZOLIN <=4 SENSITIVE Sensitive     CEFTRIAXONE <=1 SENSITIVE Sensitive     CIPROFLOXACIN <=0.25 SENSITIVE Sensitive     GENTAMICIN <=1 SENSITIVE Sensitive     IMIPENEM <=0.25 SENSITIVE Sensitive     NITROFURANTOIN <=16 SENSITIVE Sensitive     TRIMETH/SULFA <=20 SENSITIVE Sensitive     AMPICILLIN/SULBACTAM <=2 SENSITIVE Sensitive     PIP/TAZO <=4 SENSITIVE Sensitive     Extended ESBL NEGATIVE Sensitive     * >=100,000 COLONIES/mL ESCHERICHIA COLI   Klebsiella pneumoniae - MIC*    AMPICILLIN RESISTANT Resistant     CEFAZOLIN <=4 SENSITIVE Sensitive     CEFTRIAXONE <=1 SENSITIVE Sensitive     CIPROFLOXACIN  <=0.25 SENSITIVE Sensitive  GENTAMICIN <=1 SENSITIVE Sensitive     IMIPENEM <=0.25 SENSITIVE Sensitive     NITROFURANTOIN <=16 SENSITIVE Sensitive     TRIMETH/SULFA <=20 SENSITIVE Sensitive     AMPICILLIN/SULBACTAM <=2 SENSITIVE Sensitive     PIP/TAZO <=4 SENSITIVE Sensitive     Extended ESBL NEGATIVE Sensitive     * 70,000 COLONIES/mL KLEBSIELLA PNEUMONIAE  SARS Coronavirus 2 [facility] order, Performed in [facility] hospital lab) Nasopharyngeal Nasopharyngeal Swab     Status: None   Collection Time: 07/07/19  2:31 PM   Specimen: Nasopharyngeal Swab  Result Value Ref Range Status   SARS Coronavirus 2 NEGATIVE NEGATIVE Final    Comment: (NOTE) If result is NEGATIVE SARS-CoV-2 target nucleic acids are NOT DETECTED. The SARS-CoV-2 RNA is generally detectable in upper and lower  respiratory specimens during the acute phase of infection. The lowest  concentration of SARS-CoV-2 viral copies this assay can detect is 250  copies / mL. A negative result does not preclude SARS-CoV-2 infection  and should not be used as the sole basis for treatment or other  patient management decisions.  A negative result may occur with  improper specimen collection / handling, submission of specimen other  than nasopharyngeal swab, presence of viral mutation(s) within the  areas targeted by this assay, and inadequate number of viral copies  (<250 copies / mL). A negative result must be combined with clinical  observations, patient history, and epidemiological information. If result is POSITIVE SARS-CoV-2 target nucleic acids are DETECTED. The SARS-CoV-2 RNA is generally detectable in upper and lower  respiratory specimens dur ing the acute phase of infection.  Positive  results are indicative of active infection with SARS-CoV-2.  Clinical  correlation with patient history and other diagnostic information is  necessary to determine patient infection status.  Positive results do  not rule out bacterial  infection or co-infection with other viruses. If result is PRESUMPTIVE POSTIVE SARS-CoV-2 nucleic acids MAY BE PRESENT.   A presumptive positive result was obtained on the submitted specimen  and confirmed on repeat testing.  While 2019 novel coronavirus  (SARS-CoV-2) nucleic acids may be present in the submitted sample  additional confirmatory testing may be necessary for epidemiological  and / or clinical management purposes  to differentiate between  SARS-CoV-2 and other Sarbecovirus currently known to infect humans.  If clinically indicated additional testing with an alternate test  methodology [phone]) is advised. The SARS-CoV-2 RNA is generally  detectable in upper and lower respiratory sp ecimens during the acute  phase of infection. The expected result is Negative. Fact Sheet for Patients:  StrictlyIdeas.no Fact Sheet for Healthcare Providers: BankingDealers.co.za This test is not yet approved or cleared by the [place] FDA and has been authorized for detection and/or diagnosis of SARS-CoV-2 by FDA under an Emergency Use Authorization (EUA).  This EUA will remain in effect (meaning this test can be used) for the duration of the COVID-19 declaration under Section 564(b)(1) of the Act, 21 U.S.C. section 360bbb-3(b)(1), unless the authorization is terminated or revoked sooner. Performed at [facility], [place] [address], [place], [place] [address]   MRSA PCR Screening     Status: None   Collection Time: 07/08/19  5:40 AM   Specimen: Nasal Mucosa; Nasopharyngeal  Result Value Ref Range Status   MRSA by PCR NEGATIVE NEGATIVE Final    Comment:        The GeneXpert MRSA Assay (FDA approved for NASAL specimens only), is one component of a comprehensive MRSA  colonization surveillance program. It is not intended to diagnose MRSA infection nor to guide or monitor treatment for MRSA infections. Performed at  [facility], [place] [address], [place], [place] [address]   Culture, blood (Routine X 2) w Reflex to ID Panel     Status: None (Preliminary result)   Collection Time: 07/09/19  7:55 AM   Specimen: BLOOD LEFT HAND  Result Value Ref Range Status   Specimen Description BLOOD LEFT HAND  Final   Special Requests   Final    BOTTLES DRAWN AEROBIC ONLY Blood Culture adequate volume   Culture   Final    NO GROWTH 2 DAYS Performed at [facility], [place] [address] [place], [place] [address]    Report Status PENDING  Incomplete  Culture, blood (Routine X 2) w Reflex to ID Panel     Status: None (Preliminary result)   Collection Time: 07/09/19  7:56 AM   Specimen: BLOOD LEFT HAND  Result Value Ref Range Status   Specimen Description BLOOD LEFT HAND  Final   Special Requests   Final    BOTTLES DRAWN AEROBIC ONLY Blood Culture results may not be optimal due to an inadequate volume of blood received in culture bottles   Culture   Final    NO GROWTH 2 DAYS Performed at [facility], [place] [address], [place],  [address]    Report Status PENDING  Incomplete      Radiology Studies: No results found.      Scheduled Meds: . cephALEXin  500 mg Oral Q12H  . insulin aspart  0-9 Units Subcutaneous TID WC  . pantoprazole (PROTONIX) IV  40 mg Intravenous Q12H  . sodium chloride flush  3 mL Intravenous Once   Continuous Infusions: . sodium chloride 75 mL/hr at 07/10/19 1739  . [START ON 07/12/2019] ferumoxytol      Assessment & Plan:    1.  Acute renal failure with hyperkalemia/metabolic acidosis: Present on admission.  Likely secondary to ATN or prerenal in the setting of ACE inhibitor/diuretics/NSAID use.  Improving after holding medications and with IV hydration.  Continue to hold ACE inhibitor/diuretic/metformin.  Appreciate nephrology evaluation and follow-up.  They have now signed off.  2.  Elevated CK: Secondary to recent fall/injury versus statin induced.  Crestor  on hold.  CK peaked to 2774 and now down to 428.  3.  Anemia: Last hemoglobin from 2016 at 12.  Has been stable around 7.2 while here.  Status post 1 unit PRBC in the ED.  No further black stools noted.  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 Patient without bleeding or GI sx today and understands the need to avoid all NSAIDs.  Advance diet as tolerated.  PPI and f/u Dr Collene Mares in 2 weeks  4.  Hypertension: Home medications on hold due to problem #1.  Normotensive here in spite of IV hydration.  5.  Diabetes mellitus: Sliding scale insulin.  Metformin held due to problem #1 and metabolic acidosis.  Hemoglobin A1c at 5.5.  Can likely control with diet  6.  UTI: Urine culture grew E. coli.  Ceftriaxone changed to cephalexin to complete on 8/8  7. Hypokalemia: replace  DVT prophylaxis: SCDs given problem #3 Code Status: Full code Family / Patient Communication: Discussed with patient and all questions answered.d/w GI Disposition Plan: Seen by PT and recommended skilled nursing facility given two-person assist with  care of right upper extremity..  Case management to follow-up     LOS: 4 days    Time spent: 35 minutes    [place] Shi, MD [facility] Pager [phone]  If 7PM-7AM, please contact night-coverage [email or web address] Password [facility] 07/11/2019, 6:28 PM

## 2019-07-12 LAB — GLUCOSE, CAPILLARY
Glucose-Capillary: 92 mg/dL (ref 70–99)
Glucose-Capillary: 151 mg/dL — ABNORMAL HIGH (ref 70–99)
Glucose-Capillary: 107 mg/dL — ABNORMAL HIGH (ref 70–99)
Glucose-Capillary: 110 mg/dL — ABNORMAL HIGH (ref 70–99)

## 2019-07-12 LAB — RENAL FUNCTION PANEL
Albumin: 2.1 g/dL — ABNORMAL LOW (ref 3.5–5.0)
Anion gap: 12 (ref 5–15)
BUN: 26 mg/dL — ABNORMAL HIGH (ref 8–23)
CO2: 19 mmol/L — ABNORMAL LOW (ref 22–32)
Calcium: 7.8 mg/dL — ABNORMAL LOW (ref 8.9–10.3)
Chloride: 105 mmol/L (ref 98–111)
Creatinine, Ser: 2.02 mg/dL — ABNORMAL HIGH (ref 0.44–1.00)
GFR calc Af Amer: 27 mL/min — ABNORMAL LOW (ref >60)
GFR calc non Af Amer: 23 mL/min — ABNORMAL LOW (ref >60)
Glucose, Bld: 106 mg/dL — ABNORMAL HIGH (ref 70–99)
Phosphorus: 2.5 mg/dL (ref 2.5–4.6)
Potassium: 3.7 mmol/L (ref 3.5–5.1)
Sodium: 136 mmol/L (ref 135–145)

## 2019-07-12 LAB — CBC WITH DIFFERENTIAL/PLATELET
Abs Immature Granulocytes: 0.3 10*3/uL — ABNORMAL HIGH (ref 0.00–0.07)
Basophils Absolute: 0 10*3/uL (ref 0.0–0.1)
Basophils Relative: 0 %
Eosinophils Absolute: 0.1 10*3/uL (ref 0.0–0.5)
Eosinophils Relative: 1 %
HCT: 23.2 % — ABNORMAL LOW (ref 36.0–46.0)
Hemoglobin: 7.5 g/dL — ABNORMAL LOW (ref 12.0–15.0)
Lymphocytes Relative: 21 %
Lymphs Abs: 1.8 10*3/uL (ref 0.7–4.0)
MCH: 29.2 pg (ref 26.0–34.0)
MCHC: 32.3 g/dL (ref 30.0–36.0)
MCV: 90.3 fL (ref 80.0–100.0)
Monocytes Absolute: 0.3 10*3/uL (ref 0.1–1.0)
Monocytes Relative: 4 %
Myelocytes: 3 %
Neutro Abs: 6 10*3/uL (ref 1.7–7.7)
Neutrophils Relative %: 71 %
Platelets: 165 10*3/uL (ref 150–400)
RBC: 2.57 MIL/uL — ABNORMAL LOW (ref 3.87–5.11)
RDW: 13.2 % (ref 11.5–15.5)
WBC: 8.5 10*3/uL (ref 4.0–10.5)
nRBC: 0 % (ref 0.0–0.2)
nRBC: 0 /100 WBC

## 2019-07-12 LAB — CK: Total CK: 105 U/L (ref 38–234)

## 2019-07-12 LAB — CULTURE, BLOOD (ROUTINE X 2): Culture: NO GROWTH

## 2019-07-12 MED ORDER — LACTATED RINGERS IV SOLN
INTRAVENOUS | Status: DC
  Administered 2019-07-12: 18:00:00 via INTRAVENOUS

## 2019-07-12 NOTE — Progress Notes (Signed)
PROGRESS NOTE    Beth Garrison  OZH:[id number]  DOB: [birth year]  DOA: 07/07/2019 PCP: System, Pcp Not In  Brief Narrative:   78 y.o. F with DM, HTN, and CKD III baseline Cr 1.2 who presented with weakness, SOB progressive over weeks.She also was noted to have brownish-black liquidy stool in the ED and lab work showed anemia.  GI consulted.work-up in the ED revealed AKI with creatinine 6.6, and hyperkalemia with K 7.5 She states she slipped and fell while walking out of the bathroom, 5 to 6 days prior to admission hurting her wrist.  She was taking acetaminophen and Advil for pain and had a soft brace.  Patient found to have wrist fracture right arm by x-ray.Patient now has right sugar tong splint placed in the ED per orthopedic recommendations.  Patient's home medications including lisinopril, HCTZ and metformin were held.  She was admitted to hospitalist service with GI/nephrology consultations.  Subjective:   Patient tolerating soft diet.   Ate 70% today Objective: Vitals:   07/11/19 1301 07/11/19 2339 07/12/19 0645 07/12/19 1237  BP: (!) 127/50 (!) 134/52 (!) 138/44 (!) 138/50  Pulse: 69 80 74 66  Resp: 18 16 16    Temp: 98.3 F (36.8 C) 98.4 F (36.9 C) 97.9 F (36.6 C) 98.6 F (37 C)  TempSrc: Oral Oral Oral Oral  SpO2: 96% 96% 97% 100%  Weight:   86.3 kg   Height:        Intake/Output Summary (Last 24 hours) at 07/12/2019 1315 Last data filed at 07/12/2019 0500 Gross per 24 hour  Intake 360 ml  Output 1375 ml  Net -1015 ml   Filed Weights   07/09/19 0319 07/10/19 1350 07/12/19 0645  Weight: 79.4 kg 80 kg 86.3 kg    Physical Examination:  General exam: Appears calm and comfortable  Respiratory system: Clear to auscultation. Respiratory effort normal. Cardiovascular system: S1 & S2 heard,?  Mild systolic murmur. No pedal edema. Gastrointestinal system: Abdomen is nondistended, soft and nontender. No organomegaly or masses felt. Normal bowel sounds heard. Central  nervous system: Alert and oriented. No focal neurological deficits. Extremities: Splint along right wrist/forearm Skin: No rashes, lesions or ulcers Psychiatry: Judgement and insight appear normal. Mood & affect appropriate.     Data Reviewed: I have personally reviewed following labs and imaging studies  CBC: Recent Labs  Lab 07/07/19 1425 07/09/19 0318 07/10/19 0239 07/11/19 0454 07/12/19 0243  WBC 9.5 8.7 9.0 7.7 8.5  NEUTROABS 7.8* 6.2 6.3 4.9 6.0  HGB 7.8* 7.9* 7.4* 7.5* 7.5*  HCT 26.0* 23.4* 22.9* 23.1* 23.2*  MCV 97.0 87.0 90.5 91.7 90.3  PLT 235 186 167 161 629   Basic Metabolic Panel: Recent Labs  Lab 07/08/19 0610 07/09/19 0318 07/10/19 0239 07/11/19 0454 07/12/19 0243  NA 143 142 140 139 136  K 5.0 3.4* 3.2* 3.1* 3.7  CL 116* 103 105 106 105  CO2 13* 25 25 22  19*  GLUCOSE 161* 226* 143* 119* 106*  BUN 93* 63* 45* 34* 26*  CREATININE 5.28* 3.23* 2.50* 2.10* 2.02*  CALCIUM 8.6* 7.7* 7.2* 7.5* 7.8*  PHOS 6.1* 3.3 3.8 3.4 2.5   GFR: Estimated Creatinine Clearance: 25.7 mL/min (A) (by C-G formula based on SCr of 2.02 mg/dL (H)). Liver Function Tests: Recent Labs  Lab 07/07/19 1425  07/08/19 0610 07/09/19 0318 07/10/19 0239 07/11/19 0454 07/12/19 0243  AST 45*  --  37  --   --   --   --   ALT 21  --  20  --   --   --   --   ALKPHOS 120  --  97  --   --   --   --   BILITOT 0.5  --  0.6  --   --   --   --   PROT 7.0  --  5.4*  --   --   --   --   ALBUMIN 3.5   < > 2.7*   2.8* 2.4* 2.1* 2.0* 2.1*   < > = values in this interval not displayed.   No results for input(s): LIPASE, AMYLASE in the last 168 hours. No results for input(s): AMMONIA in the last 168 hours. Coagulation Profile: Recent Labs  Lab 07/07/19 1425  INR 1.4*   Cardiac Enzymes: Recent Labs  Lab 07/07/19 2018 07/09/19 0318 07/10/19 0239 07/11/19 0454 07/12/19 0243  CKTOTAL 2,774* 873* 428* 202 105   BNP (last 3 results) No results for input(s): PROBNP in the last 8760  hours. HbA1C: No results for input(s): HGBA1C in the last 72 hours. CBG: Recent Labs  Lab 07/11/19 0802 07/11/19 1258 07/11/19 1732 07/12/19 0829 07/12/19 1236  GLUCAP 109* 148* 88 92 151*   Lipid Profile: No results for input(s): CHOL, HDL, LDLCALC, TRIG, CHOLHDL, LDLDIRECT in the last 72 hours. Thyroid Function Tests: No results for input(s): TSH, T4TOTAL, FREET4, T3FREE, THYROIDAB in the last 72 hours. Anemia Panel: No results for input(s): VITAMINB12, FOLATE, FERRITIN, TIBC, IRON, RETICCTPCT in the last 72 hours. Sepsis Labs: Recent Labs  Lab 07/07/19 1425  LATICACIDVEN 0.7    Recent Results (from the past 240 hour(s))  Culture, blood (Routine x 2)     Status: None   Collection Time: 07/07/19  2:25 PM   Specimen: BLOOD  Result Value Ref Range Status   Specimen Description   Final    BLOOD LEFT ANTECUBITAL Performed at [facility] [address], [place], [place] [address]    Special Requests   Final    BOTTLES DRAWN AEROBIC AND ANAEROBIC Blood Culture results may not be optimal due to an excessive volume of blood received in culture bottles Performed at [facility] [address] [place], [place] [address]    Culture   Final    NO GROWTH 5 DAYS Performed at [facility], [place] [address] [place], [place] [address]    Report Status 07/12/2019 FINAL  Final  Culture, blood (Routine x 2)     Status: Abnormal   Collection Time: 07/07/19  2:25 PM   Specimen: BLOOD LEFT HAND  Result Value Ref Range Status   Specimen Description   Final    BLOOD LEFT HAND Performed at [facility] [address] [place], [place] [address]    Special Requests   Final    BOTTLES DRAWN AEROBIC AND ANAEROBIC Blood Culture results may not be optimal due to an excessive volume of blood received in culture bottles Performed at [facility] [address], [place], [place] [address]    Culture  Setup  Time   Final    GRAM POSITIVE COCCI AEROBIC BOTTLE ONLY CRITICAL RESULT CALLED TO, READ BACK BY AND VERIFIED WITH: Dawson Bills PharmD 15:40 07/08/19 ([email or web address])    Culture (A)  Final    STAPHYLOCOCCUS SPECIES (COAGULASE NEGATIVE) THE SIGNIFICANCE OF ISOLATING THIS ORGANISM FROM A SINGLE SET OF BLOOD CULTURES WHEN MULTIPLE SETS ARE DRAWN IS UNCERTAIN. PLEASE NOTIFY THE MICROBIOLOGY DEPARTMENT WITHIN ONE  WEEK IF SPECIATION AND SENSITIVITIES ARE REQUIRED. Performed at [facility], [place] [address], [place], [place] [address]    Report Status 07/10/2019 FINAL  Final  Blood Culture ID Panel (Reflexed)     Status: Abnormal   Collection Time: 07/07/19  2:25 PM  Result Value Ref Range Status   Enterococcus species NOT DETECTED NOT DETECTED Final   Listeria monocytogenes NOT DETECTED NOT DETECTED Final   Staphylococcus species DETECTED (A) NOT DETECTED Final    Comment: Methicillin (oxacillin) susceptible coagulase negative staphylococcus. Possible blood culture contaminant (unless isolated from more than one blood culture draw or clinical case suggests pathogenicity). No antibiotic treatment is indicated for blood  culture contaminants. CRITICAL RESULT CALLED TO, READ BACK BY AND VERIFIED WITH: Dawson Bills PharmD 15:40 07/08/19 ([email or web address])    Staphylococcus aureus (BCID) NOT DETECTED NOT DETECTED Final   Methicillin resistance NOT DETECTED NOT DETECTED Final   Streptococcus species NOT DETECTED NOT DETECTED Final   Streptococcus agalactiae NOT DETECTED NOT DETECTED Final   Streptococcus pneumoniae NOT DETECTED NOT DETECTED Final   Streptococcus pyogenes NOT DETECTED NOT DETECTED Final   Acinetobacter baumannii NOT DETECTED NOT DETECTED Final   Enterobacteriaceae species NOT DETECTED NOT DETECTED Final   Enterobacter cloacae complex NOT DETECTED NOT DETECTED Final   Escherichia coli NOT DETECTED NOT DETECTED Final   Klebsiella oxytoca NOT DETECTED NOT DETECTED Final   Klebsiella pneumoniae NOT  DETECTED NOT DETECTED Final   Proteus species NOT DETECTED NOT DETECTED Final   Serratia marcescens NOT DETECTED NOT DETECTED Final   Haemophilus influenzae NOT DETECTED NOT DETECTED Final   Neisseria meningitidis NOT DETECTED NOT DETECTED Final   Pseudomonas aeruginosa NOT DETECTED NOT DETECTED Final   Candida albicans NOT DETECTED NOT DETECTED Final   Candida glabrata NOT DETECTED NOT DETECTED Final   Candida krusei NOT DETECTED NOT DETECTED Final   Candida parapsilosis NOT DETECTED NOT DETECTED Final   Candida tropicalis NOT DETECTED NOT DETECTED Final    Comment: Performed at [facility], [address], [place], [place] [address]  Urine culture     Status: Abnormal   Collection Time: 07/07/19  2:31 PM   Specimen: Urine, Random  Result Value Ref Range Status   Specimen Description   Final    URINE, RANDOM Performed at [facility] [address] [place], [place] [address]    Special Requests   Final    NONE Performed at [facility], [place] [address], [place], [place] [address]    Culture (A)  Final    >=100,000 COLONIES/mL ESCHERICHIA COLI 70,000 COLONIES/mL KLEBSIELLA PNEUMONIAE    Report Status 07/10/2019 FINAL  Final   Organism ID, Bacteria ESCHERICHIA COLI (A)  Final   Organism ID, Bacteria KLEBSIELLA PNEUMONIAE (A)  Final      Susceptibility   Escherichia coli - MIC*    AMPICILLIN <=2 SENSITIVE Sensitive     CEFAZOLIN <=4 SENSITIVE Sensitive     CEFTRIAXONE <=1 SENSITIVE Sensitive     CIPROFLOXACIN <=0.25 SENSITIVE Sensitive     GENTAMICIN <=1 SENSITIVE Sensitive     IMIPENEM <=0.25 SENSITIVE Sensitive     NITROFURANTOIN <=16 SENSITIVE Sensitive     TRIMETH/SULFA <=20 SENSITIVE Sensitive     AMPICILLIN/SULBACTAM <=2 SENSITIVE Sensitive     PIP/TAZO <=4 SENSITIVE Sensitive     Extended ESBL NEGATIVE Sensitive     * >=100,000 COLONIES/mL ESCHERICHIA COLI   Klebsiella pneumoniae - MIC*    AMPICILLIN RESISTANT Resistant  CEFAZOLIN <=4 SENSITIVE Sensitive     CEFTRIAXONE <=1 SENSITIVE Sensitive     CIPROFLOXACIN <=0.25 SENSITIVE Sensitive     GENTAMICIN <=1 SENSITIVE Sensitive     IMIPENEM <=0.25 SENSITIVE Sensitive     NITROFURANTOIN <=16 SENSITIVE Sensitive     TRIMETH/SULFA <=20 SENSITIVE Sensitive     AMPICILLIN/SULBACTAM <=2 SENSITIVE Sensitive     PIP/TAZO <=4 SENSITIVE Sensitive     Extended ESBL NEGATIVE Sensitive     * 70,000 COLONIES/mL KLEBSIELLA PNEUMONIAE  SARS Coronavirus 2 [facility] order, Performed in [facility] hospital lab) Nasopharyngeal Nasopharyngeal Swab     Status: None   Collection Time: 07/07/19  2:31 PM   Specimen: Nasopharyngeal Swab  Result Value Ref Range Status   SARS Coronavirus 2 NEGATIVE NEGATIVE Final    Comment: (NOTE) If result is NEGATIVE SARS-CoV-2 target nucleic acids are NOT DETECTED. The SARS-CoV-2 RNA is generally detectable in upper and lower  respiratory specimens during the acute phase of infection. The lowest  concentration of SARS-CoV-2 viral copies this assay can detect is 250  copies / mL. A negative result does not preclude SARS-CoV-2 infection  and should not be used as the sole basis for treatment or other  patient management decisions.  A negative result may occur with  improper specimen collection / handling, submission of specimen other  than nasopharyngeal swab, presence of viral mutation(s) within the  areas targeted by this assay, and inadequate number of viral copies  (<250 copies / mL). A negative result must be combined with clinical  observations, patient history, and epidemiological information. If result is POSITIVE SARS-CoV-2 target nucleic acids are DETECTED. The SARS-CoV-2 RNA is generally detectable in upper and lower  respiratory specimens dur ing the acute phase of infection.  Positive  results are indicative of active infection with SARS-CoV-2.  Clinical  correlation with patient history and other diagnostic information is   necessary to determine patient infection status.  Positive results do  not rule out bacterial infection or co-infection with other viruses. If result is PRESUMPTIVE POSTIVE SARS-CoV-2 nucleic acids MAY BE PRESENT.   A presumptive positive result was obtained on the submitted specimen  and confirmed on repeat testing.  While 2019 novel coronavirus  (SARS-CoV-2) nucleic acids may be present in the submitted sample  additional confirmatory testing may be necessary for epidemiological  and / or clinical management purposes  to differentiate between  SARS-CoV-2 and other Sarbecovirus currently known to infect humans.  If clinically indicated additional testing with an alternate test  methodology [phone]) is advised. The SARS-CoV-2 RNA is generally  detectable in upper and lower respiratory sp ecimens during the acute  phase of infection. The expected result is Negative. Fact Sheet for Patients:  StrictlyIdeas.no Fact Sheet for Healthcare Providers: BankingDealers.co.za This test is not yet approved or cleared by the [place] FDA and has been authorized for detection and/or diagnosis of SARS-CoV-2 by FDA under an Emergency Use Authorization (EUA).  This EUA will remain in effect (meaning this test can be used) for the duration of the COVID-19 declaration under Section 564(b)(1) of the Act, 21 U.S.C. section 360bbb-3(b)(1), unless the authorization is terminated or revoked sooner. Performed at [facility], [place] [address], [place], [place] [address]   MRSA PCR Screening     Status: None   Collection Time: 07/08/19  5:40 AM   Specimen: Nasal Mucosa; Nasopharyngeal  Result Value Ref Range Status   MRSA by PCR NEGATIVE NEGATIVE Final    Comment:  The GeneXpert MRSA Assay (FDA approved for NASAL specimens only), is one component of a comprehensive MRSA colonization surveillance program. It is not intended to  diagnose MRSA infection nor to guide or monitor treatment for MRSA infections. Performed at [facility], [place] [address] [place], [place] [address]   Culture, blood (Routine X 2) w Reflex to ID Panel     Status: None (Preliminary result)   Collection Time: 07/09/19  7:55 AM   Specimen: BLOOD LEFT HAND  Result Value Ref Range Status   Specimen Description BLOOD LEFT HAND  Final   Special Requests   Final    BOTTLES DRAWN AEROBIC ONLY Blood Culture adequate volume   Culture   Final    NO GROWTH 3 DAYS Performed at [facility], [place] [address], [place], [place] [address]    Report Status PENDING  Incomplete  Culture, blood (Routine X 2) w Reflex to ID Panel     Status: None (Preliminary result)   Collection Time: 07/09/19  7:56 AM   Specimen: BLOOD LEFT HAND  Result Value Ref Range Status   Specimen Description BLOOD LEFT HAND  Final   Special Requests   Final    BOTTLES DRAWN AEROBIC ONLY Blood Culture results may not be optimal due to an inadequate volume of blood received in culture bottles   Culture   Final    NO GROWTH 3 DAYS Performed at [facility], [place] [address] [place], [place] [address]    Report Status PENDING  Incomplete      Radiology Studies: No results found.      Scheduled Meds:  cephALEXin  500 mg Oral Q12H   insulin aspart  0-9 Units Subcutaneous TID WC   pantoprazole (PROTONIX) IV  40 mg Intravenous Q12H   sodium chloride flush  3 mL Intravenous Once   Continuous Infusions:  sodium chloride 75 mL/hr at 07/12/19 0022    Assessment & Plan:    1.  Acute renal failure with hyperkalemia/metabolic acidosis: Present on admission.  Likely secondary to ATN or prerenal in the setting of ACE inhibitor/diuretics/NSAID use.  Creatinine level improving after holding medications and with IV hydration (6.6-->5.28-->3.2-->2.0). Will hydrate over the weekend and plan to d/c IV fluids once INR close to 1.5. Continue to encourage oral intake  and hold ACE inhibitor/diuretic/metformin.  Appreciate nephrology evaluation and follow-up.  They have now signed off.  2.  Elevated CK: Secondary to recent fall/injury versus statin induced.  Crestor on hold.  CK peaked to 2774 and  down to 428 with IV hydration  3.  Anemia: Last hemoglobin from 2016 at 12.  Has been stable around 7.2 while here.  Status post 1 unit PRBC in the ED.  No further black stools noted.  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 Patient without bleeding or GI sx today and understands the need to avoid all NSAIDs.  Advance diet as tolerated.  PPI and f/u Dr Collene Mares in 2 weeks  4.  Hypertension: Home medications on hold due to problem #1.  Normotensive here in spite of IV hydration.  5.  Diabetes mellitus: Sliding scale insulin.  Metformin held due to problem #1 and metabolic acidosis.  Hemoglobin A1c at 5.5.  Can likely control with diet  6.  UTI: Urine culture grew E. coli.  Ceftriaxone changed to cephalexin to complete on 8/8  7. Hypokalemia: replace  DVT prophylaxis:  SCDs given problem #3 Code Status: Full code Family / Patient Communication: Discussed with patient and all questions answered.d/w GI Disposition Plan: Seen by PT and recommended skilled nursing facility given two-person assist with care of right upper extremity..  Case management to follow-up     LOS: 5 days    Time spent: 35 minutes    [place] Shi, MD [facility] Pager [phone]  If 7PM-7AM, please contact night-coverage [email or web address] Password [email or web address] 07/12/2019, 1:15 PM

## 2019-07-12 NOTE — TOC Initial Note (Signed)
Transition of Care [facility]) - Initial/Assessment Note    Patient Details  Name: Beth Garrison MRN: [id number] Date of Birth: 12/03/1941  Transition of [facility]) CM/SW Contact:    Candie Chroman, LCSW Phone Number: 07/12/2019, 1:20 PM  Clinical Narrative: CSW met with patient, introduced role, and explained that PT recommendations. Patient is agreeable to SNF. CSW gave list of CMS scores for facilities within 25 miles of her zip code. Beth Garrison is first preference. Admissions coordinator is aware and will review referral. Patient called her daughter so she could be notified. No further concerns. CSW encouraged patient and her daughter to contact CSW as needed. CSW will continue to follow patient and her daughter for support and facilitate discharge to SNF once medically stable.                 Expected Discharge Plan: Skilled Nursing Facility Barriers to Discharge: Continued Medical Work up   Patient Goals and CMS Choice Patient states their goals for this hospitalization and ongoing recovery are:: "To walk." CMS Medicare.gov Compare Post Acute Care list provided to:: Patient    Expected Discharge Plan and Services Expected Discharge Plan: [place] Choice: [place] arrangements for the past 2 months: Single Family Home                                      Prior Living Arrangements/Services Living arrangements for the past 2 months: Single Family Home Lives with:: Adult Children Patient language and need for interpreter reviewed:: Yes Do you feel safe going back to the place where you live?: Yes      Need for Family Participation in Patient Care: Yes (Comment) Care giver support system in place?: Yes (comment)   Criminal Activity/Legal Involvement Pertinent to Current Situation/Hospitalization: No - Comment as needed  Activities of Daily Living Home Assistive Devices/Equipment: Beth Garrison (specify type) ADL  Screening (condition at time of admission) Patient's cognitive ability adequate to safely complete daily activities?: Yes Is the patient deaf or have difficulty hearing?: No Does the patient have difficulty seeing, even when wearing glasses/contacts?: No Does the patient have difficulty concentrating, remembering, or making decisions?: No Patient able to express need for assistance with ADLs?: Yes Does the patient have difficulty dressing or bathing?: No Independently performs ADLs?: No Does the patient have difficulty walking or climbing stairs?: Yes Weakness of Legs: None Weakness of Arms/Hands: Right  Permission Sought/Granted Permission sought to share information with : Facility Art therapist granted to share information with : Yes, Verbal Permission Granted  Share Information with NAME: Beth Garrison  Permission granted to share info w AGENCY: SNF's  Permission granted to share info w Relationship: Daughter  Permission granted to share info w Contact Information: [phone]  Emotional Assessment Appearance:: Appears stated age Attitude/Demeanor/Rapport: Engaged, Gracious Affect (typically observed): Accepting, Appropriate, Calm, Pleasant Orientation: : Oriented to Self, Oriented to Place, Oriented to  Time, Oriented to Situation Alcohol / Substance Use: Never Used Psych Involvement: No (comment)  Admission diagnosis:  Hyperkalemia [E87.5] AKI (acute kidney injury) ([place]) [N17.9] Symptomatic anemia [D64.9] Closed fracture of right wrist, initial encounter [S62.101A] Renal failure [N19] Patient Active Problem List   Diagnosis Date Noted  . Renal failure 07/07/2019  . AKI (acute kidney injury) ([place]) 07/07/2019  . Hyperkalemia 07/07/2019  . Anemia 07/07/2019  . Melena 07/07/2019  . Routine  general medical examination at a health care facility 03/23/2014  . Other screening mammogram 03/23/2014  . Type II diabetes mellitus with manifestations ([place])  09/08/2013  . Hyperlipidemia with target LDL less than 100 09/08/2013  . HTN (hypertension) 09/08/2013   PCP:  System, Pcp Not In Pharmacy:   [place], [place]. [place]. [place] [address] Phone: [phone] Fax: [phone]     Social Determinants of Health (SDOH) Interventions    Readmission Risk Interventions No flowsheet data found.

## 2019-07-12 NOTE — NC FL2 (Signed)
[place] LEVEL OF CARE SCREENING TOOL     IDENTIFICATION  Patient Name: Beth Garrison Birthdate: May 18, 1941 Sex: female Admission Date (Current Location): 07/07/2019  [facility] and [place] Number:  Herbalist and Address:  [facility], [place] [address] [place], [place] [address]      Provider Number: [id number]  Attending Physician Name and Address:  Guilford Shi, MD  Relative Name and Phone Number:       Current Level of Care: Hospital Recommended Level of Care: [place] Prior Approval Number:    Date Approved/Denied:   PASRR Number: [id number]  Discharge Plan: SNF    Current Diagnoses: Patient Active Problem List   Diagnosis Date Noted  . Renal failure 07/07/2019  . AKI (acute kidney injury) ([place]) 07/07/2019  . Hyperkalemia 07/07/2019  . Anemia 07/07/2019  . Melena 07/07/2019  . Routine general medical examination at a health care facility 03/23/2014  . Other screening mammogram 03/23/2014  . Type II diabetes mellitus with manifestations ([place]) 09/08/2013  . Hyperlipidemia with target LDL less than 100 09/08/2013  . HTN (hypertension) 09/08/2013    Orientation RESPIRATION BLADDER Height & Weight     Self, Time, Situation, Place  Normal Incontinent Weight: 190 lb 4.1 oz (86.3 kg) Height:  5\' 5"  (165.1 cm)  BEHAVIORAL SYMPTOMS/MOOD NEUROLOGICAL BOWEL NUTRITION STATUS  (None)   Continent Diet(Soft. Carb modified.)  AMBULATORY STATUS COMMUNICATION OF NEEDS Skin   Extensive Assist Verbally Bruising                       Personal Care Assistance Level of Assistance              Functional Limitations Info  Sight, Hearing, Speech Sight Info: Adequate Hearing Info: Adequate Speech Info: Adequate    SPECIAL CARE FACTORS FREQUENCY  PT (By licensed PT)     PT Frequency: 5 x week              Contractures Contractures Info: Not present    Additional Factors Info  Code Status,  Allergies Code Status Info: Full code Allergies Info: NKDA           Current Medications (07/12/2019):  This is the current hospital active medication list Current Facility-Administered Medications  Medication Dose Route Frequency Provider Last Rate Last Dose  . acetaminophen (TYLENOL) tablet 650 mg  650 mg Oral Q6H PRN Regalado, Belkys A, MD   650 mg at 07/09/19 2216   Or  . acetaminophen (TYLENOL) suppository 650 mg  650 mg Rectal Q6H PRN Regalado, Belkys A, MD      . cephALEXin (KEFLEX) capsule 500 mg  500 mg Oral Q12H Kamineni, Neelima, MD   500 mg at 07/12/19 0930  . insulin aspart (novoLOG) injection 0-9 Units  0-9 Units Subcutaneous TID WC Regalado, Belkys A, MD   2 Units at 07/12/19 1312  . ondansetron (ZOFRAN) tablet 4 mg  4 mg Oral Q6H PRN Regalado, Belkys A, MD   4 mg at 07/11/19 1327   Or  . ondansetron (ZOFRAN) injection 4 mg  4 mg Intravenous Q6H PRN Regalado, Belkys A, MD   4 mg at 07/10/19 0853  . pantoprazole (PROTONIX) injection 40 mg  40 mg Intravenous Q12H Regalado, Belkys A, MD   40 mg at 07/12/19 0930  . sodium chloride flush (NS) 0.9 % injection 3 mL  3 mL Intravenous Once Tegeler, Gwenyth Allegra, MD  Discharge Medications: Please see discharge summary for a list of discharge medications.  Relevant Imaging Results:  Relevant Lab Results:   Additional Information SS#: [id number]  Candie Chroman, LCSW

## 2019-07-13 LAB — CBC WITH DIFFERENTIAL/PLATELET
Abs Immature Granulocytes: 0.57 10*3/uL — ABNORMAL HIGH (ref 0.00–0.07)
Basophils Absolute: 0 10*3/uL (ref 0.0–0.1)
Basophils Relative: 1 %
Eosinophils Absolute: 0.2 10*3/uL (ref 0.0–0.5)
Eosinophils Relative: 3 %
HCT: 24.3 % — ABNORMAL LOW (ref 36.0–46.0)
Hemoglobin: 7.9 g/dL — ABNORMAL LOW (ref 12.0–15.0)
Immature Granulocytes: 7 %
Lymphocytes Relative: 22 %
Lymphs Abs: 1.8 10*3/uL (ref 0.7–4.0)
MCH: 29.6 pg (ref 26.0–34.0)
MCHC: 32.5 g/dL (ref 30.0–36.0)
MCV: 91 fL (ref 80.0–100.0)
Monocytes Absolute: 0.6 10*3/uL (ref 0.1–1.0)
Monocytes Relative: 8 %
Neutro Abs: 4.9 10*3/uL (ref 1.7–7.7)
Neutrophils Relative %: 59 %
Platelets: 161 10*3/uL (ref 150–400)
RBC: 2.67 MIL/uL — ABNORMAL LOW (ref 3.87–5.11)
RDW: 13.1 % (ref 11.5–15.5)
WBC: 8.1 10*3/uL (ref 4.0–10.5)
nRBC: 0 % (ref 0.0–0.2)

## 2019-07-13 LAB — RENAL FUNCTION PANEL
Albumin: 2.2 g/dL — ABNORMAL LOW (ref 3.5–5.0)
Anion gap: 9 (ref 5–15)
BUN: 21 mg/dL (ref 8–23)
CO2: 20 mmol/L — ABNORMAL LOW (ref 22–32)
Calcium: 8.2 mg/dL — ABNORMAL LOW (ref 8.9–10.3)
Chloride: 106 mmol/L (ref 98–111)
Creatinine, Ser: 1.63 mg/dL — ABNORMAL HIGH (ref 0.44–1.00)
GFR calc Af Amer: 35 mL/min — ABNORMAL LOW (ref >60)
GFR calc non Af Amer: 30 mL/min — ABNORMAL LOW (ref >60)
Glucose, Bld: 112 mg/dL — ABNORMAL HIGH (ref 70–99)
Phosphorus: 2.8 mg/dL (ref 2.5–4.6)
Potassium: 4.1 mmol/L (ref 3.5–5.1)
Sodium: 135 mmol/L (ref 135–145)

## 2019-07-13 LAB — GLUCOSE, CAPILLARY
Glucose-Capillary: 100 mg/dL — ABNORMAL HIGH (ref 70–99)
Glucose-Capillary: 178 mg/dL — ABNORMAL HIGH (ref 70–99)
Glucose-Capillary: 112 mg/dL — ABNORMAL HIGH (ref 70–99)
Glucose-Capillary: 138 mg/dL — ABNORMAL HIGH (ref 70–99)

## 2019-07-13 LAB — CK: Total CK: 56 U/L (ref 38–234)

## 2019-07-13 MED ORDER — PANTOPRAZOLE SODIUM 40 MG PO TBEC
40.0000 mg | DELAYED_RELEASE_TABLET | Freq: Two times a day (BID) | ORAL | Status: DC
  Administered 2019-07-13 – 2019-07-16 (×8): 40 mg via ORAL
  Filled 2019-07-13 (×8): qty 1

## 2019-07-13 NOTE — Progress Notes (Addendum)
PROGRESS NOTE    Beth Garrison  ONG:[id number]  DOB: June 13, 1941  DOA: 07/07/2019 PCP: System, Pcp Not In  Brief Narrative:   78 y.o. F with DM, HTN, and CKD III baseline Cr 1.2 who presented with weakness, SOB progressive over weeks.She also was noted to have brownish-black liquidy stool in the ED and lab work showed anemia.  GI consulted.work-up in the ED revealed AKI with creatinine 6.6, and hyperkalemia with K 7.5 She states she slipped and fell while walking out of the bathroom, 5 to 6 days prior to admission hurting her wrist.  She was taking acetaminophen and Advil for pain and had a soft brace.  Patient found to have wrist fracture right arm by x-ray.Patient now has right sugar tong splint placed in the ED per orthopedic recommendations.  Patient's home medications including lisinopril, HCTZ and metformin were held.  She was admitted to hospitalist service with GI/nephrology consultations.  Subjective:   Patient tolerating soft diet well.  Still on IV fluids and tolerating well without any complaints of dyspnea or hypoxia..  Objective: Vitals:   07/12/19 1237 07/12/19 2130 07/13/19 0604 07/13/19 1210  BP: (!) 138/50 (!) 157/63 (!) 152/62 (!) 143/58  Pulse: 66 75 67 77  Resp:  16 16 16   Temp: 98.6 F (37 C) 99 F (37.2 C) 98.6 F (37 C) 98.7 F (37.1 C)  TempSrc: Oral Oral Oral Oral  SpO2: 100% 96% 98% 97%  Weight:      Height:        Intake/Output Summary (Last 24 hours) at 07/13/2019 1655 Last data filed at 07/13/2019 4401 Gross per 24 hour  Intake 256.13 ml  Output 350 ml  Net -93.87 ml   Filed Weights   07/09/19 0319 07/10/19 1350 07/12/19 0645  Weight: 79.4 kg 80 kg 86.3 kg    Physical Examination:  General exam: Appears calm and comfortable  Respiratory system: Clear to auscultation. Respiratory effort normal. Cardiovascular system: S1 & S2 heard,?  Mild systolic murmur. No pedal edema. Gastrointestinal system: Abdomen is nondistended, soft and nontender. No  organomegaly or masses felt. Normal bowel sounds heard. Central nervous system: Alert and oriented. No focal neurological deficits. Extremities: Splint along right wrist/forearm Skin: No rashes, lesions or ulcers Psychiatry: Judgement and insight appear normal. Mood & affect appropriate.     Data Reviewed: I have personally reviewed following labs and imaging studies  CBC: Recent Labs  Lab 07/09/19 0318 07/10/19 0239 07/11/19 0454 07/12/19 0243 07/13/19 0552  WBC 8.7 9.0 7.7 8.5 8.1  NEUTROABS 6.2 6.3 4.9 6.0 4.9  HGB 7.9* 7.4* 7.5* 7.5* 7.9*  HCT 23.4* 22.9* 23.1* 23.2* 24.3*  MCV 87.0 90.5 91.7 90.3 91.0  PLT 186 167 161 165 027   Basic Metabolic Panel: Recent Labs  Lab 07/09/19 0318 07/10/19 0239 07/11/19 0454 07/12/19 0243 07/13/19 0552  NA 142 140 139 136 135  K 3.4* 3.2* 3.1* 3.7 4.1  CL 103 105 106 105 106  CO2 25 25 22  19* 20*  GLUCOSE 226* 143* 119* 106* 112*  BUN 63* 45* 34* 26* 21  CREATININE 3.23* 2.50* 2.10* 2.02* 1.63*  CALCIUM 7.7* 7.2* 7.5* 7.8* 8.2*  PHOS 3.3 3.8 3.4 2.5 2.8   GFR: Estimated Creatinine Clearance: 31.8 mL/min (A) (by C-G formula based on SCr of 1.63 mg/dL (H)). Liver Function Tests: Recent Labs  Lab 07/07/19 1425  07/08/19 0610 07/09/19 0318 07/10/19 0239 07/11/19 0454 07/12/19 0243 07/13/19 0552  AST 45*  --  37  --   --   --   --   --  ALT 21  --  20  --   --   --   --   --   ALKPHOS 120  --  97  --   --   --   --   --   BILITOT 0.5  --  0.6  --   --   --   --   --   PROT 7.0  --  5.4*  --   --   --   --   --   ALBUMIN 3.5   < > 2.7*  2.8* 2.4* 2.1* 2.0* 2.1* 2.2*   < > = values in this interval not displayed.   No results for input(s): LIPASE, AMYLASE in the last 168 hours. No results for input(s): AMMONIA in the last 168 hours. Coagulation Profile: Recent Labs  Lab 07/07/19 1425  INR 1.4*   Cardiac Enzymes: Recent Labs  Lab 07/09/19 0318 07/10/19 0239 07/11/19 0454 07/12/19 0243 07/13/19 0552   CKTOTAL 873* 428* 202 105 56   BNP (last 3 results) No results for input(s): PROBNP in the last 8760 hours. HbA1C: No results for input(s): HGBA1C in the last 72 hours. CBG: Recent Labs  Lab 07/12/19 1236 07/12/19 1719 07/12/19 2131 07/13/19 0813 07/13/19 1208  GLUCAP 151* 107* 110* 100* 178*   Lipid Profile: No results for input(s): CHOL, HDL, LDLCALC, TRIG, CHOLHDL, LDLDIRECT in the last 72 hours. Thyroid Function Tests: No results for input(s): TSH, T4TOTAL, FREET4, T3FREE, THYROIDAB in the last 72 hours. Anemia Panel: No results for input(s): VITAMINB12, FOLATE, FERRITIN, TIBC, IRON, RETICCTPCT in the last 72 hours. Sepsis Labs: Recent Labs  Lab 07/07/19 1425  LATICACIDVEN 0.7    Recent Results (from the past 240 hour(s))  Culture, blood (Routine x 2)     Status: None   Collection Time: 07/07/19  2:25 PM   Specimen: BLOOD  Result Value Ref Range Status   Specimen Description   Final    BLOOD LEFT ANTECUBITAL Performed at [facility] [address], [place], [place] [address]    Special Requests   Final    BOTTLES DRAWN AEROBIC AND ANAEROBIC Blood Culture results may not be optimal due to an excessive volume of blood received in culture bottles Performed at [facility] [address], [place], [place] [address]    Culture   Final    NO GROWTH 5 DAYS Performed at [facility], [place] [address], [place], [place] [address]    Report Status 07/12/2019 FINAL  Final  Culture, blood (Routine x 2)     Status: Abnormal   Collection Time: 07/07/19  2:25 PM   Specimen: BLOOD LEFT HAND  Result Value Ref Range Status   Specimen Description   Final    BLOOD LEFT HAND Performed at [facility] [address], [place], [place] [address]    Special Requests   Final    BOTTLES DRAWN AEROBIC AND ANAEROBIC Blood Culture results may not be optimal due to an excessive volume of blood received in culture bottles  Performed at [facility] [address] [place], [place] [address]    Culture  Setup Time   Final    GRAM POSITIVE COCCI AEROBIC BOTTLE ONLY CRITICAL RESULT CALLED TO, READ BACK BY AND VERIFIED WITH: Dawson Bills PharmD 15:40 07/08/19 ([email or web address])    Culture (A)  Final    STAPHYLOCOCCUS SPECIES (COAGULASE NEGATIVE) THE SIGNIFICANCE OF ISOLATING THIS ORGANISM FROM A SINGLE SET  OF BLOOD CULTURES WHEN MULTIPLE SETS ARE DRAWN IS UNCERTAIN. PLEASE NOTIFY THE MICROBIOLOGY DEPARTMENT WITHIN ONE WEEK IF SPECIATION AND SENSITIVITIES ARE REQUIRED. Performed at [facility], [place] [address], [place], [place] [address]    Report Status 07/10/2019 FINAL  Final  Blood Culture ID Panel (Reflexed)     Status: Abnormal   Collection Time: 07/07/19  2:25 PM  Result Value Ref Range Status   Enterococcus species NOT DETECTED NOT DETECTED Final   Listeria monocytogenes NOT DETECTED NOT DETECTED Final   Staphylococcus species DETECTED (A) NOT DETECTED Final    Comment: Methicillin (oxacillin) susceptible coagulase negative staphylococcus. Possible blood culture contaminant (unless isolated from more than one blood culture draw or clinical case suggests pathogenicity). No antibiotic treatment is indicated for blood  culture contaminants. CRITICAL RESULT CALLED TO, READ BACK BY AND VERIFIED WITH: Dawson Bills PharmD 15:40 07/08/19 ([email or web address])    Staphylococcus aureus (BCID) NOT DETECTED NOT DETECTED Final   Methicillin resistance NOT DETECTED NOT DETECTED Final   Streptococcus species NOT DETECTED NOT DETECTED Final   Streptococcus agalactiae NOT DETECTED NOT DETECTED Final   Streptococcus pneumoniae NOT DETECTED NOT DETECTED Final   Streptococcus pyogenes NOT DETECTED NOT DETECTED Final   Acinetobacter baumannii NOT DETECTED NOT DETECTED Final   Enterobacteriaceae species NOT DETECTED NOT DETECTED Final   Enterobacter cloacae complex NOT DETECTED NOT DETECTED Final   Escherichia coli NOT  DETECTED NOT DETECTED Final   Klebsiella oxytoca NOT DETECTED NOT DETECTED Final   Klebsiella pneumoniae NOT DETECTED NOT DETECTED Final   Proteus species NOT DETECTED NOT DETECTED Final   Serratia marcescens NOT DETECTED NOT DETECTED Final   Haemophilus influenzae NOT DETECTED NOT DETECTED Final   Neisseria meningitidis NOT DETECTED NOT DETECTED Final   Pseudomonas aeruginosa NOT DETECTED NOT DETECTED Final   Candida albicans NOT DETECTED NOT DETECTED Final   Candida glabrata NOT DETECTED NOT DETECTED Final   Candida krusei NOT DETECTED NOT DETECTED Final   Candida parapsilosis NOT DETECTED NOT DETECTED Final   Candida tropicalis NOT DETECTED NOT DETECTED Final    Comment: Performed at [facility], [address], [place], [place] [address]  Urine culture     Status: Abnormal   Collection Time: 07/07/19  2:31 PM   Specimen: Urine, Random  Result Value Ref Range Status   Specimen Description   Final    URINE, RANDOM Performed at [facility] [address] [place], [place] [address]    Special Requests   Final    NONE Performed at [facility], [place] [address] [place], [place] [address]    Culture (A)  Final    >=100,000 COLONIES/mL ESCHERICHIA COLI 70,000 COLONIES/mL KLEBSIELLA PNEUMONIAE    Report Status 07/10/2019 FINAL  Final   Organism ID, Bacteria ESCHERICHIA COLI (A)  Final   Organism ID, Bacteria KLEBSIELLA PNEUMONIAE (A)  Final      Susceptibility   Escherichia coli - MIC*    AMPICILLIN <=2 SENSITIVE Sensitive     CEFAZOLIN <=4 SENSITIVE Sensitive     CEFTRIAXONE <=1 SENSITIVE Sensitive     CIPROFLOXACIN <=0.25 SENSITIVE Sensitive     GENTAMICIN <=1 SENSITIVE Sensitive     IMIPENEM <=0.25 SENSITIVE Sensitive     NITROFURANTOIN <=16 SENSITIVE Sensitive     TRIMETH/SULFA <=20 SENSITIVE Sensitive     AMPICILLIN/SULBACTAM <=2 SENSITIVE Sensitive     PIP/TAZO <=4 SENSITIVE Sensitive     Extended ESBL NEGATIVE Sensitive      * >=  100,000 COLONIES/mL ESCHERICHIA COLI   Klebsiella pneumoniae - MIC*    AMPICILLIN RESISTANT Resistant     CEFAZOLIN <=4 SENSITIVE Sensitive     CEFTRIAXONE <=1 SENSITIVE Sensitive     CIPROFLOXACIN <=0.25 SENSITIVE Sensitive     GENTAMICIN <=1 SENSITIVE Sensitive     IMIPENEM <=0.25 SENSITIVE Sensitive     NITROFURANTOIN <=16 SENSITIVE Sensitive     TRIMETH/SULFA <=20 SENSITIVE Sensitive     AMPICILLIN/SULBACTAM <=2 SENSITIVE Sensitive     PIP/TAZO <=4 SENSITIVE Sensitive     Extended ESBL NEGATIVE Sensitive     * 70,000 COLONIES/mL KLEBSIELLA PNEUMONIAE  SARS Coronavirus 2 [facility] order, Performed in [facility] hospital lab) Nasopharyngeal Nasopharyngeal Swab     Status: None   Collection Time: 07/07/19  2:31 PM   Specimen: Nasopharyngeal Swab  Result Value Ref Range Status   SARS Coronavirus 2 NEGATIVE NEGATIVE Final    Comment: (NOTE) If result is NEGATIVE SARS-CoV-2 target nucleic acids are NOT DETECTED. The SARS-CoV-2 RNA is generally detectable in upper and lower  respiratory specimens during the acute phase of infection. The lowest  concentration of SARS-CoV-2 viral copies this assay can detect is 250  copies / mL. A negative result does not preclude SARS-CoV-2 infection  and should not be used as the sole basis for treatment or other  patient management decisions.  A negative result may occur with  improper specimen collection / handling, submission of specimen other  than nasopharyngeal swab, presence of viral mutation(s) within the  areas targeted by this assay, and inadequate number of viral copies  (<250 copies / mL). A negative result must be combined with clinical  observations, patient history, and epidemiological information. If result is POSITIVE SARS-CoV-2 target nucleic acids are DETECTED. The SARS-CoV-2 RNA is generally detectable in upper and lower  respiratory specimens dur ing the acute phase of infection.  Positive  results are indicative of  active infection with SARS-CoV-2.  Clinical  correlation with patient history and other diagnostic information is  necessary to determine patient infection status.  Positive results do  not rule out bacterial infection or co-infection with other viruses. If result is PRESUMPTIVE POSTIVE SARS-CoV-2 nucleic acids MAY BE PRESENT.   A presumptive positive result was obtained on the submitted specimen  and confirmed on repeat testing.  While 2019 novel coronavirus  (SARS-CoV-2) nucleic acids may be present in the submitted sample  additional confirmatory testing may be necessary for epidemiological  and / or clinical management purposes  to differentiate between  SARS-CoV-2 and other Sarbecovirus currently known to infect humans.  If clinically indicated additional testing with an alternate test  methodology [phone]) is advised. The SARS-CoV-2 RNA is generally  detectable in upper and lower respiratory sp ecimens during the acute  phase of infection. The expected result is Negative. Fact Sheet for Patients:  StrictlyIdeas.no Fact Sheet for Healthcare Providers: BankingDealers.co.za This test is not yet approved or cleared by the [place] FDA and has been authorized for detection and/or diagnosis of SARS-CoV-2 by FDA under an Emergency Use Authorization (EUA).  This EUA will remain in effect (meaning this test can be used) for the duration of the COVID-19 declaration under Section 564(b)(1) of the Act, 21 U.S.C. section 360bbb-3(b)(1), unless the authorization is terminated or revoked sooner. Performed at [facility], [place] [address] [place], [place] [address]   MRSA PCR Screening     Status: None   Collection Time: 07/08/19  5:40 AM   Specimen: Nasal  Mucosa; Nasopharyngeal  Result Value Ref Range Status   MRSA by PCR NEGATIVE NEGATIVE Final    Comment:        The GeneXpert MRSA Assay (FDA approved for NASAL  specimens only), is one component of a comprehensive MRSA colonization surveillance program. It is not intended to diagnose MRSA infection nor to guide or monitor treatment for MRSA infections. Performed at [facility], [place] [address], [place], [place] [address]   Culture, blood (Routine X 2) w Reflex to ID Panel     Status: None (Preliminary result)   Collection Time: 07/09/19  7:55 AM   Specimen: BLOOD LEFT HAND  Result Value Ref Range Status   Specimen Description BLOOD LEFT HAND  Final   Special Requests   Final    BOTTLES DRAWN AEROBIC ONLY Blood Culture adequate volume   Culture   Final    NO GROWTH 4 DAYS Performed at [facility], [place] [address] [place], [place] [address]    Report Status PENDING  Incomplete  Culture, blood (Routine X 2) w Reflex to ID Panel     Status: None (Preliminary result)   Collection Time: 07/09/19  7:56 AM   Specimen: BLOOD LEFT HAND  Result Value Ref Range Status   Specimen Description BLOOD LEFT HAND  Final   Special Requests   Final    BOTTLES DRAWN AEROBIC ONLY Blood Culture results may not be optimal due to an inadequate volume of blood received in culture bottles   Culture   Final    NO GROWTH 4 DAYS Performed at [facility], [place] [address], [place], [place] [address]    Report Status PENDING  Incomplete      Radiology Studies: No results found.      Scheduled Meds: . cephALEXin  500 mg Oral Q12H  . insulin aspart  0-9 Units Subcutaneous TID WC  . pantoprazole  40 mg Oral BID  . sodium chloride flush  3 mL Intravenous Once   Continuous Infusions: . lactated ringers 100 mL/hr at 07/12/19 1750    Assessment & Plan:    1.  Acute renal failure with hyperkalemia/metabolic acidosis: Present on admission.  Likely secondary to ATN or prerenal in the setting of ACE inhibitor/diuretics/NSAID use.  Creatinine level improving after holding medications and with IV hydration (6.6-->5.28-->3.2-->2.0-->1.6). Will  discontinue IV fluids at this point and monitor on encouraged oral fluid intake. Continue to hold ACE inhibitor/diuretic/metformin.  Nephrology was following during the initial hospital course but they have since signed off.  2.  Elevated CK: Secondary to recent fall/injury versus statin induced.  Crestor on hold.  CK peaked to 2774 and  down to 428 with IV hydration  3.  Anemia: Last hemoglobin from 2016 at 12.  Has been stable around 7.2 while here.  Status post 1 unit PRBC in the ED.  No further black stools noted.  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 Patient without bleeding or GI sx today and understands the need to avoid all NSAIDs.  Advanced diet as tolerated.  She is tolerating soft diet well (does not have dentures).  PPI and f/u Dr Collene Mares in 2 weeks  4.  Hypertension: Home medications on hold due to problem #1.  She has been normotensive here in spite of IV hydration but now SBP appears to be uptrending.  Will closely monitor off IV fluids today and can prescribe  Norvasc low-dose if blood pressure still high in a.m.  5.  Diabetes mellitus: Sliding scale insulin.  Metformin held due to problem #1 and metabolic acidosis.  Hemoglobin A1c at 5.5.  Can likely control with diet  6.  UTI: Urine culture grew E. coli.  Ceftriaxone changed to cephalexin/ completes course today( 8/9)  7.  Electrolyte imbalance: Patient initially was hyperkalemic in the setting of AKI, then received replacement for hypokalemia.  Continue to monitor and treat as needed  8.  Recent mechanical fall,wrist fracture: Has splint in place.  Seen by PT.  Follow-up orthopedics as outpatient.  Avoid NSAIDs  DVT prophylaxis: SCDs given problem #3 Code Status: Full code Family / Patient Communication: Discussed with patient and all questions answered.d/w GI Disposition Plan: Seen by PT and recommended skilled nursing facility given two-person assist  with care of right upper extremity.  Case management aware and looking into options [facility])     LOS: 6 days    Time spent: 35 minutes    Guilford Shi, MD [facility] Pager [phone]  If 7PM-7AM, please contact night-coverage [email or web address] Password [email or web address] 07/13/2019, 4:55 PM

## 2019-07-13 NOTE — Progress Notes (Signed)
The patient is receiving Protonix by the intravenous route.  Based on criteria approved by the Pharmacy and [place], the medication is being converted to the equivalent oral dose form.  These criteria include: -No active GI bleeding -Able to tolerate diet of full liquids (or better) or tube feeding -Able to tolerate other medications by the oral or enteral route  If you have any questions about this conversion, please contact the Pharmacy Department (phone [phone]).    Thank you. Nicole Cella, RPh Clinical Pharmacist [phone] Please check AMION for all [facility] phone numbers After 10:00 PM, call [place] [phone] 07/13/2019 8:27 AM

## 2019-07-14 LAB — RENAL FUNCTION PANEL
Albumin: 2.2 g/dL — ABNORMAL LOW (ref 3.5–5.0)
Anion gap: 8 (ref 5–15)
BUN: 17 mg/dL (ref 8–23)
CO2: 22 mmol/L (ref 22–32)
Calcium: 8.2 mg/dL — ABNORMAL LOW (ref 8.9–10.3)
Chloride: 105 mmol/L (ref 98–111)
Creatinine, Ser: 1.5 mg/dL — ABNORMAL HIGH (ref 0.44–1.00)
GFR calc Af Amer: 39 mL/min — ABNORMAL LOW (ref >60)
GFR calc non Af Amer: 33 mL/min — ABNORMAL LOW (ref >60)
Glucose, Bld: 149 mg/dL — ABNORMAL HIGH (ref 70–99)
Phosphorus: 3.2 mg/dL (ref 2.5–4.6)
Potassium: 4.2 mmol/L (ref 3.5–5.1)
Sodium: 135 mmol/L (ref 135–145)

## 2019-07-14 LAB — CBC WITH DIFFERENTIAL/PLATELET
Abs Immature Granulocytes: 0.49 10*3/uL — ABNORMAL HIGH (ref 0.00–0.07)
Basophils Absolute: 0 10*3/uL (ref 0.0–0.1)
Basophils Relative: 0 %
Eosinophils Absolute: 0.2 10*3/uL (ref 0.0–0.5)
Eosinophils Relative: 2 %
HCT: 25.4 % — ABNORMAL LOW (ref 36.0–46.0)
Hemoglobin: 8.4 g/dL — ABNORMAL LOW (ref 12.0–15.0)
Immature Granulocytes: 5 %
Lymphocytes Relative: 16 %
Lymphs Abs: 1.5 10*3/uL (ref 0.7–4.0)
MCH: 29.9 pg (ref 26.0–34.0)
MCHC: 33.1 g/dL (ref 30.0–36.0)
MCV: 90.4 fL (ref 80.0–100.0)
Monocytes Absolute: 0.7 10*3/uL (ref 0.1–1.0)
Monocytes Relative: 8 %
Neutro Abs: 6.6 10*3/uL (ref 1.7–7.7)
Neutrophils Relative %: 69 %
Platelets: 172 10*3/uL (ref 150–400)
RBC: 2.81 MIL/uL — ABNORMAL LOW (ref 3.87–5.11)
RDW: 13.1 % (ref 11.5–15.5)
WBC: 9.5 10*3/uL (ref 4.0–10.5)
nRBC: 0 % (ref 0.0–0.2)

## 2019-07-14 LAB — CULTURE, BLOOD (ROUTINE X 2)
Culture: NO GROWTH
Culture: NO GROWTH
Special Requests: ADEQUATE

## 2019-07-14 LAB — CK: Total CK: 45 U/L (ref 38–234)

## 2019-07-14 LAB — GLUCOSE, CAPILLARY
Glucose-Capillary: 123 mg/dL — ABNORMAL HIGH (ref 70–99)
Glucose-Capillary: 107 mg/dL — ABNORMAL HIGH (ref 70–99)
Glucose-Capillary: 117 mg/dL — ABNORMAL HIGH (ref 70–99)
Glucose-Capillary: 137 mg/dL — ABNORMAL HIGH (ref 70–99)

## 2019-07-14 MED ORDER — AMLODIPINE BESYLATE 5 MG PO TABS
5.0000 mg | ORAL_TABLET | Freq: Every day | ORAL | Status: DC
  Administered 2019-07-15 – 2019-07-16 (×2): 5 mg via ORAL
  Filled 2019-07-14 (×2): qty 1

## 2019-07-14 NOTE — Progress Notes (Signed)
PROGRESS NOTE    Beth Garrison  VQQ:[id number] DOB: [birth year] DOA: 07/07/2019 PCP: System, Pcp Not In      Brief Narrative:  Mrs. Beth Garrison is a 78 y.o. F with DM, HTN, and CKD III baseline Cr 1.2 who presented with weakness, SOB progressive over weeks.  In the ER, found to have creatinine 6.6, K 7.5.    Also in the ER, patient found to have wrist fracture right arm by x-ray (after a fall PTA due to progressive weakness).  She also was noted to have dark stool and anemia.     Assessment & Plan:  Acute renal failure on CKD stage III In setting of NSAID use, cimetidine + metformin/lisinopril/HCTZ use.  ANCAs, SPEP, ANA all normal.  UOP good.  Creatinine resolving.   -Repeat BMP in 1 week -Hold lisinopril for now    Acute metabolic encephalopathy Resolved  Hypertension BP trending up to 140s -Hold lisinopril, HCTZ  Rhabdomyolysis Resolved -Resume Crestor at D/c  Hyperkalemia Resolved -Daily BMP  Diabetes Glucose normal -Hold metformin -Continue SSI corrections -Hold gabapentin   Urinary tract infection Urine culture showed pan susceptible e coli.  Treated with Cephalexin.    Anemia of iron deficiency Peptic ulcer disease Baseline Hgb normal in 2017, unknown since.  Presented with anemia, SOB.  Transfused 1 unit in ER.     Hgb now stable. Underwnt EGD during this hospitalization, showed esophagitis, gastric and duodenal ulcers consistent with NSAID use. -Continue PPI -Avoid cimetidine at discharge   Wrist fracture -Follow up with Orthopedics after discharge      MDM and disposition: The below labs and imaging reports were reviewed and summarized above.  Medication management as above.  This is a severe acute illness with threat to life.  She still has severe renal insufficiency, resolving encephalopathy.  The patient was admitted with AKI, hyperkalemia, encephalopathy, anemia and melena.    Her AKI is resolving.  She is significantly debilitated from  baseline (totally independent at baseline).  Will need skilled placement for rehab.  SW pending and repeat COVID testing pending.     DVT prophylaxis: SCDs Code Status: FULL Family Communication:     Consultants:   GI  Nephrology  Procedures:   EGD 8/6 Findings:      LA Grade D (one or more mucosal breaks involving at least 75% of       esophageal circumference) esophagitis with no bleeding was found in the       mid and lower esophagus.      Two non-bleeding cratered gastric ulcers with no stigmata of bleeding       were found in the gastric antrum. The largest lesion was 3 mm in largest       dimension. Biopsies were taken with a cold forceps for histology.      One non-bleeding superficial duodenal ulcer with no stigmata of bleeding       was found in the duodenal bulb. The lesion was 7 mm in largest dimension.      There was evidence of small superficial ulcerations in the antrum as       well as a larger superficial ulcer in the duodenal bulb. These ulcers       were clean-based. The current findings are consistent with NSAID-induced       ulcerations. Impression:               - LA Grade D reflux esophagitis.                           -  Non-bleeding gastric ulcers with no stigmata of                            bleeding. Biopsied.                           - Non-bleeding duodenal ulcer with no stigmata of                            bleeding. Recommendation:           - Return patient to hospital ward for ongoing care.                           - Resume regular diet.                           - Continue present medications.                           - PPI QD.                           - Avoid NSAIDs.                           - Follow up with Dr. Collene Garrison in two weeks.   Central line placement 8/3  Renal US 8/3 FINDINGS: Right Kidney:  Renal measurements: 10.3 x 4.4 x 5.8 cm = volume: 139.2 mL . Echogenicity within normal limits. No mass or hydronephrosis  visualized.  Left Kidney:  Renal measurements: 8.2 x 3.4 x 4 cm = volume: 59.5 mL. Cortical echogenicity within normal limits. No hydronephrosis. Probable mild thinning of the cortex.  Bladder:  Appears normal for degree of bladder distention.  IMPRESSION: 1. Negative for hydronephrosis. 2. Slight asymmetric renal size left smaller than right with possible mild renal cortical thinning/atrophy on the left       Subjective: Feeling tired.  Weak.  No vomiting.  No melena today.  No cough, fever.  Still somewhat confused, inattentive.    Objective: Vitals:   07/13/19 2135 07/14/19 0548 07/14/19 0551 07/14/19 1150  BP: (!) 149/61 (!) 142/59  (!) 144/58  Pulse: 79 80  70  Resp: 16 16  18   Temp: 98.7 F (37.1 C) 98.5 F (36.9 C)  98.5 F (36.9 C)  TempSrc: Oral Oral  Oral  SpO2: 98% 97%  99%  Weight:   83.2 kg   Height:        Intake/Output Summary (Last 24 hours) at 07/14/2019 1815 Last data filed at 07/14/2019 1658 Gross per 24 hour  Intake 240 ml  Output 2500 ml  Net -2260 ml   Filed Weights   07/10/19 1350 07/12/19 0645 07/14/19 0551  Weight: 80 kg 86.3 kg 83.2 kg    Examination: General appearance: Adult female, lying in bed, no acute distress HEENT: Anicteric, conjunctival pink, lids and lashes normal.  Nasal deformity, discharge, or epistaxis.  Lips moist, oropharynx tacky dry, no oral lesions, hearing normal.   Skin: Skin warm and dry, no suspicious rashes or lesions.. Cardiac: Heart rate regular, no murmurs, no lower extremity edema, JVP normal Respiratory: Normal respiratory rate and rhythm, lungs clear without rales  or wheezes Abdomen: Abdomen soft without tenderness to palpation or guarding, no ascites or distention.   MSK: No deformities or effusions. Neuro: Awake and alert, extraocular movements intact, moves all extremities with weak strength and coordination, speech fluent Psych: Sensorium intact and responding to questions, attention normal,  affect blunted, judgment and insight appear normal.      Data Reviewed: I have personally reviewed following labs and imaging studies:  CBC: Recent Labs  Lab 07/10/19 0239 07/11/19 0454 07/12/19 0243 07/13/19 0552 07/14/19 0347  WBC 9.0 7.7 8.5 8.1 9.5  NEUTROABS 6.3 4.9 6.0 4.9 6.6  HGB 7.4* 7.5* 7.5* 7.9* 8.4*  HCT 22.9* 23.1* 23.2* 24.3* 25.4*  MCV 90.5 91.7 90.3 91.0 90.4  PLT 167 161 165 161 638   Basic Metabolic Panel: Recent Labs  Lab 07/10/19 0239 07/11/19 0454 07/12/19 0243 07/13/19 0552 07/14/19 0347  NA 140 139 136 135 135  K 3.2* 3.1* 3.7 4.1 4.2  CL 105 106 105 106 105  CO2 25 22 19* 20* 22  GLUCOSE 143* 119* 106* 112* 149*  BUN 45* 34* 26* 21 17  CREATININE 2.50* 2.10* 2.02* 1.63* 1.50*  CALCIUM 7.2* 7.5* 7.8* 8.2* 8.2*  PHOS 3.8 3.4 2.5 2.8 3.2   GFR: Estimated Creatinine Clearance: 34 mL/min (A) (by C-G formula based on SCr of 1.5 mg/dL (H)). Liver Function Tests: Recent Labs  Lab 07/08/19 0610  07/10/19 0239 07/11/19 0454 07/12/19 0243 07/13/19 0552 07/14/19 0347  AST 37  --   --   --   --   --   --   ALT 20  --   --   --   --   --   --   ALKPHOS 97  --   --   --   --   --   --   BILITOT 0.6  --   --   --   --   --   --   PROT 5.4*  --   --   --   --   --   --   ALBUMIN 2.7*  2.8*   < > 2.1* 2.0* 2.1* 2.2* 2.2*   < > = values in this interval not displayed.   No results for input(s): LIPASE, AMYLASE in the last 168 hours. No results for input(s): AMMONIA in the last 168 hours. Coagulation Profile: No results for input(s): INR, PROTIME in the last 168 hours. Cardiac Enzymes: Recent Labs  Lab 07/10/19 0239 07/11/19 0454 07/12/19 0243 07/13/19 0552 07/14/19 0347  CKTOTAL 428* 202 105 56 45   BNP (last 3 results) No results for input(s): PROBNP in the last 8760 hours. HbA1C: No results for input(s): HGBA1C in the last 72 hours. CBG: Recent Labs  Lab 07/13/19 1759 07/13/19 2136 07/14/19 0749 07/14/19 1149 07/14/19 1657   GLUCAP 112* 138* 123* 107* 117*   Lipid Profile: No results for input(s): CHOL, HDL, LDLCALC, TRIG, CHOLHDL, LDLDIRECT in the last 72 hours. Thyroid Function Tests: No results for input(s): TSH, T4TOTAL, FREET4, T3FREE, THYROIDAB in the last 72 hours. Anemia Panel: No results for input(s): VITAMINB12, FOLATE, FERRITIN, TIBC, IRON, RETICCTPCT in the last 72 hours. Urine analysis:    Component Value Date/Time   COLORURINE YELLOW 07/07/2019 1425   APPEARANCEUR CLOUDY (A) 07/07/2019 1425   LABSPEC 1.012 07/07/2019 1425   PHURINE 7.0 07/07/2019 1425   GLUCOSEU NEGATIVE 07/07/2019 1425   GLUCOSEU NEGATIVE 12/31/2014 1145   HGBUR MODERATE (A) 07/07/2019 1425   [place] 07/07/2019  [place] 07/07/2019 1425   PROTEINUR 100 (A) 07/07/2019 1425   UROBILINOGEN 0.2 12/31/2014 1145   NITRITE NEGATIVE 07/07/2019 1425   LEUKOCYTESUR MODERATE (A) 07/07/2019 1425   Sepsis Labs: @LABRCNTIP (procalcitonin:4,lacticacidven:4)  ) Recent Results (from the past 240 hour(s))  Culture, blood (Routine x 2)     Status: None   Collection Time: 07/07/19  2:25 PM   Specimen: BLOOD  Result Value Ref Range Status   Specimen Description   Final    BLOOD LEFT ANTECUBITAL Performed at [facility], [place] [address] [place], [place] [address]    Special Requests   Final    BOTTLES DRAWN AEROBIC AND ANAEROBIC Blood Culture results may not be optimal due to an excessive volume of blood received in culture bottles Performed at [facility] [address], [place], [place] [address]    Culture   Final    NO GROWTH 5 DAYS Performed at [facility], [place] [address] [place], [place] [address]    Report Status 07/12/2019 FINAL  Final  Culture, blood (Routine x 2)     Status: Abnormal   Collection Time: 07/07/19  2:25 PM   Specimen: BLOOD LEFT HAND  Result Value Ref Range Status   Specimen Description   Final    BLOOD LEFT HAND Performed  at [facility] [address] [place], [place] [address]    Special Requests   Final    BOTTLES DRAWN AEROBIC AND ANAEROBIC Blood Culture results may not be optimal due to an excessive volume of blood received in culture bottles Performed at [facility] [address] [place], [place] [address]    Culture  Setup Time   Final    GRAM POSITIVE COCCI AEROBIC BOTTLE ONLY CRITICAL RESULT CALLED TO, READ BACK BY AND VERIFIED WITH: Dawson Bills PharmD 15:40 07/08/19 ([email or web address])    Culture (A)  Final    STAPHYLOCOCCUS SPECIES (COAGULASE NEGATIVE) THE SIGNIFICANCE OF ISOLATING THIS ORGANISM FROM A SINGLE SET OF BLOOD CULTURES WHEN MULTIPLE SETS ARE DRAWN IS UNCERTAIN. PLEASE NOTIFY THE MICROBIOLOGY DEPARTMENT WITHIN ONE WEEK IF SPECIATION AND SENSITIVITIES ARE REQUIRED. Performed at [facility], [place] [address], [place], [place] [address]    Report Status 07/10/2019 FINAL  Final  Blood Culture ID Panel (Reflexed)     Status: Abnormal   Collection Time: 07/07/19  2:25 PM  Result Value Ref Range Status   Enterococcus species NOT DETECTED NOT DETECTED Final   Listeria monocytogenes NOT DETECTED NOT DETECTED Final   Staphylococcus species DETECTED (A) NOT DETECTED Final    Comment: Methicillin (oxacillin) susceptible coagulase negative staphylococcus. Possible blood culture contaminant (unless isolated from more than one blood culture draw or clinical case suggests pathogenicity). No antibiotic treatment is indicated for blood  culture contaminants. CRITICAL RESULT CALLED TO, READ BACK BY AND VERIFIED WITH: Dawson Bills PharmD 15:40 07/08/19 ([email or web address])    Staphylococcus aureus (BCID) NOT DETECTED NOT DETECTED Final   Methicillin resistance NOT DETECTED NOT DETECTED Final   Streptococcus species NOT DETECTED NOT DETECTED Final   Streptococcus agalactiae NOT DETECTED NOT DETECTED Final   Streptococcus pneumoniae NOT DETECTED NOT DETECTED Final    Streptococcus pyogenes NOT DETECTED NOT DETECTED Final   Acinetobacter baumannii NOT DETECTED NOT DETECTED Final   Enterobacteriaceae species NOT DETECTED NOT DETECTED Final   Enterobacter cloacae complex NOT DETECTED NOT DETECTED Final   Escherichia coli NOT DETECTED NOT DETECTED Final  Klebsiella oxytoca NOT DETECTED NOT DETECTED Final   Klebsiella pneumoniae NOT DETECTED NOT DETECTED Final   Proteus species NOT DETECTED NOT DETECTED Final   Serratia marcescens NOT DETECTED NOT DETECTED Final   Haemophilus influenzae NOT DETECTED NOT DETECTED Final   Neisseria meningitidis NOT DETECTED NOT DETECTED Final   Pseudomonas aeruginosa NOT DETECTED NOT DETECTED Final   Candida albicans NOT DETECTED NOT DETECTED Final   Candida glabrata NOT DETECTED NOT DETECTED Final   Candida krusei NOT DETECTED NOT DETECTED Final   Candida parapsilosis NOT DETECTED NOT DETECTED Final   Candida tropicalis NOT DETECTED NOT DETECTED Final    Comment: Performed at [facility], [place] [address], [place], [place] [address]  Urine culture     Status: Abnormal   Collection Time: 07/07/19  2:31 PM   Specimen: Urine, Random  Result Value Ref Range Status   Specimen Description   Final    URINE, RANDOM Performed at [facility] [address], [place], [place] [address]    Special Requests   Final    NONE Performed at [facility], [place] [address] [place], [place] [address]    Culture (A)  Final    >=100,000 COLONIES/mL ESCHERICHIA COLI 70,000 COLONIES/mL KLEBSIELLA PNEUMONIAE    Report Status 07/10/2019 FINAL  Final   Organism ID, Bacteria ESCHERICHIA COLI (A)  Final   Organism ID, Bacteria KLEBSIELLA PNEUMONIAE (A)  Final      Susceptibility   Escherichia coli - MIC*    AMPICILLIN <=2 SENSITIVE Sensitive     CEFAZOLIN <=4 SENSITIVE Sensitive     CEFTRIAXONE <=1 SENSITIVE Sensitive     CIPROFLOXACIN <=0.25 SENSITIVE Sensitive     GENTAMICIN <=1 SENSITIVE  Sensitive     IMIPENEM <=0.25 SENSITIVE Sensitive     NITROFURANTOIN <=16 SENSITIVE Sensitive     TRIMETH/SULFA <=20 SENSITIVE Sensitive     AMPICILLIN/SULBACTAM <=2 SENSITIVE Sensitive     PIP/TAZO <=4 SENSITIVE Sensitive     Extended ESBL NEGATIVE Sensitive     * >=100,000 COLONIES/mL ESCHERICHIA COLI   Klebsiella pneumoniae - MIC*    AMPICILLIN RESISTANT Resistant     CEFAZOLIN <=4 SENSITIVE Sensitive     CEFTRIAXONE <=1 SENSITIVE Sensitive     CIPROFLOXACIN <=0.25 SENSITIVE Sensitive     GENTAMICIN <=1 SENSITIVE Sensitive     IMIPENEM <=0.25 SENSITIVE Sensitive     NITROFURANTOIN <=16 SENSITIVE Sensitive     TRIMETH/SULFA <=20 SENSITIVE Sensitive     AMPICILLIN/SULBACTAM <=2 SENSITIVE Sensitive     PIP/TAZO <=4 SENSITIVE Sensitive     Extended ESBL NEGATIVE Sensitive     * 70,000 COLONIES/mL KLEBSIELLA PNEUMONIAE  SARS Coronavirus 2 [facility] order, Performed in [place] hospital lab) Nasopharyngeal Nasopharyngeal Swab     Status: None   Collection Time: 07/07/19  2:31 PM   Specimen: Nasopharyngeal Swab  Result Value Ref Range Status   SARS Coronavirus 2 NEGATIVE NEGATIVE Final    Comment: (NOTE) If result is NEGATIVE SARS-CoV-2 target nucleic acids are NOT DETECTED. The SARS-CoV-2 RNA is generally detectable in upper and lower  respiratory specimens during the acute phase of infection. The lowest  concentration of SARS-CoV-2 viral copies this assay can detect is 250  copies / mL. A negative result does not preclude SARS-CoV-2 infection  and should not be used as the sole basis for treatment or other  patient management decisions.  A negative result may occur with  improper specimen collection / handling, submission of  specimen other  than nasopharyngeal swab, presence of viral mutation(s) within the  areas targeted by this assay, and inadequate number of viral copies  (<250 copies / mL). A negative result must be combined with clinical  observations, patient history,  and epidemiological information. If result is POSITIVE SARS-CoV-2 target nucleic acids are DETECTED. The SARS-CoV-2 RNA is generally detectable in upper and lower  respiratory specimens dur ing the acute phase of infection.  Positive  results are indicative of active infection with SARS-CoV-2.  Clinical  correlation with patient history and other diagnostic information is  necessary to determine patient infection status.  Positive results do  not rule out bacterial infection or co-infection with other viruses. If result is PRESUMPTIVE POSTIVE SARS-CoV-2 nucleic acids MAY BE PRESENT.   A presumptive positive result was obtained on the submitted specimen  and confirmed on repeat testing.  While 2019 novel coronavirus  (SARS-CoV-2) nucleic acids may be present in the submitted sample  additional confirmatory testing may be necessary for epidemiological  and / or clinical management purposes  to differentiate between  SARS-CoV-2 and other Sarbecovirus currently known to infect humans.  If clinically indicated additional testing with an alternate test  methodology [id number]) is advised. The SARS-CoV-2 RNA is generally  detectable in upper and lower respiratory sp ecimens during the acute  phase of infection. The expected result is Negative. Fact Sheet for Patients:  StrictlyIdeas.no Fact Sheet for Healthcare Providers: BankingDealers.co.za This test is not yet approved or cleared by the [place] FDA and has been authorized for detection and/or diagnosis of SARS-CoV-2 by FDA under an Emergency Use Authorization (EUA).  This EUA will remain in effect (meaning this test can be used) for the duration of the COVID-19 declaration under Section 564(b)(1) of the Act, 21 U.S.C. section 360bbb-3(b)(1), unless the authorization is terminated or revoked sooner. Performed at [facility], [place] [address], [place], [place] [address]    MRSA PCR Screening     Status: None   Collection Time: 07/08/19  5:40 AM   Specimen: Nasal Mucosa; Nasopharyngeal  Result Value Ref Range Status   MRSA by PCR NEGATIVE NEGATIVE Final    Comment:        The GeneXpert MRSA Assay (FDA approved for NASAL specimens only), is one component of a comprehensive MRSA colonization surveillance program. It is not intended to diagnose MRSA infection nor to guide or monitor treatment for MRSA infections. Performed at [facility], [place] [address], [place], [place] [address]   Culture, blood (Routine X 2) w Reflex to ID Panel     Status: None   Collection Time: 07/09/19  7:55 AM   Specimen: BLOOD LEFT HAND  Result Value Ref Range Status   Specimen Description BLOOD LEFT HAND  Final   Special Requests   Final    BOTTLES DRAWN AEROBIC ONLY Blood Culture adequate volume   Culture   Final    NO GROWTH 5 DAYS Performed at [facility], [place] [address] [place], [place] [address]    Report Status 07/14/2019 FINAL  Final  Culture, blood (Routine X 2) w Reflex to ID Panel     Status: None   Collection Time: 07/09/19  7:56 AM   Specimen: BLOOD LEFT HAND  Result Value Ref Range Status   Specimen Description BLOOD LEFT HAND  Final   Special Requests   Final    BOTTLES DRAWN AEROBIC ONLY Blood Culture results may not be optimal due to  an inadequate volume of blood received in culture bottles   Culture   Final    NO GROWTH 5 DAYS Performed at [facility], [place] [address], [place], [place] [address]    Report Status 07/14/2019 FINAL  Final         Radiology Studies: No results found.      Scheduled Meds: . insulin aspart  0-9 Units Subcutaneous TID WC  . pantoprazole  40 mg Oral BID  . sodium chloride flush  3 mL Intravenous Once   Continuous Infusions:    LOS: 7 days    Time spent: 25 minutes    Edwin Dada, MD [facility] 07/14/2019, 6:15 PM     Please page through [place]:   [email or web address] Password [email or web address] If 7PM-7AM, please contact night-coverage

## 2019-07-14 NOTE — Progress Notes (Signed)
Physical Therapy Treatment Patient Details Name: Beth Garrison MRN: [id number] DOB: [birth year] Today's Date: 07/14/2019    History of Present Illness 78 yo female with mult recent falls is admitted with R radial and ulnar fracture, placed in splint.  Hypoxic and placed on 2L O2.  Has acute encephalopathy, renal failure and anemia with hgb currently 7.4.  Central line placed, referred to PT for rehab assess.   PMHx:  atherosclerosis, AKI, DM, HTN    PT Comments    Pt was up to walk on Eva walker with B UE platforms, leaned forward and PT adjusted for her height.  With the 4 wheel structure could sidestep with min guard to min assist from PT and then became anxious about trying to walk around the bed.  Asked PT to wait and declined OOB to chair.  Talked with pt about being up in chair today, esp as her plan is to go to SNF.  She agreed to think about this.  Follow acutely for progressed gait and balance skills as pt is now taking steps and is able to do more.   Follow Up Recommendations  SNF     Equipment Recommendations  None recommended by PT    Recommendations for Other Services       Precautions / Restrictions Precautions Precautions: Fall Precaution Comments: NWB on R wrist(permitted to WB on R elbow for plaform walker use) Required Braces or Orthoses: Splint/Cast Splint/Cast: R wrist, NWB presumed Restrictions Weight Bearing Restrictions: Yes    Mobility  Bed Mobility Overal bed mobility: Needs Assistance Bed Mobility: Supine to Sit;Sit to Supine     Supine to sit: Min assist(on trunk) Sit to supine: Mod assist(to assist repositioning)      Transfers Overall transfer level: Needs assistance Equipment used: 1 person hand held assist;Bilateral platform walker(eva walker) Transfers: Sit to/from Stand Sit to Stand: Min assist;From elevated surface            Ambulation/Gait Ambulation/Gait assistance: Min assist Gait Distance (Feet): 4 Feet Assistive device:  (eva walker) Gait Pattern/deviations: Step-to pattern;Decreased stride length;Wide base of support Gait velocity: reduced Gait velocity interpretation: <1.31 ft/sec, indicative of household ambulator General Gait Details: pt used 4 wheeled eva walker and was able to sidestep side of bed   Stairs             Wheelchair Mobility    Modified Rankin (Stroke Patients Only)       Balance Overall balance assessment: History of Falls;Needs assistance Sitting-balance support: Feet supported;Single extremity supported Sitting balance-Leahy Scale: Fair     Standing balance support: Bilateral upper extremity supported;During functional activity Standing balance-Leahy Scale: Poor Standing balance comment: good control of balance without PT using [place] walker                            Cognition Arousal/Alertness: Awake/alert Behavior During Therapy: Flat affect Overall Cognitive Status: Within Functional Limits for tasks assessed                                 General Comments: nausea at end of session but not vomiting      Exercises      General Comments General comments (skin integrity, edema, etc.): pt was mostly able to demonstrate care with RUE to stand and control sitting      Pertinent Vitals/Pain Pain Assessment: No/denies pain    Home  Living                      Prior Function            PT Goals (current goals can now be found in the care plan section) Acute Rehab PT Goals Patient Stated Goal: to be able to walk and feel better Progress towards PT goals: Progressing toward goals    Frequency    Min 2X/week      PT Plan Current plan remains appropriate    Co-evaluation              AM-PAC PT "6 Clicks" Mobility   Outcome Measure  Help needed turning from your back to your side while in a flat bed without using bedrails?: A Little Help needed moving from lying on your back to sitting on the side of a flat  bed without using bedrails?: A Little Help needed moving to and from a bed to a chair (including a wheelchair)?: A Lot Help needed standing up from a chair using your arms (e.g., wheelchair or bedside chair)?: A Little Help needed to walk in hospital room?: A Little Help needed climbing 3-5 steps with a railing? : Total 6 Click Score: 15    End of Session Equipment Utilized During Treatment: Gait belt;Other (comment)(RUE splint) Activity Tolerance: Patient limited by fatigue;Treatment limited secondary to medical complications (Comment)(fear she might get more nauseated) Patient left: in bed;with call bell/phone within reach;with bed alarm set;with nursing/sitter in room(sitter in to replace purwick) Nurse Communication: Mobility status PT Visit Diagnosis: Muscle weakness (generalized) (M62.81);History of falling (Z91.81);Difficulty in walking, not elsewhere classified (R26.2);Pain Pain - Right/Left: Right Pain - part of body: Hand     Time: 1135-1204 PT Time Calculation (min) (ACUTE ONLY): 29 min  Charges:  $Gait Training: 8-22 mins $Therapeutic Activity: 8-22 mins                 Ramond Dial 07/14/2019, 12:25 PM   Mee Hives, PT MS Acute Rehab Dept. Number: [place] [facility] [place]

## 2019-07-14 NOTE — Progress Notes (Signed)
Pt resting in bed, ate breakfast, no needs voiced. COVID swab done per order, educated pt on need and procedure. Pt tolerated well.

## 2019-07-14 NOTE — Care Management Important Message (Signed)
Important Message  Patient Details  Name: Beth Garrison MRN: [id number] Date of Birth: 1941-05-05   Medicare Important Message Given:  Yes     Orbie Pyo 07/14/2019, 4:29 PM

## 2019-07-15 DIAGNOSIS — D62 Acute posthemorrhagic anemia: Secondary | ICD-10-CM

## 2019-07-15 DIAGNOSIS — N170 Acute kidney failure with tubular necrosis: Principal | ICD-10-CM

## 2019-07-15 LAB — CBC
HCT: 23.8 % — ABNORMAL LOW (ref 36.0–46.0)
Hemoglobin: 7.8 g/dL — ABNORMAL LOW (ref 12.0–15.0)
MCH: 29.4 pg (ref 26.0–34.0)
MCHC: 32.8 g/dL (ref 30.0–36.0)
MCV: 89.8 fL (ref 80.0–100.0)
Platelets: 165 10*3/uL (ref 150–400)
RBC: 2.65 MIL/uL — ABNORMAL LOW (ref 3.87–5.11)
RDW: 13.4 % (ref 11.5–15.5)
WBC: 9.3 10*3/uL (ref 4.0–10.5)
nRBC: 0 % (ref 0.0–0.2)

## 2019-07-15 LAB — BASIC METABOLIC PANEL
Anion gap: 8 (ref 5–15)
BUN: 14 mg/dL (ref 8–23)
CO2: 22 mmol/L (ref 22–32)
Calcium: 8.2 mg/dL — ABNORMAL LOW (ref 8.9–10.3)
Chloride: 105 mmol/L (ref 98–111)
Creatinine, Ser: 1.59 mg/dL — ABNORMAL HIGH (ref 0.44–1.00)
GFR calc Af Amer: 36 mL/min — ABNORMAL LOW (ref >60)
GFR calc non Af Amer: 31 mL/min — ABNORMAL LOW (ref >60)
Glucose, Bld: 126 mg/dL — ABNORMAL HIGH (ref 70–99)
Potassium: 4 mmol/L (ref 3.5–5.1)
Sodium: 135 mmol/L (ref 135–145)

## 2019-07-15 LAB — GLUCOSE, CAPILLARY
Glucose-Capillary: 116 mg/dL — ABNORMAL HIGH (ref 70–99)
Glucose-Capillary: 151 mg/dL — ABNORMAL HIGH (ref 70–99)
Glucose-Capillary: 116 mg/dL — ABNORMAL HIGH (ref 70–99)
Glucose-Capillary: 142 mg/dL — ABNORMAL HIGH (ref 70–99)

## 2019-07-15 LAB — NOVEL CORONAVIRUS, NAA (HOSP ORDER, SEND-OUT TO REF LAB; TAT 18-24 HRS): SARS-CoV-2, NAA: NOT DETECTED

## 2019-07-15 MED ORDER — ZOLPIDEM TARTRATE 5 MG PO TABS
5.0000 mg | ORAL_TABLET | Freq: Every evening | ORAL | Status: DC | PRN
  Administered 2019-07-15 – 2019-07-16 (×2): 5 mg via ORAL
  Filled 2019-07-15 (×2): qty 1

## 2019-07-15 NOTE — Progress Notes (Signed)
Pt resting in bed, no needs voiced. Awaiting SNF placement.

## 2019-07-15 NOTE — Progress Notes (Signed)
PROGRESS NOTE    Beth Garrison  YKD:[id number]  DOB: 03/09/1941  DOA: 07/07/2019 PCP: System, Pcp Not In  Brief Narrative:   78 y.o. F with DM, HTN, and CKD III baseline Cr 1.2 who presented to the emergency department with weakness, SOB progressive over weeks.patient stated that she slipped and fell while walking out of the bathroom about 5 to 6 days prior to the admission.  Consenting about the pain in the right wrist patient was taking acetaminophen, Advil keeping in soft brace.  Work-up in the emergency department patient was found to have creatinine of 6.6, potassium of 7.5.  X-ray of the right wrist showed wrist fracture, placed on splint per orthopedic recommendations.  The hemoglobin of 7.2, baseline hemoglobin is 12.  Patient received 1 unit of packed RBC.  Patient was noted to have black stools in the emergency department.  Gastroenterology was consulted, underwent EGD, found to have nonbleeding ulcerations NSAID induced.  GI is recommended to continue with the Protonix.  Recommended patient to follow-up with Dr. Collene Mares in 2 weeks.  Patient was also found to have elevated CPK of 2774, improved with the IV hydration.  Assessment & Plan:    ##  Acute renal failure with hyperkalemia/metabolic acidosis: Present on admission. -Secondary to ATN from NSAIDs, ACE inhibitors, diuretics(6.6-->5.28-->3.2-->2.0-->1.6). -Nephrology was consulted, recommended to continue the current management  ##Rhabdomyolysis -Secondary to fall -CPK improving with IV fluids  ##Anemia acute blood loss -Secondary to GI bleed -Patient received 1 unit of packed RBC -Give 1 dose of IV iron  ##GI bleed -NSAID induced ulcers -Patient underwent EGD, found to have pill-induced ulcers -Continue with PPI twice daily -Follow-up with GI as an outpatient  ##Hypertension -Holding lisinopril, diuretics -Blood pressure is currently well controlled  #Right wrist fracture -Currently in splint -Patient will need to  follow-up with orthopedic surgery as an outpatient  ##Diabetes mellitus -Hemoglobin A1c is 5.5 -Metformin is held secondary to acute renal failure -Follow-up on sliding scale insulin  ##Urinary tract infection -Urine cultures positive for E. coli, pansensitive -Continue with the Rocephin, changed to cephalexin  ##Debility -Involved physical therapy  ##Hyperkalemia -Secondary to acute renal failure, rhabdomyolysis -Improving  DVT prophylaxis: SCDs given problem #3 Code Status: Full code Family / Patient Communication: Discussed with patient and all questions answered.d/w GI Disposition Plan: Seen by PT and recommended skilled nursing facility given two-person assist with care of right upper extremity.  Case management aware and looking into options [facility])     Subjective:   Patient tolerating soft diet well.  Still on IV fluids and tolerating well without any complaints of dyspnea or hypoxia..  Objective: Vitals:   07/14/19 0551 07/14/19 1150 07/14/19 2159 07/15/19 1434  BP:  (!) 144/58 (!) 140/45 (!) 130/47  Pulse:  70 80 73  Resp:  18 18 20   Temp:  98.5 F (36.9 C) 98.4 F (36.9 C) 98.2 F (36.8 C)  TempSrc:  Oral Oral Oral  SpO2:  99% 97% 95%  Weight: 83.2 kg     Height:        Intake/Output Summary (Last 24 hours) at 07/15/2019 1613 Last data filed at 07/15/2019 3976 Gross per 24 hour  Intake 240 ml  Output 2400 ml  Net -2160 ml   Filed Weights   07/10/19 1350 07/12/19 0645 07/14/19 0551  Weight: 80 kg 86.3 kg 83.2 kg    Physical Examination:  General exam: Appears calm and comfortable  Respiratory system: Clear to auscultation. Respiratory effort normal. Cardiovascular  system: S1 & S2 heard,?  Mild systolic murmur. No pedal edema. Gastrointestinal system: Abdomen is nondistended, soft and nontender. No organomegaly or masses felt. Normal bowel sounds heard. Central nervous system: Alert and oriented. No focal neurological deficits.  Extremities: Splint along right wrist/forearm Skin: No rashes, lesions or ulcers Psychiatry: Judgement and insight appear normal. Mood & affect appropriate.     Data Reviewed: I have personally reviewed following labs and imaging studies  CBC: Recent Labs  Lab 07/10/19 0239 07/11/19 0454 07/12/19 0243 07/13/19 0552 07/14/19 0347 07/15/19 0236  WBC 9.0 7.7 8.5 8.1 9.5 9.3  NEUTROABS 6.3 4.9 6.0 4.9 6.6  --   HGB 7.4* 7.5* 7.5* 7.9* 8.4* 7.8*  HCT 22.9* 23.1* 23.2* 24.3* 25.4* 23.8*  MCV 90.5 91.7 90.3 91.0 90.4 89.8  PLT 167 161 165 161 172 408   Basic Metabolic Panel: Recent Labs  Lab 07/10/19 0239 07/11/19 0454 07/12/19 0243 07/13/19 0552 07/14/19 0347 07/15/19 0236  NA 140 139 136 135 135 135  K 3.2* 3.1* 3.7 4.1 4.2 4.0  CL 105 106 105 106 105 105  CO2 25 22 19* 20* 22 22  GLUCOSE 143* 119* 106* 112* 149* 126*  BUN 45* 34* 26* 21 17 14   CREATININE 2.50* 2.10* 2.02* 1.63* 1.50* 1.59*  CALCIUM 7.2* 7.5* 7.8* 8.2* 8.2* 8.2*  PHOS 3.8 3.4 2.5 2.8 3.2  --    GFR: Estimated Creatinine Clearance: 32.1 mL/min (A) (by C-G formula based on SCr of 1.59 mg/dL (H)). Liver Function Tests: Recent Labs  Lab 07/10/19 0239 07/11/19 0454 07/12/19 0243 07/13/19 0552 07/14/19 0347  ALBUMIN 2.1* 2.0* 2.1* 2.2* 2.2*   No results for input(s): LIPASE, AMYLASE in the last 168 hours. No results for input(s): AMMONIA in the last 168 hours. Coagulation Profile: No results for input(s): INR, PROTIME in the last 168 hours. Cardiac Enzymes: Recent Labs  Lab 07/10/19 0239 07/11/19 0454 07/12/19 0243 07/13/19 0552 07/14/19 0347  CKTOTAL 428* 202 105 56 45   BNP (last 3 results) No results for input(s): PROBNP in the last 8760 hours. HbA1C: No results for input(s): HGBA1C in the last 72 hours. CBG: Recent Labs  Lab 07/14/19 1149 07/14/19 1657 07/14/19 2156 07/15/19 0755 07/15/19 1206  GLUCAP 107* 117* 137* 116* 151*   Lipid Profile: No results for input(s): CHOL,  HDL, LDLCALC, TRIG, CHOLHDL, LDLDIRECT in the last 72 hours. Thyroid Function Tests: No results for input(s): TSH, T4TOTAL, FREET4, T3FREE, THYROIDAB in the last 72 hours. Anemia Panel: No results for input(s): VITAMINB12, FOLATE, FERRITIN, TIBC, IRON, RETICCTPCT in the last 72 hours. Sepsis Labs: No results for input(s): PROCALCITON, LATICACIDVEN in the last 168 hours.  Recent Results (from the past 240 hour(s))  Culture, blood (Routine x 2)     Status: None   Collection Time: 07/07/19  2:25 PM   Specimen: BLOOD  Result Value Ref Range Status   Specimen Description   Final    BLOOD LEFT ANTECUBITAL Performed at [facility] [address] [place], [place] [address]    Special Requests   Final    BOTTLES DRAWN AEROBIC AND ANAEROBIC Blood Culture results may not be optimal due to an excessive volume of blood received in culture bottles Performed at [facility] [address], [place], [place] [address]    Culture   Final    NO GROWTH 5 DAYS Performed at [facility], [place] [address], [place], [place] [address]    Report Status  07/12/2019 FINAL  Final  Culture, blood (Routine x 2)     Status: Abnormal   Collection Time: 07/07/19  2:25 PM   Specimen: BLOOD LEFT HAND  Result Value Ref Range Status   Specimen Description   Final    BLOOD LEFT HAND Performed at [facility] [address] [place], [place] [address]    Special Requests   Final    BOTTLES DRAWN AEROBIC AND ANAEROBIC Blood Culture results may not be optimal due to an excessive volume of blood received in culture bottles Performed at [facility] [address], [place], [place] [address]    Culture  Setup Time   Final    GRAM POSITIVE COCCI AEROBIC BOTTLE ONLY CRITICAL RESULT CALLED TO, READ BACK BY AND VERIFIED WITH: Dawson Bills PharmD 15:40 07/08/19 ([email or web address])    Culture (A)  Final    STAPHYLOCOCCUS SPECIES (COAGULASE NEGATIVE)  THE SIGNIFICANCE OF ISOLATING THIS ORGANISM FROM A SINGLE SET OF BLOOD CULTURES WHEN MULTIPLE SETS ARE DRAWN IS UNCERTAIN. PLEASE NOTIFY THE MICROBIOLOGY DEPARTMENT WITHIN ONE WEEK IF SPECIATION AND SENSITIVITIES ARE REQUIRED. Performed at [facility], [place] [address] [place], [place] [address]    Report Status 07/10/2019 FINAL  Final  Blood Culture ID Panel (Reflexed)     Status: Abnormal   Collection Time: 07/07/19  2:25 PM  Result Value Ref Range Status   Enterococcus species NOT DETECTED NOT DETECTED Final   Listeria monocytogenes NOT DETECTED NOT DETECTED Final   Staphylococcus species DETECTED (A) NOT DETECTED Final    Comment: Methicillin (oxacillin) susceptible coagulase negative staphylococcus. Possible blood culture contaminant (unless isolated from more than one blood culture draw or clinical case suggests pathogenicity). No antibiotic treatment is indicated for blood  culture contaminants. CRITICAL RESULT CALLED TO, READ BACK BY AND VERIFIED WITH: Dawson Bills PharmD 15:40 07/08/19 ([email or web address])    Staphylococcus aureus (BCID) NOT DETECTED NOT DETECTED Final   Methicillin resistance NOT DETECTED NOT DETECTED Final   Streptococcus species NOT DETECTED NOT DETECTED Final   Streptococcus agalactiae NOT DETECTED NOT DETECTED Final   Streptococcus pneumoniae NOT DETECTED NOT DETECTED Final   Streptococcus pyogenes NOT DETECTED NOT DETECTED Final   Acinetobacter baumannii NOT DETECTED NOT DETECTED Final   Enterobacteriaceae species NOT DETECTED NOT DETECTED Final   Enterobacter cloacae complex NOT DETECTED NOT DETECTED Final   Escherichia coli NOT DETECTED NOT DETECTED Final   Klebsiella oxytoca NOT DETECTED NOT DETECTED Final   Klebsiella pneumoniae NOT DETECTED NOT DETECTED Final   Proteus species NOT DETECTED NOT DETECTED Final   Serratia marcescens NOT DETECTED NOT DETECTED Final   Haemophilus influenzae NOT DETECTED NOT DETECTED Final   Neisseria meningitidis NOT DETECTED  NOT DETECTED Final   Pseudomonas aeruginosa NOT DETECTED NOT DETECTED Final   Candida albicans NOT DETECTED NOT DETECTED Final   Candida glabrata NOT DETECTED NOT DETECTED Final   Candida krusei NOT DETECTED NOT DETECTED Final   Candida parapsilosis NOT DETECTED NOT DETECTED Final   Candida tropicalis NOT DETECTED NOT DETECTED Final    Comment: Performed at [facility], [address] [place], [place] [address]  Urine culture     Status: Abnormal   Collection Time: 07/07/19  2:31 PM   Specimen: Urine, Random  Result Value Ref Range Status   Specimen Description   Final    URINE, RANDOM Performed at [facility] [address], [place], [place] [address]    Special Requests  Final    NONE Performed at [facility], [place] [address] [place], [place] [address]    Culture (A)  Final    >=100,000 COLONIES/mL ESCHERICHIA COLI 70,000 COLONIES/mL KLEBSIELLA PNEUMONIAE    Report Status 07/10/2019 FINAL  Final   Organism ID, Bacteria ESCHERICHIA COLI (A)  Final   Organism ID, Bacteria KLEBSIELLA PNEUMONIAE (A)  Final      Susceptibility   Escherichia coli - MIC*    AMPICILLIN <=2 SENSITIVE Sensitive     CEFAZOLIN <=4 SENSITIVE Sensitive     CEFTRIAXONE <=1 SENSITIVE Sensitive     CIPROFLOXACIN <=0.25 SENSITIVE Sensitive     GENTAMICIN <=1 SENSITIVE Sensitive     IMIPENEM <=0.25 SENSITIVE Sensitive     NITROFURANTOIN <=16 SENSITIVE Sensitive     TRIMETH/SULFA <=20 SENSITIVE Sensitive     AMPICILLIN/SULBACTAM <=2 SENSITIVE Sensitive     PIP/TAZO <=4 SENSITIVE Sensitive     Extended ESBL NEGATIVE Sensitive     * >=100,000 COLONIES/mL ESCHERICHIA COLI   Klebsiella pneumoniae - MIC*    AMPICILLIN RESISTANT Resistant     CEFAZOLIN <=4 SENSITIVE Sensitive     CEFTRIAXONE <=1 SENSITIVE Sensitive     CIPROFLOXACIN <=0.25 SENSITIVE Sensitive     GENTAMICIN <=1 SENSITIVE Sensitive     IMIPENEM <=0.25 SENSITIVE Sensitive     NITROFURANTOIN <=16  SENSITIVE Sensitive     TRIMETH/SULFA <=20 SENSITIVE Sensitive     AMPICILLIN/SULBACTAM <=2 SENSITIVE Sensitive     PIP/TAZO <=4 SENSITIVE Sensitive     Extended ESBL NEGATIVE Sensitive     * 70,000 COLONIES/mL KLEBSIELLA PNEUMONIAE  SARS Coronavirus 2 [facility] order, Performed in [place] hospital lab) Nasopharyngeal Nasopharyngeal Swab     Status: None   Collection Time: 07/07/19  2:31 PM   Specimen: Nasopharyngeal Swab  Result Value Ref Range Status   SARS Coronavirus 2 NEGATIVE NEGATIVE Final    Comment: (NOTE) If result is NEGATIVE SARS-CoV-2 target nucleic acids are NOT DETECTED. The SARS-CoV-2 RNA is generally detectable in upper and lower  respiratory specimens during the acute phase of infection. The lowest  concentration of SARS-CoV-2 viral copies this assay can detect is 250  copies / mL. A negative result does not preclude SARS-CoV-2 infection  and should not be used as the sole basis for treatment or other  patient management decisions.  A negative result may occur with  improper specimen collection / handling, submission of specimen other  than nasopharyngeal swab, presence of viral mutation(s) within the  areas targeted by this assay, and inadequate number of viral copies  (<250 copies / mL). A negative result must be combined with clinical  observations, patient history, and epidemiological information. If result is POSITIVE SARS-CoV-2 target nucleic acids are DETECTED. The SARS-CoV-2 RNA is generally detectable in upper and lower  respiratory specimens dur ing the acute phase of infection.  Positive  results are indicative of active infection with SARS-CoV-2.  Clinical  correlation with patient history and other diagnostic information is  necessary to determine patient infection status.  Positive results do  not rule out bacterial infection or co-infection with other viruses. If result is PRESUMPTIVE POSTIVE SARS-CoV-2 nucleic acids MAY BE PRESENT.   A  presumptive positive result was obtained on the submitted specimen  and confirmed on repeat testing.  While 2019 novel coronavirus  (SARS-CoV-2) nucleic acids may be present in the submitted sample  additional confirmatory testing may be necessary for epidemiological  and / or clinical management purposes  to  differentiate between  SARS-CoV-2 and other Sarbecovirus currently known to infect humans.  If clinically indicated additional testing with an alternate test  methodology [phone]) is advised. The SARS-CoV-2 RNA is generally  detectable in upper and lower respiratory sp ecimens during the acute  phase of infection. The expected result is Negative. Fact Sheet for Patients:  StrictlyIdeas.no Fact Sheet for Healthcare Providers: BankingDealers.co.za This test is not yet approved or cleared by the [place] FDA and has been authorized for detection and/or diagnosis of SARS-CoV-2 by FDA under an Emergency Use Authorization (EUA).  This EUA will remain in effect (meaning this test can be used) for the duration of the COVID-19 declaration under Section 564(b)(1) of the Act, 21 U.S.C. section 360bbb-3(b)(1), unless the authorization is terminated or revoked sooner. Performed at [facility], [place] [address] [place], [place] [address]   MRSA PCR Screening     Status: None   Collection Time: 07/08/19  5:40 AM   Specimen: Nasal Mucosa; Nasopharyngeal  Result Value Ref Range Status   MRSA by PCR NEGATIVE NEGATIVE Final    Comment:        The GeneXpert MRSA Assay (FDA approved for NASAL specimens only), is one component of a comprehensive MRSA colonization surveillance program. It is not intended to diagnose MRSA infection nor to guide or monitor treatment for MRSA infections. Performed at [facility], [place] [address], [place], [place] [address]   Culture, blood (Routine X 2) w Reflex to ID Panel     Status:  None   Collection Time: 07/09/19  7:55 AM   Specimen: BLOOD LEFT HAND  Result Value Ref Range Status   Specimen Description BLOOD LEFT HAND  Final   Special Requests   Final    BOTTLES DRAWN AEROBIC ONLY Blood Culture adequate volume   Culture   Final    NO GROWTH 5 DAYS Performed at [facility], [place] [address], [place], [place] [address]    Report Status 07/14/2019 FINAL  Final  Culture, blood (Routine X 2) w Reflex to ID Panel     Status: None   Collection Time: 07/09/19  7:56 AM   Specimen: BLOOD LEFT HAND  Result Value Ref Range Status   Specimen Description BLOOD LEFT HAND  Final   Special Requests   Final    BOTTLES DRAWN AEROBIC ONLY Blood Culture results may not be optimal due to an inadequate volume of blood received in culture bottles   Culture   Final    NO GROWTH 5 DAYS Performed at [facility], [place] [address] [place], [place] [address]    Report Status 07/14/2019 FINAL  Final      Radiology Studies: No results found.      Scheduled Meds: . amLODipine  5 mg Oral Daily  . insulin aspart  0-9 Units Subcutaneous TID WC  . pantoprazole  40 mg Oral BID  . sodium chloride flush  3 mL Intravenous Once   Continuous Infusions:      LOS: 8 days    Time spent: 35 minutes    Tomesha Sargent, MD [facility] Pager [phone]  If 7PM-7AM, please contact night-coverage [email or web address] Password [facility] 07/15/2019, 4:13 PM

## 2019-07-15 NOTE — Clinical Social Work Note (Signed)
Patient medically stable for discharge [facility] and authorization has been received [facility]). Patient will discharge once COVID results are in. Bryson Ha, admissions director advised that COVID results still pending as of 4:49 pm. As of 6:23 pm - test results still pending. CSW will continue to follow and facilitate discharge to SNF once test results in.  Ginnifer Creelman Givens, MSW, LCSW Licensed Clinical Social Worker [place] [phone]

## 2019-07-16 DIAGNOSIS — D520 Dietary folate deficiency anemia: Secondary | ICD-10-CM

## 2019-07-16 LAB — GLUCOSE, CAPILLARY
Glucose-Capillary: 120 mg/dL — ABNORMAL HIGH (ref 70–99)
Glucose-Capillary: 144 mg/dL — ABNORMAL HIGH (ref 70–99)
Glucose-Capillary: 107 mg/dL — ABNORMAL HIGH (ref 70–99)
Glucose-Capillary: 127 mg/dL — ABNORMAL HIGH (ref 70–99)

## 2019-07-16 MED ORDER — AMLODIPINE BESYLATE 5 MG PO TABS: 5.0000 mg | ORAL_TABLET | Freq: Every day | ORAL | Status: DC

## 2019-07-16 MED ORDER — PANTOPRAZOLE SODIUM 40 MG PO TBEC: 40.0000 mg | DELAYED_RELEASE_TABLET | Freq: Two times a day (BID) | ORAL | Status: DC

## 2019-07-16 NOTE — Plan of Care (Signed)
Plan of care met

## 2019-07-16 NOTE — Discharge Summary (Signed)
Physician Discharge Summary  Beth Garrison SWF:[id number] DOB: November 11, 1941 DOA: 07/07/2019  PCP: System, Pcp Not In  Admit date: 07/07/2019 Discharge date: 07/16/2019  Time spent: 40 minutes  Recommendations for Outpatient Follow-up:  1. Follow-up with primary care physician in 1 week 2. Discharging to [facility] Follow-up with orthopedic surgery in 1 week  Discharge Diagnoses:  Principal Problem:   Hyperkalemia Active Problems:   Type II diabetes mellitus with manifestations ([place])   Hyperlipidemia with target LDL less than 100   HTN (hypertension)   Renal failure   AKI (acute kidney injury) ([place])   Anemia   Melena   Discharge Condition: Stable  Diet recommendation: Cardiac, diabetic  Filed Weights   07/12/19 0645 07/14/19 0551 07/16/19 0602  Weight: 86.3 kg 83.2 kg 77 kg    History of present illness and Hospital Course:  78 y.o.Fwith DM, HTN, and CKD III baseline Cr 1.2 who presented to the emergency department with weakness, SOB progressive over weeks.patient stated that she slipped and fell while walking out of the bathroom about 5 to 6 days prior to the admission.  Consenting about the pain in the right wrist patient was taking acetaminophen, Advil keeping in soft brace.  Work-up in the emergency department patient was found to have creatinine of 6.6, potassium of 7.5.  X-ray of the right wrist showed wrist fracture, placed on splint per orthopedic recommendations.  The hemoglobin of 7.2, baseline hemoglobin is 12.  Patient received 1 unit of packed RBC.  Patient was noted to have black stools in the emergency department.  Gastroenterology was consulted, underwent EGD, found to have nonbleeding ulcerations NSAID induced.  GI is recommended to continue with the Protonix.  Recommended patient to follow-up with Dr. Collene Mares in 2 weeks.  Patient was also found to have elevated CPK of 2774, improved with the IV hydration.  Evaluated by physical therapy, recommended skilled nursing  facility placement.  Patient will need to follow-up with orthopedic surgery as an outpatient.  Assessment & Plan:    ##  Acute renal failure with hyperkalemia/metabolic acidosis: Present on admission. -Secondary to ATN from NSAIDs, ACE inhibitors, diuretics(6.6-->5.28-->3.2-->2.0-->1.6). -Nephrology was consulted, recommended to continue the current management  ##Rhabdomyolysis -Secondary to fall -CPK improving with IV fluids  ##Anemia acute blood loss -Secondary to GI bleed -Patient received 1 unit of packed RBC   ##GI bleed -NSAID induced ulcers -Patient underwent EGD, found to have pill-induced ulcers -Continue with PPI twice daily -Follow-up with GI as an outpatient  ##Hypertension -Holding lisinopril, diuretics -Blood pressure is currently well controlled  #Right wrist fracture -Currently in splint -Patient will need to follow-up with orthopedic surgery as an outpatient  ##Diabetes mellitus -Hemoglobin A1c is 5.5 -Metformin is held secondary to acute renal failure -Follow-up on sliding scale insulin  ##Urinary tract infection -Urine cultures positive for E. coli, pansensitive -Continue with the Rocephin, changed to cephalexin  ##Debility -Involved physical therapy  ##Hyperkalemia -Secondary to acute renal failure, rhabdomyolysis -Improving  Procedures: EGD  consultations:  Nephrology  Gastroenterology  Discharge Exam: Vitals:   07/16/19 0532 07/16/19 1234  BP: (!) 106/35 (!) 110/57  Pulse: 69 70  Resp: 18 16  Temp: 98.2 F (36.8 C) 98.7 F (37.1 C)  SpO2: 99% 99%    General exam: Appears calm and comfortable  Respiratory system: Clear to auscultation. Respiratory effort normal. Cardiovascular system: S1 & S2 heard,?  Mild systolic murmur. No pedal edema. Gastrointestinal system: Abdomen is nondistended, soft and nontender. No organomegaly or masses felt. Normal  bowel sounds heard. Central nervous system: Alert and oriented. No  focal neurological deficits. Extremities: Splint along right wrist/forearm Skin: No rashes, lesions or ulcers Psychiatry: Judgement and insight appear normal. Mood & affect appropriate.   Discharge Instructions   Discharge Instructions    Diet - low sodium heart healthy   Complete by: As directed    Discharge instructions   Complete by: As directed    Nonweight bearing to rt arm F/u with orthopedic surgery in 1 week   Increase activity slowly   Complete by: As directed      Allergies as of 07/16/2019   No Known Allergies     Medication List    STOP taking these medications   cimetidine 200 MG tablet Commonly known as: TAGAMET   DRY EYE RELIEF DROPS OP   gabapentin 100 MG capsule Commonly known as: NEURONTIN   glucose blood test strip Commonly known as: Accu-Chek Compact Plus   insulin regular 100 units/mL injection Commonly known as: NOVOLIN R   lisinopril 20 MG tablet Commonly known as: ZESTRIL   lisinopril-hydrochlorothiazide 20-25 MG tablet Commonly known as: ZESTORETIC   metFORMIN 500 MG tablet Commonly known as: GLUCOPHAGE   metFORMIN 850 MG tablet Commonly known as: GLUCOPHAGE   rosuvastatin 20 MG tablet Commonly known as: CRESTOR     TAKE these medications   amLODipine 5 MG tablet Commonly known as: NORVASC Take 1 tablet (5 mg total) by mouth daily. Start taking on: July 17, 2019   pantoprazole 40 MG tablet Commonly known as: PROTONIX Take 1 tablet (40 mg total) by mouth 2 (two) times daily.   pravastatin 40 MG tablet Commonly known as: PRAVACHOL Take 1 tablet (40 mg total) by mouth daily.      No Known Allergies  Contact information for follow-up providers    Fayette Pho, MD. Schedule an appointment as soon as possible for a visit in 1 week(s).   Specialty: Orthopedic Surgery Why: f/u of rt wrist fx Contact information: [address] [place] [address] [phone]        Juanita Craver, MD. Schedule an appointment as  soon as possible for a visit in 2 week(s).   Specialty: Gastroenterology Contact information: [address], [place] [address] [phone]            Contact information for after-discharge care    Destination    [facility] .   Service: Skilled Nursing Contact information: [address] [place] [phone]                   The results of significant diagnostics from this hospitalization (including imaging, microbiology, ancillary and laboratory) are listed below for reference.    Significant Diagnostic Studies: Dg Wrist Complete Right  Result Date: 07/07/2019 CLINICAL DATA:  Wrist pain secondary to a fall 1 week ago. EXAM: RIGHT WRIST - COMPLETE 3+ VIEW COMPARISON:  None. FINDINGS: There is an impacted fracture metaphysis of the distal right radius. The articular surface of the radius appears to be intact. Minimal lateral angulation. Normal dorsal or volar angulation. There is a fracture through medial aspect of the distal ulna with minimal distraction. Chondrocalcinosis. Arthritic changes at the radial side of the wrist. Slight arthritic changes of the MCP joints. IMPRESSION: Fractures of the distal radius and ulna as described. Electronically Signed   By: Lorriane Shire M.D.   On: 07/07/2019 15:00   US Renal  Result Date: 07/07/2019  CLINICAL DATA:  Acute kidney injury EXAM: RENAL / URINARY TRACT ULTRASOUND COMPLETE COMPARISON:  None. FINDINGS: Right Kidney: Renal measurements: 10.3 x 4.4 x 5.8 cm = volume: 139.2 mL . Echogenicity within normal limits. No mass or hydronephrosis visualized. Left Kidney: Renal measurements: 8.2 x 3.4 x 4 cm = volume: 59.5 mL. Cortical echogenicity within normal limits. No hydronephrosis. Probable mild thinning of the cortex. Bladder: Appears normal for degree of bladder distention. IMPRESSION: 1. Negative for hydronephrosis. 2. Slight asymmetric renal size left smaller  than right with possible mild renal cortical thinning/atrophy on the left Electronically Signed   By: Donavan Foil M.D.   On: 07/07/2019 18:14   Dg Chest Port 1 View  Result Date: 07/07/2019 CLINICAL DATA:  Status post left jugular central line placement EXAM: PORTABLE CHEST 1 VIEW COMPARISON:  Film from earlier in the same day. FINDINGS: Cardiac shadow is stable. The lungs are well aerated bilaterally. No focal infiltrate or sizable effusion is seen. No pneumothorax is noted. No bony abnormality is seen. Left jugular central line is noted at the cavoatrial junction. IMPRESSION: No pneumothorax following central line placement. Electronically Signed   By: Inez Catalina M.D.   On: 07/07/2019 23:09   Dg Chest Portable 1 View  Result Date: 07/07/2019 CLINICAL DATA:  Weakness, diarrhea and shortness of breath today. EXAM: PORTABLE CHEST 1 VIEW COMPARISON:  PA and lateral chest 07/05/2010. FINDINGS: Lungs are clear. Heart size is normal. Aortic atherosclerosis noted. No pneumothorax or pleural fluid. No acute or focal bony abnormality. IMPRESSION: No acute disease. Atherosclerosis. Electronically Signed   By: Inge Rise M.D.   On: 07/07/2019 14:56    Microbiology: Recent Results (from the past 240 hour(s))  Culture, blood (Routine x 2)     Status: None   Collection Time: 07/07/19  2:25 PM   Specimen: BLOOD  Result Value Ref Range Status   Specimen Description   Final    BLOOD LEFT ANTECUBITAL Performed at [facility] [address], [place], [place] [address]    Special Requests   Final    BOTTLES DRAWN AEROBIC AND ANAEROBIC Blood Culture results may not be optimal due to an excessive volume of blood received in culture bottles Performed at [facility] [address] [place], [place] [address]    Culture   Final    NO GROWTH 5 DAYS Performed at [facility], [place] [address], [place], [place] [address]    Report Status 07/12/2019 FINAL  Final   Culture, blood (Routine x 2)     Status: Abnormal   Collection Time: 07/07/19  2:25 PM   Specimen: BLOOD LEFT HAND  Result Value Ref Range Status   Specimen Description   Final    BLOOD LEFT HAND Performed at [facility] [address], [place], [place] [address]    Special Requests   Final    BOTTLES DRAWN AEROBIC AND ANAEROBIC Blood Culture results may not be optimal due to an excessive volume of blood received in culture bottles Performed at [facility] [address] [place], [place] [address]    Culture  Setup Time   Final    GRAM POSITIVE COCCI AEROBIC BOTTLE ONLY CRITICAL RESULT CALLED TO, READ BACK BY AND VERIFIED WITH: Dawson Bills PharmD 15:40 07/08/19 ([email or web address])    Culture (A)  Final    STAPHYLOCOCCUS SPECIES (COAGULASE NEGATIVE) THE SIGNIFICANCE OF ISOLATING THIS ORGANISM FROM A SINGLE SET OF BLOOD CULTURES  WHEN MULTIPLE SETS ARE DRAWN IS UNCERTAIN. PLEASE NOTIFY THE MICROBIOLOGY DEPARTMENT WITHIN ONE WEEK IF SPECIATION AND SENSITIVITIES ARE REQUIRED. Performed at [facility], [place] [address] [place], [place] [address]    Report Status 07/10/2019 FINAL  Final  Blood Culture ID Panel (Reflexed)     Status: Abnormal   Collection Time: 07/07/19  2:25 PM  Result Value Ref Range Status   Enterococcus species NOT DETECTED NOT DETECTED Final   Listeria monocytogenes NOT DETECTED NOT DETECTED Final   Staphylococcus species DETECTED (A) NOT DETECTED Final    Comment: Methicillin (oxacillin) susceptible coagulase negative staphylococcus. Possible blood culture contaminant (unless isolated from more than one blood culture draw or clinical case suggests pathogenicity). No antibiotic treatment is indicated for blood  culture contaminants. CRITICAL RESULT CALLED TO, READ BACK BY AND VERIFIED WITH: Dawson Bills PharmD 15:40 07/08/19 ([email or web address])    Staphylococcus aureus (BCID) NOT DETECTED NOT DETECTED Final   Methicillin resistance NOT  DETECTED NOT DETECTED Final   Streptococcus species NOT DETECTED NOT DETECTED Final   Streptococcus agalactiae NOT DETECTED NOT DETECTED Final   Streptococcus pneumoniae NOT DETECTED NOT DETECTED Final   Streptococcus pyogenes NOT DETECTED NOT DETECTED Final   Acinetobacter baumannii NOT DETECTED NOT DETECTED Final   Enterobacteriaceae species NOT DETECTED NOT DETECTED Final   Enterobacter cloacae complex NOT DETECTED NOT DETECTED Final   Escherichia coli NOT DETECTED NOT DETECTED Final   Klebsiella oxytoca NOT DETECTED NOT DETECTED Final   Klebsiella pneumoniae NOT DETECTED NOT DETECTED Final   Proteus species NOT DETECTED NOT DETECTED Final   Serratia marcescens NOT DETECTED NOT DETECTED Final   Haemophilus influenzae NOT DETECTED NOT DETECTED Final   Neisseria meningitidis NOT DETECTED NOT DETECTED Final   Pseudomonas aeruginosa NOT DETECTED NOT DETECTED Final   Candida albicans NOT DETECTED NOT DETECTED Final   Candida glabrata NOT DETECTED NOT DETECTED Final   Candida krusei NOT DETECTED NOT DETECTED Final   Candida parapsilosis NOT DETECTED NOT DETECTED Final   Candida tropicalis NOT DETECTED NOT DETECTED Final    Comment: Performed at [facility], [address] [place], [place] [address]  Urine culture     Status: Abnormal   Collection Time: 07/07/19  2:31 PM   Specimen: Urine, Random  Result Value Ref Range Status   Specimen Description   Final    URINE, RANDOM Performed at [facility] [address], [place], [place] [address]    Special Requests   Final    NONE Performed at [facility], [place] [address] [place], [place] [address]    Culture (A)  Final    >=100,000 COLONIES/mL ESCHERICHIA COLI 70,000 COLONIES/mL KLEBSIELLA PNEUMONIAE    Report Status 07/10/2019 FINAL  Final   Organism ID, Bacteria ESCHERICHIA COLI (A)  Final   Organism ID, Bacteria KLEBSIELLA PNEUMONIAE (A)  Final      Susceptibility   Escherichia  coli - MIC*    AMPICILLIN <=2 SENSITIVE Sensitive     CEFAZOLIN <=4 SENSITIVE Sensitive     CEFTRIAXONE <=1 SENSITIVE Sensitive     CIPROFLOXACIN <=0.25 SENSITIVE Sensitive     GENTAMICIN <=1 SENSITIVE Sensitive     IMIPENEM <=0.25 SENSITIVE Sensitive     NITROFURANTOIN <=16 SENSITIVE Sensitive     TRIMETH/SULFA <=20 SENSITIVE Sensitive     AMPICILLIN/SULBACTAM <=2 SENSITIVE Sensitive     PIP/TAZO <=4 SENSITIVE Sensitive     Extended ESBL NEGATIVE Sensitive     * >=100,000  COLONIES/mL ESCHERICHIA COLI   Klebsiella pneumoniae - MIC*    AMPICILLIN RESISTANT Resistant     CEFAZOLIN <=4 SENSITIVE Sensitive     CEFTRIAXONE <=1 SENSITIVE Sensitive     CIPROFLOXACIN <=0.25 SENSITIVE Sensitive     GENTAMICIN <=1 SENSITIVE Sensitive     IMIPENEM <=0.25 SENSITIVE Sensitive     NITROFURANTOIN <=16 SENSITIVE Sensitive     TRIMETH/SULFA <=20 SENSITIVE Sensitive     AMPICILLIN/SULBACTAM <=2 SENSITIVE Sensitive     PIP/TAZO <=4 SENSITIVE Sensitive     Extended ESBL NEGATIVE Sensitive     * 70,000 COLONIES/mL KLEBSIELLA PNEUMONIAE  SARS Coronavirus 2 [facility] order, Performed in [facility] hospital lab) Nasopharyngeal Nasopharyngeal Swab     Status: None   Collection Time: 07/07/19  2:31 PM   Specimen: Nasopharyngeal Swab  Result Value Ref Range Status   SARS Coronavirus 2 NEGATIVE NEGATIVE Final    Comment: (NOTE) If result is NEGATIVE SARS-CoV-2 target nucleic acids are NOT DETECTED. The SARS-CoV-2 RNA is generally detectable in upper and lower  respiratory specimens during the acute phase of infection. The lowest  concentration of SARS-CoV-2 viral copies this assay can detect is 250  copies / mL. A negative result does not preclude SARS-CoV-2 infection  and should not be used as the sole basis for treatment or other  patient management decisions.  A negative result may occur with  improper specimen collection / handling, submission of specimen other  than nasopharyngeal swab, presence  of viral mutation(s) within the  areas targeted by this assay, and inadequate number of viral copies  (<250 copies / mL). A negative result must be combined with clinical  observations, patient history, and epidemiological information. If result is POSITIVE SARS-CoV-2 target nucleic acids are DETECTED. The SARS-CoV-2 RNA is generally detectable in upper and lower  respiratory specimens dur ing the acute phase of infection.  Positive  results are indicative of active infection with SARS-CoV-2.  Clinical  correlation with patient history and other diagnostic information is  necessary to determine patient infection status.  Positive results do  not rule out bacterial infection or co-infection with other viruses. If result is PRESUMPTIVE POSTIVE SARS-CoV-2 nucleic acids MAY BE PRESENT.   A presumptive positive result was obtained on the submitted specimen  and confirmed on repeat testing.  While 2019 novel coronavirus  (SARS-CoV-2) nucleic acids may be present in the submitted sample  additional confirmatory testing may be necessary for epidemiological  and / or clinical management purposes  to differentiate between  SARS-CoV-2 and other Sarbecovirus currently known to infect humans.  If clinically indicated additional testing with an alternate test  methodology [phone]) is advised. The SARS-CoV-2 RNA is generally  detectable in upper and lower respiratory sp ecimens during the acute  phase of infection. The expected result is Negative. Fact Sheet for Patients:  StrictlyIdeas.no Fact Sheet for Healthcare Providers: BankingDealers.co.za This test is not yet approved or cleared by the [place] FDA and has been authorized for detection and/or diagnosis of SARS-CoV-2 by FDA under an Emergency Use Authorization (EUA).  This EUA will remain in effect (meaning this test can be used) for the duration of the COVID-19 declaration under Section  564(b)(1) of the Act, 21 U.S.C. section 360bbb-3(b)(1), unless the authorization is terminated or revoked sooner. Performed at [facility], [place] [address] [place], [place] [address]   MRSA PCR Screening     Status: None   Collection Time: 07/08/19  5:40 AM   Specimen: Nasal  Mucosa; Nasopharyngeal  Result Value Ref Range Status   MRSA by PCR NEGATIVE NEGATIVE Final    Comment:        The GeneXpert MRSA Assay (FDA approved for NASAL specimens only), is one component of a comprehensive MRSA colonization surveillance program. It is not intended to diagnose MRSA infection nor to guide or monitor treatment for MRSA infections. Performed at [facility], [place] [address], [place], [place] [address]   Culture, blood (Routine X 2) w Reflex to ID Panel     Status: None   Collection Time: 07/09/19  7:55 AM   Specimen: BLOOD LEFT HAND  Result Value Ref Range Status   Specimen Description BLOOD LEFT HAND  Final   Special Requests   Final    BOTTLES DRAWN AEROBIC ONLY Blood Culture adequate volume   Culture   Final    NO GROWTH 5 DAYS Performed at [facility], [place] [address], [place], [place] [address]    Report Status 07/14/2019 FINAL  Final  Culture, blood (Routine X 2) w Reflex to ID Panel     Status: None   Collection Time: 07/09/19  7:56 AM   Specimen: BLOOD LEFT HAND  Result Value Ref Range Status   Specimen Description BLOOD LEFT HAND  Final   Special Requests   Final    BOTTLES DRAWN AEROBIC ONLY Blood Culture results may not be optimal due to an inadequate volume of blood received in culture bottles   Culture   Final    NO GROWTH 5 DAYS Performed at [facility], [place] [address], [place], [place] [address]    Report Status 07/14/2019 FINAL  Final  Novel Coronavirus, NAA (hospital order; send-out to ref lab)     Status: None   Collection Time: 07/14/19  9:48 AM   Specimen: Nasopharyngeal Swab; Respiratory  Result Value Ref Range Status    SARS-CoV-2, NAA NOT DETECTED NOT DETECTED Final    Comment: (NOTE) This test was developed and its performance characteristics determined by [employer]. This test has not been FDA cleared or approved. This test has been authorized by FDA under an Emergency Use Authorization (EUA). This test is only authorized for the duration of time the declaration that circumstances exist justifying the authorization of the emergency use of in vitro diagnostic tests for detection of SARS-CoV-2 virus and/or diagnosis of COVID-19 infection under section 564(b)(1) of the Act, 21 U.S.C. 324MWN-0(U)(7), unless the authorization is terminated or revoked sooner. When diagnostic testing is negative, the possibility of a false negative result should be considered in the context of a patient's recent exposures and the presence of clinical signs and symptoms consistent with COVID-19. An individual without symptoms of COVID-19 and who is not shedding SARS-CoV-2 virus would expect to have a negative (not detected) result in this assay. Performed  At: [facility] [address] [place], [place] [id number] Rush Farmer MD KV:[id number]    [place]  Final    Comment: Performed at [facility], [place] [address] [place] [address], [place],  [address]     Labs: Basic Metabolic Panel: Recent Labs  Lab 07/10/19 0239 07/11/19 0454 07/12/19 0243 07/13/19 0552 07/14/19 0347 07/15/19 0236  NA 140 139 136 135 135 135  K 3.2* 3.1* 3.7 4.1 4.2 4.0  CL 105 106 105 106 105 105  CO2 25 22 19* 20* 22 22  GLUCOSE 143* 119* 106* 112* 149* 126*  BUN 45* 34* 26* 21 17  14  CREATININE 2.50* 2.10* 2.02* 1.63* 1.50* 1.59*  CALCIUM 7.2* 7.5* 7.8* 8.2* 8.2* 8.2*  PHOS 3.8 3.4 2.5 2.8 3.2  --    Liver Function Tests: Recent Labs  Lab 07/10/19 0239 07/11/19 0454 07/12/19 0243 07/13/19 0552 07/14/19 0347  ALBUMIN 2.1* 2.0* 2.1* 2.2* 2.2*   No results for input(s): LIPASE,  AMYLASE in the last 168 hours. No results for input(s): AMMONIA in the last 168 hours. CBC: Recent Labs  Lab 07/10/19 0239 07/11/19 0454 07/12/19 0243 07/13/19 0552 07/14/19 0347 07/15/19 0236  WBC 9.0 7.7 8.5 8.1 9.5 9.3  NEUTROABS 6.3 4.9 6.0 4.9 6.6  --   HGB 7.4* 7.5* 7.5* 7.9* 8.4* 7.8*  HCT 22.9* 23.1* 23.2* 24.3* 25.4* 23.8*  MCV 90.5 91.7 90.3 91.0 90.4 89.8  PLT 167 161 165 161 172 165   Cardiac Enzymes: Recent Labs  Lab 07/10/19 0239 07/11/19 0454 07/12/19 0243 07/13/19 0552 07/14/19 0347  CKTOTAL 428* 202 105 56 45   BNP: BNP (last 3 results) No results for input(s): BNP in the last 8760 hours.  ProBNP (last 3 results) No results for input(s): PROBNP in the last 8760 hours.  CBG: Recent Labs  Lab 07/15/19 1206 07/15/19 1717 07/15/19 2127 07/16/19 0749 07/16/19 1233  GLUCAP 151* 116* 142* 120* 144*       Signed:  Rainey Rodger MD.  [facility] 07/16/2019, 1:56 PM

## 2019-07-16 NOTE — Progress Notes (Signed)
RN gave report about pt to Surveyor, quantity at [facility].

## 2019-07-16 NOTE — TOC Transition Note (Addendum)
Transition of Care [facility]) - CM/SW Discharge Note   Patient Details  Name: Beth Garrison MRN: [id number] Date of Birth: 02/24/1941  [facility] of [facility] CM/SW Contact:  Gelene Mink, [place] Phone Number: 07/16/2019, 1:27 PM   Clinical Narrative:     Patient will DC to: [place]  Anticipated DC date: 07/16/2019 Family notified: Yes Transport by: [facility] has been scheduled for 4:00pm *  Per MD patient ready for DC to . RN, patient, patient's family, and facility notified of DC. Discharge Summary and FL2 sent to facility.     RN to call report prior to discharge [phone]). The patient will go to room 127A.  DC packet on chart. Ambulance transport requested for patient.   CSW will sign off for now as social work intervention is no longer needed. Please consult [place] again if new needs arise.  Andrea Colglazier, LCSW-A [place]/Clinical Social Work Department Cell: [phone]     Final next level of care: [place] Barriers to Discharge: No Barriers Identified   Patient Goals and CMS Choice Patient states their goals for this hospitalization and ongoing recovery are:: Pt will go to [place] for rehab CMS Medicare.gov Compare Post Acute Care list provided to:: Patient Choice offered to / list presented to : Patient  Discharge Placement   Existing PASRR number confirmed : 07/12/19          Patient chooses bed at: Other - please specify in the comment section below:([facility]) Patient to be transferred to facility by: [facility] Name of family member notified: Sharyn Lull Patient and family notified of of transfer: 07/16/19  Discharge Plan and Services     Post Acute Care Choice: [facility]          DME Arranged: N/A DME Agency: NA       HH Arranged: NA HH Agency: NA        Social Determinants of Health (SDOH) Interventions     Readmission Risk Interventions No flowsheet data found.

## 2019-07-17 NOTE — Progress Notes (Signed)
   07/17/19 0018  Vitals  Temp 98.4 F (36.9 C)  Temp Source Oral  BP (!) 126/47  MAP (mmHg) 70  BP Location Left Arm  BP Method Automatic  Patient Position (if appropriate) Lying  Pulse Rate 79  Pulse Rate Source Monitor  Resp 18  Oxygen Therapy  SpO2 97 %  O2 Device Room Air  Pain Assessment  Pain Scale 0-10  Pain Score 0  MEWS Score  MEWS RR 0  MEWS Pulse 0  MEWS Systolic 0  MEWS LOC 0  MEWS Temp 0  MEWS Score 0  MEWS Score Color Green   Pt Discharged to [facility]. Pt  transported  By [facility] with all her belongings

## 2021-03-26 ENCOUNTER — Encounter (HOSPITAL_COMMUNITY): Payer: Self-pay

## 2021-03-26 ENCOUNTER — Inpatient Hospital Stay (HOSPITAL_COMMUNITY)
Admission: EM | Admit: 2021-03-26 | Discharge: 2021-03-28 | DRG: 872 | Disposition: A | Discharge status: Home Health | Payer: Medicare Other | Attending: Internal Medicine | Admitting: Internal Medicine

## 2021-03-26 ENCOUNTER — Other Ambulatory Visit: Payer: Self-pay

## 2021-03-26 ENCOUNTER — Emergency Department (HOSPITAL_COMMUNITY): Payer: Medicare Other

## 2021-03-26 DIAGNOSIS — A419 Sepsis, unspecified organism: Secondary | ICD-10-CM | POA: Diagnosis not present

## 2021-03-26 DIAGNOSIS — Z83438 Family history of other disorder of lipoprotein metabolism and other lipidemia: Secondary | ICD-10-CM

## 2021-03-26 DIAGNOSIS — E669 Obesity, unspecified: Secondary | ICD-10-CM | POA: Diagnosis present

## 2021-03-26 DIAGNOSIS — W1811XA Fall from or off toilet without subsequent striking against object, initial encounter: Secondary | ICD-10-CM | POA: Diagnosis present

## 2021-03-26 DIAGNOSIS — N390 Urinary tract infection, site not specified: Secondary | ICD-10-CM | POA: Diagnosis present

## 2021-03-26 DIAGNOSIS — R112 Nausea with vomiting, unspecified: Secondary | ICD-10-CM

## 2021-03-26 DIAGNOSIS — I10 Essential (primary) hypertension: Secondary | ICD-10-CM | POA: Diagnosis not present

## 2021-03-26 DIAGNOSIS — Z833 Family history of diabetes mellitus: Secondary | ICD-10-CM

## 2021-03-26 DIAGNOSIS — I129 Hypertensive chronic kidney disease with stage 1 through stage 4 chronic kidney disease, or unspecified chronic kidney disease: Secondary | ICD-10-CM | POA: Diagnosis present

## 2021-03-26 DIAGNOSIS — L89316 Pressure-induced deep tissue damage of right buttock: Secondary | ICD-10-CM | POA: Diagnosis present

## 2021-03-26 DIAGNOSIS — Y92012 Bathroom of single-family (private) house as the place of occurrence of the external cause: Secondary | ICD-10-CM | POA: Diagnosis not present

## 2021-03-26 DIAGNOSIS — E1122 Type 2 diabetes mellitus with diabetic chronic kidney disease: Secondary | ICD-10-CM | POA: Diagnosis present

## 2021-03-26 DIAGNOSIS — Z79899 Other long term (current) drug therapy: Secondary | ICD-10-CM

## 2021-03-26 DIAGNOSIS — Z6832 Body mass index (BMI) 32.0-32.9, adult: Secondary | ICD-10-CM | POA: Diagnosis not present

## 2021-03-26 DIAGNOSIS — K219 Gastro-esophageal reflux disease without esophagitis: Secondary | ICD-10-CM | POA: Diagnosis present

## 2021-03-26 DIAGNOSIS — S51012A Laceration without foreign body of left elbow, initial encounter: Secondary | ICD-10-CM | POA: Diagnosis present

## 2021-03-26 DIAGNOSIS — L89151 Pressure ulcer of sacral region, stage 1: Secondary | ICD-10-CM | POA: Diagnosis present

## 2021-03-26 DIAGNOSIS — E871 Hypo-osmolality and hyponatremia: Secondary | ICD-10-CM

## 2021-03-26 DIAGNOSIS — Z794 Long term (current) use of insulin: Secondary | ICD-10-CM

## 2021-03-26 DIAGNOSIS — Z7984 Long term (current) use of oral hypoglycemic drugs: Secondary | ICD-10-CM

## 2021-03-26 DIAGNOSIS — N183 Chronic kidney disease, stage 3 unspecified: Secondary | ICD-10-CM

## 2021-03-26 DIAGNOSIS — Z20822 Contact with and (suspected) exposure to covid-19: Secondary | ICD-10-CM | POA: Diagnosis present

## 2021-03-26 DIAGNOSIS — E118 Type 2 diabetes mellitus with unspecified complications: Secondary | ICD-10-CM | POA: Diagnosis not present

## 2021-03-26 DIAGNOSIS — N1832 Chronic kidney disease, stage 3b: Secondary | ICD-10-CM | POA: Diagnosis present

## 2021-03-26 DIAGNOSIS — A4150 Gram-negative sepsis, unspecified: Principal | ICD-10-CM | POA: Diagnosis present

## 2021-03-26 DIAGNOSIS — E785 Hyperlipidemia, unspecified: Secondary | ICD-10-CM | POA: Diagnosis present

## 2021-03-26 DIAGNOSIS — L8990 Pressure ulcer of unspecified site, unspecified stage: Secondary | ICD-10-CM | POA: Insufficient documentation

## 2021-03-26 LAB — PROTIME-INR
INR: 1.1 (ref 0.8–1.2)
Prothrombin Time: 14.3 seconds (ref 11.4–15.2)

## 2021-03-26 LAB — LACTIC ACID, PLASMA: Lactic Acid, Venous: 1 mmol/L (ref 0.5–1.9)

## 2021-03-26 LAB — CBC WITH DIFFERENTIAL/PLATELET
Abs Immature Granulocytes: NONE SEEN 10*3/uL (ref 0.00–0.07)
Band Neutrophils: 5 %
Basophils Relative: 1 %
Blasts: NONE SEEN %
Eosinophils Relative: 0 %
HCT: 37.6 % (ref 36.0–46.0)
Hemoglobin: 12.3 g/dL (ref 12.0–15.0)
Immature Granulocytes: NONE SEEN %
Lymphocytes Relative: 1 %
MCH: 29 pg (ref 26.0–34.0)
MCHC: 32.7 g/dL (ref 30.0–36.0)
MCV: 88.7 fL (ref 80.0–100.0)
Metamyelocytes Relative: NONE SEEN %
Monocytes Relative: 10 %
Myelocytes: NONE SEEN %
Neutrophils Relative %: 83 %
Platelets: 118 10*3/uL — ABNORMAL LOW (ref 150–400)
Promyelocytes Relative: NONE SEEN %
RBC Morphology: NORMAL
RBC: 4.24 MIL/uL (ref 3.87–5.11)
RDW: 12.4 % (ref 11.5–15.5)
WBC Morphology: NORMAL
WBC: 16.3 10*3/uL — ABNORMAL HIGH (ref 4.0–10.5)
nRBC: 0 % (ref 0.0–0.2)
nRBC: NONE SEEN /100 WBC

## 2021-03-26 LAB — URINALYSIS, ROUTINE W REFLEX MICROSCOPIC
Bilirubin Urine: NEGATIVE
Glucose, UA: NEGATIVE mg/dL
Ketones, ur: 5 mg/dL — AB
Nitrite: NEGATIVE
Protein, ur: 100 mg/dL — AB
Specific Gravity, Urine: 1.014 (ref 1.005–1.030)
pH: 8 (ref 5.0–8.0)

## 2021-03-26 LAB — COMPREHENSIVE METABOLIC PANEL
ALT: 19 U/L (ref 0–44)
AST: 28 U/L (ref 15–41)
Albumin: 3.4 g/dL — ABNORMAL LOW (ref 3.5–5.0)
Alkaline Phosphatase: 77 U/L (ref 38–126)
Anion gap: 10 (ref 5–15)
BUN: 27 mg/dL — ABNORMAL HIGH (ref 8–23)
CO2: 19 mmol/L — ABNORMAL LOW (ref 22–32)
Calcium: 8.4 mg/dL — ABNORMAL LOW (ref 8.9–10.3)
Chloride: 103 mmol/L (ref 98–111)
Creatinine, Ser: 1.41 mg/dL — ABNORMAL HIGH (ref 0.44–1.00)
GFR, Estimated: 38 mL/min — ABNORMAL LOW (ref >60)
Glucose, Bld: 171 mg/dL — ABNORMAL HIGH (ref 70–99)
Potassium: 4.1 mmol/L (ref 3.5–5.1)
Sodium: 132 mmol/L — ABNORMAL LOW (ref 135–145)
Total Bilirubin: 1.2 mg/dL (ref 0.3–1.2)
Total Protein: 6.6 g/dL (ref 6.5–8.1)

## 2021-03-26 LAB — RESP PANEL BY RT-PCR (FLU A&B, COVID) ARPGX2
Influenza A by PCR: NEGATIVE
Influenza B by PCR: NEGATIVE
SARS Coronavirus 2 by RT PCR: NEGATIVE

## 2021-03-26 LAB — APTT: aPTT: 28 seconds (ref 24–36)

## 2021-03-26 MED ORDER — OXYCODONE HCL 5 MG PO TABS: 5.0000 mg | ORAL_TABLET | ORAL | Status: DC | PRN

## 2021-03-26 MED ORDER — METRONIDAZOLE IN NACL 5-0.79 MG/ML-% IV SOLN
500.0000 mg | Freq: Once | INTRAVENOUS | Status: AC
  Administered 2021-03-26: 500 mg via INTRAVENOUS
  Filled 2021-03-26: qty 100

## 2021-03-26 MED ORDER — ACETAMINOPHEN 650 MG RE SUPP: 650.0000 mg | Freq: Four times a day (QID) | RECTAL | Status: DC | PRN

## 2021-03-26 MED ORDER — ENOXAPARIN SODIUM 40 MG/0.4ML ~~LOC~~ SOLN
40.0000 mg | SUBCUTANEOUS | Status: DC
  Administered 2021-03-27 – 2021-03-28 (×2): 40 mg via SUBCUTANEOUS
  Filled 2021-03-26 (×2): qty 0.4

## 2021-03-26 MED ORDER — ACETAMINOPHEN 500 MG PO TABS: 1000.0000 mg | ORAL_TABLET | Freq: Once | ORAL | Status: DC

## 2021-03-26 MED ORDER — ONDANSETRON HCL 4 MG PO TABS: 4.0000 mg | ORAL_TABLET | Freq: Four times a day (QID) | ORAL | Status: DC | PRN

## 2021-03-26 MED ORDER — LEVALBUTEROL HCL 0.63 MG/3ML IN NEBU: 0.6300 mg | INHALATION_SOLUTION | Freq: Four times a day (QID) | RESPIRATORY_TRACT | Status: DC | PRN

## 2021-03-26 MED ORDER — INSULIN ASPART 100 UNIT/ML ~~LOC~~ SOLN
0.0000 [IU] | Freq: Three times a day (TID) | SUBCUTANEOUS | Status: DC
  Filled 2021-03-26: qty 0.09

## 2021-03-26 MED ORDER — ACETAMINOPHEN 650 MG RE SUPP
650.0000 mg | Freq: Once | RECTAL | Status: AC
  Administered 2021-03-26: 650 mg via RECTAL
  Filled 2021-03-26: qty 1

## 2021-03-26 MED ORDER — SODIUM CHLORIDE 0.9 % IV SOLN
2.0000 g | Freq: Once | INTRAVENOUS | Status: AC
  Administered 2021-03-26: 2 g via INTRAVENOUS
  Filled 2021-03-26: qty 20

## 2021-03-26 MED ORDER — ONDANSETRON HCL 4 MG/2ML IJ SOLN: 4.0000 mg | Freq: Four times a day (QID) | INTRAMUSCULAR | Status: DC | PRN

## 2021-03-26 MED ORDER — LACTATED RINGERS IV BOLUS
1000.0000 mL | Freq: Once | INTRAVENOUS | Status: AC
  Administered 2021-03-26: 1000 mL via INTRAVENOUS

## 2021-03-26 MED ORDER — INSULIN ASPART 100 UNIT/ML ~~LOC~~ SOLN
0.0000 [IU] | Freq: Every day | SUBCUTANEOUS | Status: DC
  Filled 2021-03-26: qty 0.05

## 2021-03-26 MED ORDER — ACETAMINOPHEN 325 MG PO TABS: 650.0000 mg | ORAL_TABLET | Freq: Four times a day (QID) | ORAL | Status: DC | PRN

## 2021-03-26 MED ORDER — ONDANSETRON HCL 4 MG/2ML IJ SOLN
4.0000 mg | Freq: Once | INTRAMUSCULAR | Status: AC
  Administered 2021-03-26: 4 mg via INTRAVENOUS
  Filled 2021-03-26: qty 2

## 2021-03-26 NOTE — ED Triage Notes (Signed)
EMS reports N/V/D x 3 days, called out for fall due to increased weakness and decrease appetite x 3 days as well. Skin tears left arm, no LOC, no other obvious injuries, no blood thinners.  BP 90/30 HR 110 RR 30 Sp02 94 RA CBG 145  20ga L hand 8mg  Zofran 642ml NS enroute

## 2021-03-26 NOTE — ED Provider Notes (Addendum)
10:00 PM Care assumed at shift change from [place], PA-C.  In short, patient is a 80 year old female who presents to the emergency department for nausea, vomiting, diarrhea x3 days.  Has had inability to tolerate food or fluids.  Will require admission for sepsis.  Source at this time suspected to be urinary.  She has been covered broadly with abx.  11:19 PM Patient febrile.  Appears to not have received antipyretics.  These have been ordered.  We will also give second liter of IV fluids given recurrent tachycardia.  She is not hypotensive.  CBC has returned at 16.9.  Second lactate canceled as initial value was normal.  11:23 PM Patient reexamined at bedside.  She is ill-appearing.  Tachycardic into the 120s.  This is sinus rhythm.  Has an obese, nontender abdomen.  Do not feel emergent abdominal imaging is presently indicated.  C/o persistent nausea with minimal volume emesis in bag.  Zofran ordered.  Consult placed to hospitalist for admission.  11:34 PM Case discussed with MD Luna Fuse who will admit.   Antonietta Breach, PA-C 03/26/21 2336   .Critical Care Performed by: Antonietta Breach, PA-C Authorized by: Antonietta Breach, PA-C   Critical care provider statement:    Critical care time (minutes):  45   Critical care was necessary to treat or prevent imminent or life-threatening deterioration of the following conditions:  Sepsis   Critical care was time spent personally by me on the following activities:  Discussions with consultants, evaluation of patient's response to treatment, examination of patient, ordering and performing treatments and interventions, ordering and review of laboratory studies, ordering and review of radiographic studies, pulse oximetry, re-evaluation of patient's condition, obtaining history from patient or surrogate and review of old charts      Antonietta Breach, Hershal Coria 03/26/21 2337    Veryl Speak, MD 03/27/21 [phone]

## 2021-03-26 NOTE — ED Notes (Signed)
Patient transported to X-ray 

## 2021-03-26 NOTE — H&P (Incomplete)
History and Physical  Patient Name: Beth Garrison     XVQ:[id number]    DOB: [birth year]    DOA: 03/26/2021 PCP: Pcp, No  Patient coming from: Home  Chief Complaint: Intractable nausea and vomiting    HPI: Beth Garrison is a 80 y.o. female, with PMH of insulin-dependent type 2 diabetes, hypertension, iron deficiency anemia, dyslipidemia who presented to the ER on 03/26/2021 with intractable nausea and vomiting for the past few days.  Patient states for the past few days she has had intractable nausea, vomiting that has not improved.  She has had poor appetite.  She has vomited about 10 times today, nonbloody.  Inability tolerate much p.o. intake including liquids.  She did have a few episodes of watery diarrhea over the past 24 hours.  Subjective chills, no fever.  Decreased urinary output.  Denies any chest pain or dyspnea.  Hour prior to arrival patient became dizzy upon standing off the toilet and fell on her ear.  She denies any loss of consciousness or hitting her head.  Due to these complaints, EMS was contacted and she was brought to the hospital for further evaluation.    ED course: -Vitals on admission: Temperature 101.4 F, heart rate 105, respiratory rate 24, blood pressure 136/52, maintaining sats on room air -Labs on initial presentation: Sodium 132, potassium 4.1, bicarb 19, glucose 171, BUN 27, creatinine 1.4, calcium 8.4, WBC 16.3, hemoglobin 12.3, COVID-negative, UA: Leukocytes small, bacteria many, WBC 11-20 -Imaging obtained on admission: Chest x-ray unremarkable.  Hip x-ray unremarkable -In the ED the patient was given Rocephin, Flagyl, Zofran, 2 L of LR, and the hospitalist service was contacted for further evaluation and management.     ROS: A complete and thorough 12 point review of systems obtained, negative listed in HPI.     Past Medical History:  Diagnosis Date  . AKI (acute kidney injury) ([place]) 07/07/2019  . Diabetes mellitus without complication ([place])   . GERD  (gastroesophageal reflux disease)   . Hyperlipidemia   . Hypertension     Past Surgical History:  Procedure Laterality Date  . BIOPSY  07/10/2019   Procedure: BIOPSY;  Surgeon: Carol Ada, MD;  Location: [facility];  Service: Endoscopy;;  . ESOPHAGOGASTRODUODENOSCOPY (EGD) WITH PROPOFOL Left 07/10/2019   Procedure: ESOPHAGOGASTRODUODENOSCOPY (EGD) WITH PROPOFOL;  Surgeon: Carol Ada, MD;  Location: [facility];  Service: Endoscopy;  Laterality: Left;  . TONSILLECTOMY AND ADENOIDECTOMY      Social History: Patient lives at home with daughter.  The patient walks without assistance.  nonsmoker.  No Known Allergies  Family history: family history includes Diabetes in her brother, father, maternal grandmother, mother, and paternal grandmother; Hyperlipidemia in her mother.  Prior to Admission medications   Medication Sig Start Date End Date Taking? Authorizing Provider  amLODipine (NORVASC) 5 MG tablet Take 1 tablet (5 mg total) by mouth daily. 07/17/19  Yes Vasireddy, Grier Mitts, MD  FEROSUL 325 (65 Fe) MG tablet Take 325 mg by mouth daily. 02/21/21  Yes [provider]  glipiZIDE (GLUCOTROL XL) 2.5 MG 24 hr tablet Take 2.5 mg by mouth daily. 03/16/21  Yes [provider]  lisinopril (ZESTRIL) 20 MG tablet Take 20 mg by mouth daily.   Yes [provider]  pantoprazole (PROTONIX) 40 MG tablet Take 1 tablet (40 mg total) by mouth 2 (two) times daily. 07/16/19  Yes Vasireddy, Grier Mitts, MD  pravastatin (PRAVACHOL) 40 MG tablet Take 1 tablet (40 mg total) by mouth daily. 01/04/15  Yes Scarlette Calico  L, MD  TRESIBA FLEXTOUCH 200 UNIT/ML FlexTouch Pen Inject 10 Units into the skin daily. 11/29/20  Yes [provider]       Physical Exam: BP (!) 162/99   Pulse (!) 124   Temp (!) 101.4 F (38.6 C) (Oral)   Resp (!) 23   Ht 5\' 5"  (1.651 m)   Wt 87.8 kg   SpO2 99%   BMI 32.21 kg/m   General appearance: Well-developed, adult female, alert and in slight  distress secondary nausea.  Eyes: Anicteric, conjunctiva pink, lids and lashes normal. PERRL.    ENT: No nasal deformity, discharge, epistaxis.  Hearing intact. OP moist without lesions.   Neck: No neck masses.  Trachea midline.  No thyromegaly/tenderness. Lymph: No cervical or supraclavicular lymphadenopathy. Skin: Warm and dry.  No jaundice.  No suspicious rashes or lesions. Cardiac:  Tachycardic nl S1-S2, no murmurs appreciated.  No LE edema.  Radial and pedal pulses 2+ and symmetric. Respiratory: Normal respiratory rate and rhythm.  CTAB without rales or wheezes. Abdomen: Abdomen soft.  No tenderness with palpation. No ascites, distension, hepatosplenomegaly.   MSK: No deformities or effusions of the large joints of the upper or lower extremities bilaterally.  No cyanosis or clubbing. Neuro: Cranial nerves 2 through 12 grossly intact.  Sensation intact to light touch. Speech is fluent.  Marland Kitchen    Psych: Sensorium intact and responding to questions, attention normal.  Behavior appropriate.  Judgment and insight appear normal.    Labs on Admission:  I have personally reviewed following labs and imaging studies: CBC: Recent Labs  Lab 03/26/21 1956  WBC 16.3*  HGB 12.3  HCT 37.6  MCV 88.7  PLT 644*   Basic Metabolic Panel: Recent Labs  Lab 03/26/21 1956  NA 132*  K 4.1  CL 103  CO2 19*  GLUCOSE 171*  BUN 27*  CREATININE 1.41*  CALCIUM 8.4*   GFR: Estimated Creatinine Clearance: 36 mL/min (A) (by C-G formula based on SCr of 1.41 mg/dL (H)).  Liver Function Tests: Recent Labs  Lab 03/26/21 1956  AST 28  ALT 19  ALKPHOS 77  BILITOT 1.2  PROT 6.6  ALBUMIN 3.4*   No results for input(s): LIPASE, AMYLASE in the last 168 hours. No results for input(s): AMMONIA in the last 168 hours. Coagulation Profile: Recent Labs  Lab 03/26/21 1956  INR 1.1   Cardiac Enzymes: No results for input(s): CKTOTAL, CKMB, CKMBINDEX, TROPONINI in the last 168 hours. BNP (last 3  results) No results for input(s): PROBNP in the last 8760 hours. HbA1C: No results for input(s): HGBA1C in the last 72 hours. CBG: No results for input(s): GLUCAP in the last 168 hours. Lipid Profile: No results for input(s): CHOL, HDL, LDLCALC, TRIG, CHOLHDL, LDLDIRECT in the last 72 hours. Thyroid Function Tests: No results for input(s): TSH, T4TOTAL, FREET4, T3FREE, THYROIDAB in the last 72 hours. Anemia Panel: No results for input(s): VITAMINB12, FOLATE, FERRITIN, TIBC, IRON, RETICCTPCT in the last 72 hours.   Recent Results (from the past 240 hour(s))  Resp Panel by RT-PCR (Flu A&B, Covid) Nasopharyngeal Swab     Status: None   Collection Time: 03/26/21  8:31 PM   Specimen: Nasopharyngeal Swab; Nasopharyngeal(NP) swabs in vial transport medium  Result Value Ref Range Status   SARS Coronavirus 2 by RT PCR NEGATIVE NEGATIVE Final    Comment: (NOTE) SARS-CoV-2 target nucleic acids are NOT DETECTED.  The SARS-CoV-2 RNA is generally detectable in upper respiratory specimens during the acute phase of  infection. The lowest concentration of SARS-CoV-2 viral copies this assay can detect is 138 copies/mL. A negative result does not preclude SARS-Cov-2 infection and should not be used as the sole basis for treatment or other patient management decisions. A negative result may occur with  improper specimen collection/handling, submission of specimen other than nasopharyngeal swab, presence of viral mutation(s) within the areas targeted by this assay, and inadequate number of viral copies(<138 copies/mL). A negative result must be combined with clinical observations, patient history, and epidemiological information. The expected result is Negative.  Fact Sheet for Patients:  EntrepreneurPulse.com.au  Fact Sheet for Healthcare Providers:  IncredibleEmployment.be  This test is no t yet approved or cleared by the [place] FDA and  has been  authorized for detection and/or diagnosis of SARS-CoV-2 by FDA under an Emergency Use Authorization (EUA). This EUA will remain  in effect (meaning this test can be used) for the duration of the COVID-19 declaration under Section 564(b)(1) of the Act, 21 U.S.C.section 360bbb-3(b)(1), unless the authorization is terminated  or revoked sooner.       Influenza A by PCR NEGATIVE NEGATIVE Final   Influenza B by PCR NEGATIVE NEGATIVE Final    Comment: (NOTE) The Xpert Xpress SARS-CoV-2/FLU/RSV plus assay is intended as an aid in the diagnosis of influenza from Nasopharyngeal swab specimens and should not be used as a sole basis for treatment. Nasal washings and aspirates are unacceptable for Xpert Xpress SARS-CoV-2/FLU/RSV testing.  Fact Sheet for Patients: EntrepreneurPulse.com.au  Fact Sheet for Healthcare Providers: IncredibleEmployment.be  This test is not yet approved or cleared by the [place] FDA and has been authorized for detection and/or diagnosis of SARS-CoV-2 by FDA under an Emergency Use Authorization (EUA). This EUA will remain in effect (meaning this test can be used) for the duration of the COVID-19 declaration under Section 564(b)(1) of the Act, 21 U.S.C. section 360bbb-3(b)(1), unless the authorization is terminated or revoked.  Performed at [facility], [place] [address] [place], [place] [address]            Radiological Exams on Admission: Personally reviewed imaging which shows: Chest x-ray shows no acute processes. DG Chest Port 1 View  Result Date: 03/26/2021 CLINICAL DATA:  Nausea, vomiting, and diarrhea for 3 days. Weakness and decreased appetite. EXAM: PORTABLE CHEST 1 VIEW COMPARISON:  03/05/2010 FINDINGS: The heart size and mediastinal contours are within normal limits. Both lungs are clear. The visualized skeletal structures are unremarkable. Calcification of the aorta. IMPRESSION: No active  disease. Electronically Signed   By: Lucienne Capers M.D.   On: 03/26/2021 20:42   DG Hip Unilat W or Wo Pelvis 2-3 Views Left  Result Date: 03/26/2021 CLINICAL DATA:  Nausea, vomiting, and diarrhea for 3 days. Increased weakness and decreased appetite. Fell today. EXAM: DG HIP (WITH OR WITHOUT PELVIS) 2-3V LEFT COMPARISON:  None. FINDINGS: Examination is technically limited due to under penetrated technique. No evidence of acute fracture or dislocation of the pelvis or left hip. Degenerative changes are seen in the lower lumbar spine and both hips. Prominent vascular calcifications. IMPRESSION: No acute bony abnormalities.  Degenerative changes in the hips. Electronically Signed   By: Lucienne Capers M.D.   On: 03/26/2021 20:41         Assessment/Plan   1.  Sepsis, secondary to UTI -On admission patient febrile, tachycardic, with leukocytosis - UA on admission: Leukocytes small, bacteria many, WBC 11-20 - Received Rocephin and Flagyl in the ED.  We will continue  Rocephin - Blood and urine cultures pending - We will continue IV fluids until p.o. intake improves - Admitted with telemetry  2.  UTI -See further plans above  3.  Intractable nausea and vomiting -Suspect secondary to sepsis/UTI - Antiemetics as warranted - IV fluids until p.o. intake improves - See further plans above  4.  Insulin-dependent type 2 diabetes -Hemoglobin A1c ordered - Resume home long-acting insulin once home meds reconciled - Hold home glipizide for now - Glucose checks, sliding scale  ***  ***  ***  ***     DVT prophylaxis: Lovenox Code Status: Full Family Communication: None Disposition Plan: Anticipate discharge home when medically optimized Consults called: None Admission status: Inpatient with telemetry     Medical decision making: Patient seen at 11:49 PM on 03/26/2021.  The patient was discussed with ER provider.  What exists of the patient's chart was reviewed in depth and  summarized above.  Clinical condition: Fair.        Doran Heater [facility] Please page though [place] or Epic secure chat:  For password, contact charge nurse

## 2021-03-26 NOTE — H&P (Signed)
History and Physical  Patient Name: Beth Garrison     XVQ:[id number]    DOB: [birth year]    DOA: 03/26/2021 PCP: Pcp, No  Patient coming from: Home  Chief Complaint: Intractable nausea and vomiting    HPI: Beth Garrison is a 80 y.o. female, with PMH of insulin-dependent type 2 diabetes, hypertension, iron deficiency anemia, dyslipidemia who presented to the ER on 03/26/2021 with intractable nausea and vomiting for the past few days.  Patient states for the past few days she has had intractable nausea, vomiting that has not improved.  She has had poor appetite.  She has vomited about 10 times today, nonbloody.  Inability tolerate much p.o. intake including liquids.  She did have a few episodes of watery diarrhea over the past 24 hours.  Subjective chills, no fever.  Decreased urinary output.  Denies any chest pain or dyspnea.  Hour prior to arrival patient became dizzy upon standing off the toilet and fell on her ear.  She denies any loss of consciousness or hitting her head.  Due to these complaints, EMS was contacted and she was brought to the hospital for further evaluation.    ED course: -Vitals on admission: Temperature 101.4 F, heart rate 105, respiratory rate 24, blood pressure 136/52, maintaining sats on room air -Labs on initial presentation: Sodium 132, potassium 4.1, bicarb 19, glucose 171, BUN 27, creatinine 1.4, calcium 8.4, WBC 16.3, hemoglobin 12.3, COVID-negative, UA: Leukocytes small, bacteria many, WBC 11-20 -Imaging obtained on admission: Chest x-ray unremarkable.  Hip x-ray unremarkable -In the ED the patient was given Rocephin, Flagyl, Zofran, 2 L of LR, and the hospitalist service was contacted for further evaluation and management.     ROS: A complete and thorough 12 point review of systems obtained, negative listed in HPI.     Past Medical History:  Diagnosis Date  . AKI (acute kidney injury) ([place]) 07/07/2019  . Diabetes mellitus without complication ([place])   . GERD  (gastroesophageal reflux disease)   . Hyperlipidemia   . Hypertension     Past Surgical History:  Procedure Laterality Date  . BIOPSY  07/10/2019   Procedure: BIOPSY;  Surgeon: Carol Ada, MD;  Location: [facility];  Service: Endoscopy;;  . ESOPHAGOGASTRODUODENOSCOPY (EGD) WITH PROPOFOL Left 07/10/2019   Procedure: ESOPHAGOGASTRODUODENOSCOPY (EGD) WITH PROPOFOL;  Surgeon: Carol Ada, MD;  Location: [facility];  Service: Endoscopy;  Laterality: Left;  . TONSILLECTOMY AND ADENOIDECTOMY      Social History: Patient lives at home with daughter.  The patient walks without assistance.  nonsmoker.  No Known Allergies  Family history: family history includes Diabetes in her brother, father, maternal grandmother, mother, and paternal grandmother; Hyperlipidemia in her mother.  Prior to Admission medications   Medication Sig Start Date End Date Taking? Authorizing Provider  amLODipine (NORVASC) 5 MG tablet Take 1 tablet (5 mg total) by mouth daily. 07/17/19  Yes Vasireddy, Grier Mitts, MD  FEROSUL 325 (65 Fe) MG tablet Take 325 mg by mouth daily. 02/21/21  Yes [provider]  glipiZIDE (GLUCOTROL XL) 2.5 MG 24 hr tablet Take 2.5 mg by mouth daily. 03/16/21  Yes [provider]  lisinopril (ZESTRIL) 20 MG tablet Take 20 mg by mouth daily.   Yes [provider]  pantoprazole (PROTONIX) 40 MG tablet Take 1 tablet (40 mg total) by mouth 2 (two) times daily. 07/16/19  Yes Vasireddy, Grier Mitts, MD  pravastatin (PRAVACHOL) 40 MG tablet Take 1 tablet (40 mg total) by mouth daily. 01/04/15  Yes Scarlette Calico  L, MD  TRESIBA FLEXTOUCH 200 UNIT/ML FlexTouch Pen Inject 10 Units into the skin daily. 11/29/20  Yes [provider]       Physical Exam: BP (!) 162/99   Pulse (!) 124   Temp (!) 101.4 F (38.6 C) (Oral)   Resp (!) 23   Ht 5\' 5"  (1.651 m)   Wt 87.8 kg   SpO2 99%   BMI 32.21 kg/m   General appearance: Well-developed, adult female, alert and in slight  distress secondary nausea.  Eyes: Anicteric, conjunctiva pink, lids and lashes normal. PERRL.    ENT: No nasal deformity, discharge, epistaxis.  Hearing intact. OP moist without lesions.   Neck: No neck masses.  Trachea midline.  No thyromegaly/tenderness. Lymph: No cervical or supraclavicular lymphadenopathy. Skin: Warm and dry.  No jaundice.  No suspicious rashes or lesions. Cardiac:  Tachycardic nl S1-S2, no murmurs appreciated.  No LE edema.  Radial and pedal pulses 2+ and symmetric. Respiratory: Normal respiratory rate and rhythm.  CTAB without rales or wheezes. Abdomen: Abdomen soft.  No tenderness with palpation. No ascites, distension, hepatosplenomegaly.   MSK: No deformities or effusions of the large joints of the upper or lower extremities bilaterally.  No cyanosis or clubbing. Neuro: Cranial nerves 2 through 12 grossly intact.  Sensation intact to light touch. Speech is fluent.  Marland Kitchen    Psych: Sensorium intact and responding to questions, attention normal.  Behavior appropriate.  Judgment and insight appear normal.    Labs on Admission:  I have personally reviewed following labs and imaging studies: CBC: Recent Labs  Lab 03/26/21 1956  WBC 16.3*  HGB 12.3  HCT 37.6  MCV 88.7  PLT 315*   Basic Metabolic Panel: Recent Labs  Lab 03/26/21 1956  NA 132*  K 4.1  CL 103  CO2 19*  GLUCOSE 171*  BUN 27*  CREATININE 1.41*  CALCIUM 8.4*   GFR: Estimated Creatinine Clearance: 36 mL/min (A) (by C-G formula based on SCr of 1.41 mg/dL (H)).  Liver Function Tests: Recent Labs  Lab 03/26/21 1956  AST 28  ALT 19  ALKPHOS 77  BILITOT 1.2  PROT 6.6  ALBUMIN 3.4*   No results for input(s): LIPASE, AMYLASE in the last 168 hours. No results for input(s): AMMONIA in the last 168 hours. Coagulation Profile: Recent Labs  Lab 03/26/21 1956  INR 1.1   Cardiac Enzymes: No results for input(s): CKTOTAL, CKMB, CKMBINDEX, TROPONINI in the last 168 hours. BNP (last 3  results) No results for input(s): PROBNP in the last 8760 hours. HbA1C: No results for input(s): HGBA1C in the last 72 hours. CBG: No results for input(s): GLUCAP in the last 168 hours. Lipid Profile: No results for input(s): CHOL, HDL, LDLCALC, TRIG, CHOLHDL, LDLDIRECT in the last 72 hours. Thyroid Function Tests: No results for input(s): TSH, T4TOTAL, FREET4, T3FREE, THYROIDAB in the last 72 hours. Anemia Panel: No results for input(s): VITAMINB12, FOLATE, FERRITIN, TIBC, IRON, RETICCTPCT in the last 72 hours.   Recent Results (from the past 240 hour(s))  Resp Panel by RT-PCR (Flu A&B, Covid) Nasopharyngeal Swab     Status: None   Collection Time: 03/26/21  8:31 PM   Specimen: Nasopharyngeal Swab; Nasopharyngeal(NP) swabs in vial transport medium  Result Value Ref Range Status   SARS Coronavirus 2 by RT PCR NEGATIVE NEGATIVE Final    Comment: (NOTE) SARS-CoV-2 target nucleic acids are NOT DETECTED.  The SARS-CoV-2 RNA is generally detectable in upper respiratory specimens during the acute phase of  infection. The lowest concentration of SARS-CoV-2 viral copies this assay can detect is 138 copies/mL. A negative result does not preclude SARS-Cov-2 infection and should not be used as the sole basis for treatment or other patient management decisions. A negative result may occur with  improper specimen collection/handling, submission of specimen other than nasopharyngeal swab, presence of viral mutation(s) within the areas targeted by this assay, and inadequate number of viral copies(<138 copies/mL). A negative result must be combined with clinical observations, patient history, and epidemiological information. The expected result is Negative.  Fact Sheet for Patients:  EntrepreneurPulse.com.au  Fact Sheet for Healthcare Providers:  IncredibleEmployment.be  This test is no t yet approved or cleared by the [place] FDA and  has been  authorized for detection and/or diagnosis of SARS-CoV-2 by FDA under an Emergency Use Authorization (EUA). This EUA will remain  in effect (meaning this test can be used) for the duration of the COVID-19 declaration under Section 564(b)(1) of the Act, 21 U.S.C.section 360bbb-3(b)(1), unless the authorization is terminated  or revoked sooner.       Influenza A by PCR NEGATIVE NEGATIVE Final   Influenza B by PCR NEGATIVE NEGATIVE Final    Comment: (NOTE) The Xpert Xpress SARS-CoV-2/FLU/RSV plus assay is intended as an aid in the diagnosis of influenza from Nasopharyngeal swab specimens and should not be used as a sole basis for treatment. Nasal washings and aspirates are unacceptable for Xpert Xpress SARS-CoV-2/FLU/RSV testing.  Fact Sheet for Patients: EntrepreneurPulse.com.au  Fact Sheet for Healthcare Providers: IncredibleEmployment.be  This test is not yet approved or cleared by the [place] FDA and has been authorized for detection and/or diagnosis of SARS-CoV-2 by FDA under an Emergency Use Authorization (EUA). This EUA will remain in effect (meaning this test can be used) for the duration of the COVID-19 declaration under Section 564(b)(1) of the Act, 21 U.S.C. section 360bbb-3(b)(1), unless the authorization is terminated or revoked.  Performed at [facility], [place] [address] [place], [place] [address]            Radiological Exams on Admission: Personally reviewed imaging which shows: Chest x-ray shows no acute processes. DG Chest Port 1 View  Result Date: 03/26/2021 CLINICAL DATA:  Nausea, vomiting, and diarrhea for 3 days. Weakness and decreased appetite. EXAM: PORTABLE CHEST 1 VIEW COMPARISON:  03/05/2010 FINDINGS: The heart size and mediastinal contours are within normal limits. Both lungs are clear. The visualized skeletal structures are unremarkable. Calcification of the aorta. IMPRESSION: No active  disease. Electronically Signed   By: Lucienne Capers M.D.   On: 03/26/2021 20:42   DG Hip Unilat W or Wo Pelvis 2-3 Views Left  Result Date: 03/26/2021 CLINICAL DATA:  Nausea, vomiting, and diarrhea for 3 days. Increased weakness and decreased appetite. Fell today. EXAM: DG HIP (WITH OR WITHOUT PELVIS) 2-3V LEFT COMPARISON:  None. FINDINGS: Examination is technically limited due to under penetrated technique. No evidence of acute fracture or dislocation of the pelvis or left hip. Degenerative changes are seen in the lower lumbar spine and both hips. Prominent vascular calcifications. IMPRESSION: No acute bony abnormalities.  Degenerative changes in the hips. Electronically Signed   By: Lucienne Capers M.D.   On: 03/26/2021 20:41         Assessment/Plan   1.  Sepsis, secondary to UTI -On admission patient febrile, tachycardic, with leukocytosis - UA on admission: Leukocytes small, bacteria many, WBC 11-20 - Received Rocephin and Flagyl in the ED.  We will continue  Rocephin - Blood and urine cultures pending - We will continue IV fluids until p.o. intake improves - Admitted with telemetry  2.  UTI -See further plans above  3.  Intractable nausea and vomiting -Suspect secondary to sepsis/UTI - Antiemetics as warranted - IV fluids until p.o. intake improves - See further plans above  4.  Insulin-dependent type 2 diabetes -Hemoglobin A1c ordered - Resume home long-acting insulin once home meds reconciled - Hold home glipizide for now - Glucose checks, sliding scale  5.  Essential hypertension -We will hold off on restarting home medications due to sepsis and will monitor BP to see when restarting  6.  CKD 3B -Appears to have CKD 3B at baseline.  Currently near baseline. - Avoid NSAIDs - We will monitor closely  7.  Hyponatremia -Mild, suspect secondary to poor p.o. intake and thus high ADH - IV fluids as above - Follow-up labs ordered     DVT prophylaxis:  Lovenox Code Status: Full Family Communication: None Disposition Plan: Anticipate discharge home when medically optimized Consults called: None Admission status: Inpatient with telemetry     Medical decision making: Patient seen at 11:49 PM on 03/26/2021.  The patient was discussed with ER provider.  What exists of the patient's chart was reviewed in depth and summarized above.  Clinical condition: Fair.        Doran Heater [facility] Please page though [place] or Epic secure chat:  For password, contact charge nurse

## 2021-03-26 NOTE — ED Provider Notes (Signed)
[facility] DEPT Provider Note   CSN: [id number] Arrival date & time: 03/26/21  1812     History Chief Complaint  Patient presents with  . Nausea  . Emesis  . Diarrhea    Beth Garrison is a 80 y.o. female who presents with concern for 3 days of nausea, vomiting, and diarrhea.  She presents via EMS after fall today due to weakness and anorexia x3 days.  Patient states that she started suddenly with nausea and vomiting 3 days ago has had greater than 10 episodes of NBNB emesis daily, unable to tolerate anything p.o. including water.  She has had 3-4 episodes of watery diarrhea in the last 24 hours as well, denies hematochezia or melena.  She does endorse chills at home but denies any fevers.  Denies any chest pain or shortness of breath.  Patient states that today she got up off the toilet was dried blood per brief and became dizzy and she fell straight onto her buttocks.  She does have small skin tear to the left elbow but denies hitting her head, blurry vision, double vision, or loss of consciousness since that time.  I personally reviewed this patient's medical records.  She is a type II diabetic though there is no medications listed in her chart to treat this, history of hypertension hyperlipidemia as well.  HPI     Past Medical History:  Diagnosis Date  . AKI (acute kidney injury) ([place]) 07/07/2019  . Diabetes mellitus without complication ([place])   . GERD (gastroesophageal reflux disease)   . Hyperlipidemia   . Hypertension     Patient Active Problem List   Diagnosis Date Noted  . Renal failure 07/07/2019  . AKI (acute kidney injury) ([place]) 07/07/2019  . Hyperkalemia 07/07/2019  . Anemia 07/07/2019  . Melena 07/07/2019  . Routine general medical examination at a health care facility 03/23/2014  . Other screening mammogram 03/23/2014  . Type II diabetes mellitus with manifestations ([place]) 09/08/2013  . Hyperlipidemia with target LDL less than 100  09/08/2013  . HTN (hypertension) 09/08/2013    Past Surgical History:  Procedure Laterality Date  . BIOPSY  07/10/2019   Procedure: BIOPSY;  Surgeon: Carol Ada, MD;  Location: [facility];  Service: Endoscopy;;  . ESOPHAGOGASTRODUODENOSCOPY (EGD) WITH PROPOFOL Left 07/10/2019   Procedure: ESOPHAGOGASTRODUODENOSCOPY (EGD) WITH PROPOFOL;  Surgeon: Carol Ada, MD;  Location: [facility];  Service: Endoscopy;  Laterality: Left;  . TONSILLECTOMY AND ADENOIDECTOMY       OB History   No obstetric history on file.     Family History  Problem Relation Age of Onset  . Diabetes Mother   . Hyperlipidemia Mother   . Diabetes Father   . Diabetes Brother   . Diabetes Maternal Grandmother   . Diabetes Paternal Grandmother   . Cancer Neg Hx   . Stroke Neg Hx     Social History   Tobacco Use  . Smoking status: Never Smoker  . Smokeless tobacco: Never Used  Substance Use Topics  . Alcohol use: No  . Drug use: No    Home Medications Prior to Admission medications   Medication Sig Start Date End Date Taking? Authorizing Provider  amLODipine (NORVASC) 5 MG tablet Take 1 tablet (5 mg total) by mouth daily. 07/17/19   Monica Becton, MD  pantoprazole (PROTONIX) 40 MG tablet Take 1 tablet (40 mg total) by mouth 2 (two) times daily. 07/16/19   Monica Becton, MD  pravastatin (PRAVACHOL) 40 MG tablet Take  1 tablet (40 mg total) by mouth daily. Patient not taking: Reported on 07/07/2019 01/04/15   Janith Lima, MD    Allergies    Patient has no known allergies.  Review of Systems   Review of Systems  Constitutional: Positive for activity change, appetite change, chills, diaphoresis and fatigue. Negative for fever.  HENT: Negative.   Respiratory: Negative.   Cardiovascular: Negative.   Gastrointestinal: Positive for abdominal pain, diarrhea, nausea and vomiting. Negative for constipation.  Genitourinary: Positive for decreased urine volume. Negative for difficulty urinating,  dysuria, frequency, genital sores, hematuria, menstrual problem, pelvic pain, urgency, vaginal bleeding, vaginal discharge and vaginal pain.  Musculoskeletal: Positive for myalgias. Negative for arthralgias.  Skin: Negative.   Neurological: Negative for dizziness, syncope, facial asymmetry, speech difficulty, weakness, light-headedness and headaches.    Physical Exam Updated Vital Signs BP 138/69   Pulse (!) 102   Temp (!) 101.4 F (38.6 C) (Oral)   Resp (!) 29   Ht 5\' 5"  (1.651 m)   Wt 87.8 kg   SpO2 98%   BMI 32.21 kg/m   Physical Exam Vitals and nursing note reviewed.  Constitutional:      Appearance: She is obese. She is ill-appearing. She is not toxic-appearing.  HENT:     Head: Normocephalic and atraumatic.     Nose: Nose normal.     Mouth/Throat:     Mouth: Mucous membranes are moist.     Pharynx: Oropharynx is clear. Uvula midline. No oropharyngeal exudate, posterior oropharyngeal erythema or uvula swelling.     Tonsils: No tonsillar exudate.  Eyes:     General: Lids are normal. Vision grossly intact.        Right eye: No discharge.        Left eye: No discharge.     Extraocular Movements: Extraocular movements intact.     Conjunctiva/sclera: Conjunctivae normal.     Pupils: Pupils are equal, round, and reactive to light.  Neck:     Trachea: Trachea and phonation normal.  Cardiovascular:     Rate and Rhythm: Normal rate and regular rhythm.     Pulses: Normal pulses.     Heart sounds: Normal heart sounds. No murmur heard.   Pulmonary:     Effort: Pulmonary effort is normal. No tachypnea, bradypnea, accessory muscle usage, prolonged expiration or respiratory distress.     Breath sounds: Normal breath sounds. No wheezing or rales.  Chest:     Chest wall: No mass, lacerations, deformity, swelling, tenderness, crepitus or edema.  Abdominal:     General: Bowel sounds are normal. There is no distension.     Palpations: Abdomen is soft.     Tenderness: There is  generalized abdominal tenderness. There is no right CVA tenderness, left CVA tenderness, guarding or rebound.     Comments: Mild TTP of the abdomen throughout  Musculoskeletal:        General: No deformity.     Cervical back: Neck supple. No rigidity or crepitus. No pain with movement, spinous process tenderness or muscular tenderness.     Right hip: Normal.     Left hip: Tenderness and bony tenderness present. No deformity or crepitus. Decreased range of motion.     Right upper leg: Normal.     Left upper leg: Normal.     Right knee: Normal.     Left knee: Normal.     Right lower leg: Normal. No edema.     Left lower leg: Normal. No edema.  Right ankle: Normal.     Right Achilles Tendon: Normal.     Left ankle: Normal.     Left Achilles Tendon: Normal.     Right foot: Normal.     Left foot: Normal.       Legs:  Lymphadenopathy:     Cervical: No cervical adenopathy.  Skin:    General: Skin is warm and dry.  Neurological:     Mental Status: She is alert and oriented to person, place, and time. Mental status is at baseline.     Cranial Nerves: Cranial nerves are intact.     Sensory: Sensation is intact.     Motor: Motor function is intact.  Psychiatric:        Mood and Affect: Mood normal.     ED Results / Procedures / Treatments   Labs (all labs ordered are listed, but only abnormal results are displayed) Labs Reviewed  COMPREHENSIVE METABOLIC PANEL - Abnormal; Notable for the following components:      Result Value   Sodium 132 (*)    CO2 19 (*)    Glucose, Bld 171 (*)    BUN 27 (*)    Creatinine, Ser 1.41 (*)    Calcium 8.4 (*)    Albumin 3.4 (*)    GFR, Estimated 38 (*)    All other components within normal limits  URINALYSIS, ROUTINE W REFLEX MICROSCOPIC - Abnormal; Notable for the following components:   APPearance HAZY (*)    Hgb urine dipstick MODERATE (*)    Ketones, ur 5 (*)    Protein, ur 100 (*)    Leukocytes,Ua SMALL (*)    Bacteria, UA MANY (*)     All other components within normal limits  RESP PANEL BY RT-PCR (FLU A&B, COVID) ARPGX2  URINE CULTURE  CULTURE, BLOOD (ROUTINE X 2)  CULTURE, BLOOD (ROUTINE X 2)  LACTIC ACID, PLASMA  PROTIME-INR  APTT  LACTIC ACID, PLASMA  CBC WITH DIFFERENTIAL/PLATELET    EKG None  Radiology DG Chest Port 1 View  Result Date: 03/26/2021 CLINICAL DATA:  Nausea, vomiting, and diarrhea for 3 days. Weakness and decreased appetite. EXAM: PORTABLE CHEST 1 VIEW COMPARISON:  03/05/2010 FINDINGS: The heart size and mediastinal contours are within normal limits. Both lungs are clear. The visualized skeletal structures are unremarkable. Calcification of the aorta. IMPRESSION: No active disease. Electronically Signed   By: Lucienne Capers M.D.   On: 03/26/2021 20:42   DG Hip Unilat W or Wo Pelvis 2-3 Views Left  Result Date: 03/26/2021 CLINICAL DATA:  Nausea, vomiting, and diarrhea for 3 days. Increased weakness and decreased appetite. Fell today. EXAM: DG HIP (WITH OR WITHOUT PELVIS) 2-3V LEFT COMPARISON:  None. FINDINGS: Examination is technically limited due to under penetrated technique. No evidence of acute fracture or dislocation of the pelvis or left hip. Degenerative changes are seen in the lower lumbar spine and both hips. Prominent vascular calcifications. IMPRESSION: No acute bony abnormalities.  Degenerative changes in the hips. Electronically Signed   By: Lucienne Capers M.D.   On: 03/26/2021 20:41    Procedures Procedures   Medications Ordered in ED Medications  lactated ringers bolus 1,000 mL (0 mLs Intravenous Stopped 03/26/21 2234)  cefTRIAXone (ROCEPHIN) 2 g in sodium chloride 0.9 % 100 mL IVPB (0 g Intravenous Stopped 03/26/21 2049)    And  metroNIDAZOLE (FLAGYL) IVPB 500 mg (0 mg Intravenous Stopped 03/26/21 2234)    ED Course  I have reviewed the triage vital signs and  the nursing notes.  Pertinent labs & imaging results that were available during my care of the patient were  reviewed by me and considered in my medical decision making (see chart for details).    MDM Rules/Calculators/A&P                          80 year old female presents with 3 days of nausea, vomiting, diarrhea.  Fall today secondary to weakness.  Differential diagnosis for this patient includes but is not limited to acute gastroenteritis, COVID-19, influenza A/B, diverticulitis, appendicitis, cystitis/UTI/pyelonephritis, mesenteric ischemia, bowel obstruction.  Febrile to 101.4 F, tachycardic, tachypneic on intake.  No longer hypotensive after fluid bolus with EMS.  Cardiopulmonary exam is unremarkable, abdominal exam with mild generalized tenderness to palpation without guarding or rebound.  Patient with small skin tear to the left elbow, tenderness palpation over the left greater trochanter.  Will proceed with sepsis order set and plain film of the left hip with pelvis.  Additionally after discussion with attending physician we will proceed with IV antibiotics given for borderline septic presentation. CBC pending, CMP with baseline creatinine but elevated BUN to 27.  UA with moderate hemoglobin, proteinuria, ketonuria, leukocytes, many bacteria concerning for urinary tract infection.  Lactic acid is negative.  Chest x-ray negative for acute cardiopulmonary disease, plain film of the head negative for acute fracture or dislocation.  CBC pending at time of shift change.  Care of this patient signed out to oncoming ED provider, Antonietta Breach, PA-C at time of shift change.  All pertinent HPI, physical exam, and laboratory findings were discussed with her prior to my departure.  Patient will likely require admission to the hospital for generalized weakness, not tolerating p.o., and urinary tract infection.  I appreciate her collaboration in the care of this patient.  This chart was dictated using voice recognition software, Dragon. Despite the best efforts of this provider to proofread and correct errors,  errors may still occur which can change documentation meaning.  Final Clinical Impression(s) / ED Diagnoses Final diagnoses:  None    Rx / DC Orders ED Discharge Orders    None       Aura Dials 03/26/21 2234    Gareth Morgan, MD 03/29/21 1150

## 2021-03-26 NOTE — ED Notes (Signed)
Patient placed on purewick to retrieve urine sample. Patient unable to urinate at this time.

## 2021-03-27 DIAGNOSIS — E871 Hypo-osmolality and hyponatremia: Secondary | ICD-10-CM

## 2021-03-27 DIAGNOSIS — R112 Nausea with vomiting, unspecified: Secondary | ICD-10-CM

## 2021-03-27 DIAGNOSIS — L8990 Pressure ulcer of unspecified site, unspecified stage: Secondary | ICD-10-CM | POA: Insufficient documentation

## 2021-03-27 LAB — COMPREHENSIVE METABOLIC PANEL
ALT: 16 U/L (ref 0–44)
ALT: 18 U/L (ref 0–44)
AST: 17 U/L (ref 15–41)
AST: 20 U/L (ref 15–41)
Albumin: 2.8 g/dL — ABNORMAL LOW (ref 3.5–5.0)
Albumin: 3 g/dL — ABNORMAL LOW (ref 3.5–5.0)
Alkaline Phosphatase: 63 U/L (ref 38–126)
Alkaline Phosphatase: 68 U/L (ref 38–126)
Anion gap: 11 (ref 5–15)
Anion gap: 8 (ref 5–15)
BUN: 25 mg/dL — ABNORMAL HIGH (ref 8–23)
BUN: 29 mg/dL — ABNORMAL HIGH (ref 8–23)
CO2: 20 mmol/L — ABNORMAL LOW (ref 22–32)
CO2: 22 mmol/L (ref 22–32)
Calcium: 8.5 mg/dL — ABNORMAL LOW (ref 8.9–10.3)
Calcium: 8.4 mg/dL — ABNORMAL LOW (ref 8.9–10.3)
Chloride: 109 mmol/L (ref 98–111)
Chloride: 107 mmol/L (ref 98–111)
Creatinine, Ser: 1.3 mg/dL — ABNORMAL HIGH (ref 0.44–1.00)
Creatinine, Ser: 1.49 mg/dL — ABNORMAL HIGH (ref 0.44–1.00)
GFR, Estimated: 42 mL/min — ABNORMAL LOW (ref >60)
GFR, Estimated: 36 mL/min — ABNORMAL LOW (ref >60)
Glucose, Bld: 136 mg/dL — ABNORMAL HIGH (ref 70–99)
Glucose, Bld: 90 mg/dL (ref 70–99)
Potassium: 3.7 mmol/L (ref 3.5–5.1)
Potassium: 3.4 mmol/L — ABNORMAL LOW (ref 3.5–5.1)
Sodium: 140 mmol/L (ref 135–145)
Sodium: 137 mmol/L (ref 135–145)
Total Bilirubin: 0.5 mg/dL (ref 0.3–1.2)
Total Bilirubin: 0.4 mg/dL (ref 0.3–1.2)
Total Protein: 5.7 g/dL — ABNORMAL LOW (ref 6.5–8.1)
Total Protein: 6.1 g/dL — ABNORMAL LOW (ref 6.5–8.1)

## 2021-03-27 LAB — CBC
HCT: 33.4 % — ABNORMAL LOW (ref 36.0–46.0)
HCT: 36.2 % (ref 36.0–46.0)
Hemoglobin: 11 g/dL — ABNORMAL LOW (ref 12.0–15.0)
Hemoglobin: 11.7 g/dL — ABNORMAL LOW (ref 12.0–15.0)
MCH: 29.6 pg (ref 26.0–34.0)
MCH: 29.4 pg (ref 26.0–34.0)
MCHC: 32.9 g/dL (ref 30.0–36.0)
MCHC: 32.3 g/dL (ref 30.0–36.0)
MCV: 89.8 fL (ref 80.0–100.0)
MCV: 91 fL (ref 80.0–100.0)
Platelets: 112 10*3/uL — ABNORMAL LOW (ref 150–400)
Platelets: 111 10*3/uL — ABNORMAL LOW (ref 150–400)
RBC: 3.72 MIL/uL — ABNORMAL LOW (ref 3.87–5.11)
RBC: 3.98 MIL/uL (ref 3.87–5.11)
RDW: 12.4 % (ref 11.5–15.5)
RDW: 12.6 % (ref 11.5–15.5)
WBC: 15 10*3/uL — ABNORMAL HIGH (ref 4.0–10.5)
WBC: 9.9 10*3/uL (ref 4.0–10.5)
nRBC: 0 % (ref 0.0–0.2)
nRBC: 0 % (ref 0.0–0.2)

## 2021-03-27 LAB — GLUCOSE, CAPILLARY
Glucose-Capillary: 110 mg/dL — ABNORMAL HIGH (ref 70–99)
Glucose-Capillary: 99 mg/dL (ref 70–99)
Glucose-Capillary: 90 mg/dL (ref 70–99)
Glucose-Capillary: 100 mg/dL — ABNORMAL HIGH (ref 70–99)

## 2021-03-27 LAB — HEMOGLOBIN A1C
Hgb A1c MFr Bld: 6.4 % — ABNORMAL HIGH (ref 4.8–5.6)
Mean Plasma Glucose: 136.98 mg/dL

## 2021-03-27 LAB — MAGNESIUM: Magnesium: 1.6 mg/dL — ABNORMAL LOW (ref 1.7–2.4)

## 2021-03-27 LAB — PHOSPHORUS: Phosphorus: 2.8 mg/dL (ref 2.5–4.6)

## 2021-03-27 MED ORDER — INSULIN GLARGINE 100 UNIT/ML ~~LOC~~ SOLN
4.0000 [IU] | Freq: Two times a day (BID) | SUBCUTANEOUS | Status: DC
  Administered 2021-03-28: 4 [IU] via SUBCUTANEOUS
  Filled 2021-03-27: qty 0.04

## 2021-03-27 MED ORDER — PRAVASTATIN SODIUM 40 MG PO TABS
40.0000 mg | ORAL_TABLET | Freq: Every day | ORAL | Status: DC
  Administered 2021-03-27 – 2021-03-28 (×2): 40 mg via ORAL
  Filled 2021-03-27 (×2): qty 1

## 2021-03-27 MED ORDER — LACTATED RINGERS IV SOLN: INTRAVENOUS | Status: DC

## 2021-03-27 MED ORDER — SODIUM CHLORIDE 0.9 % IV SOLN
1.0000 g | INTRAVENOUS | Status: DC
  Administered 2021-03-27: 1 g via INTRAVENOUS
  Filled 2021-03-27: qty 1

## 2021-03-27 MED ORDER — INSULIN DEGLUDEC 200 UNIT/ML ~~LOC~~ SOPN: 4.0000 [IU] | PEN_INJECTOR | Freq: Two times a day (BID) | SUBCUTANEOUS | Status: DC

## 2021-03-27 MED ORDER — PANTOPRAZOLE SODIUM 40 MG PO TBEC
40.0000 mg | DELAYED_RELEASE_TABLET | Freq: Every day | ORAL | Status: DC
  Administered 2021-03-27 – 2021-03-28 (×2): 40 mg via ORAL
  Filled 2021-03-27 (×2): qty 1

## 2021-03-27 NOTE — Progress Notes (Signed)
PROGRESS NOTE    Beth Garrison  JGO:[id number] DOB: 1940/12/21 DOA: 03/26/2021 PCP: Pcp, No  Brief Narrative:hpi per dr Luna Fuse  Beth Garrison is a 80 y.o. female, with PMH of insulin-dependent type 2 diabetes, hypertension, iron deficiency anemia, dyslipidemia who presented to the ER on 03/26/2021 with intractable nausea and vomiting for the past few days.  Patient states for the past few days she has had intractable nausea, vomiting that has not improved.  She has had poor appetite.  She has vomited about 10 times today, nonbloody.  Inability tolerate much p.o. intake including liquids.  She did have a few episodes of watery diarrhea over the past 24 hours.  Subjective chills, no fever.  Decreased urinary output.  Denies any chest pain or dyspnea.  Hour prior to arrival patient became dizzy upon standing off the toilet and fell on her ear.  She denies any loss of consciousness or hitting her head.  Due to these complaints, EMS was contacted and she was brought to the hospital for further evaluation.    ED course: -Vitals on admission: Temperature 101.4 F, heart rate 105, respiratory rate 24, blood pressure 136/52, maintaining sats on room air -Labs on initial presentation: Sodium 132, potassium 4.1, bicarb 19, glucose 171, BUN 27, creatinine 1.4, calcium 8.4, WBC 16.3, hemoglobin 12.3, COVID-negative, UA: Leukocytes small, bacteria many, WBC 11-20 -Imaging obtained on admission: Chest x-ray unremarkable.  Hip x-ray unremarkable -In the ED the patient was given Rocephin, Flagyl, Zofran, 2 L of LR, and the hospitalist service was contacted for further evaluation and management.   Assessment & Plan:   Active Problems:   Type II diabetes mellitus with manifestations (HCC)   HTN (hypertension)   Sepsis (HCC)   Acute lower UTI   CKD (chronic kidney disease) stage 3, GFR 30-59 ml/min (HCC)   Hyponatremia   Intractable nausea and vomiting   Pressure injury of skin   #1 Sepsis secondary  to UTI  poa she met sepsis criteria on admit with tachypnea tachycardia and was febrile with leukocytosis  Still with soft blood pressure 117/50 tachycardic with a heart rate of 129 and febrile with leukocytosis. UA on admission consistent with UTI urine culture pending continue Rocephin.  Follow-up blood culture and urine culture. Continue IV fluids at a low rate patient still nauseous.  #2 type 2 diabetes-holding glipizide continue SSI and Lantus 4 units twice a day. CBG (last 3)  Recent Labs    03/27/21 0739  GLUCAP 110*    #3 essential hypertension BP still soft 117/50 continue to hold BP meds.  On Norvasc, lisinopril at home  #4 CKD stage IIIb-stable  #5 hyponatremia resolved with IV fluids.  Pressure Injury 03/27/21 Coccyx Medial Stage 1 -  Intact skin with non-blanchable redness of a localized area usually over a bony prominence. reddened (Active)  03/27/21 0045  Location: Coccyx  Location Orientation: Medial  Staging: Stage 1 -  Intact skin with non-blanchable redness of a localized area usually over a bony prominence.  Wound Description (Comments): reddened  Present on Admission: Yes     Pressure Injury 03/27/21 Buttocks Right Deep Tissue Pressure Injury - Purple or maroon localized area of discolored intact skin or blood-filled blister due to damage of underlying soft tissue from pressure and/or shear. purplish area to right buttocks (Active)  03/27/21 0045  Location: Buttocks  Location Orientation: Right  Staging: Deep Tissue Pressure Injury - Purple or maroon localized area of discolored intact skin or blood-filled blister due to damage  of underlying soft tissue from pressure and/or shear.  Wound Description (Comments): purplish area to right buttocks  Present on Admission: Yes    Estimated body mass index is 32.21 kg/m as calculated from the following:   Height as of this encounter: _0  (1.651 m).   Weight as of this encounter: 87.8 kg.  DVT  prophylaxis:lovenox Code Status full Family Communication: none at bedside  Disposition Plan:  Status is: Inpatient  Dispo: The patient is from: Home              Anticipated d/c is to: Home              Patient currently is not medically stable to d/c.   Difficult to place patient No   Consultants: none  Procedures: none Antimicrobials: rocephin  Subjective: She is resting in bed complaining of nausea and abdominal pain.   Objective: Vitals:   03/26/21 2300 03/26/21 2330 03/26/21 2355 03/27/21 0055  BP: (!) 162/99 (!) 132/105 (!) 145/56 (!) 117/50  Pulse: (!) 124 (!) 131 (!) 131 98  Resp: (!) 23 (!) 22 (!) 27 20  Temp:    100.2 F (37.9 C)  TempSrc:    Oral  SpO2: 99% 100% 93% 96%  Weight:      Height:        Intake/Output Summary (Last 24 hours) at 03/27/2021 1130 Last data filed at 03/27/2021 0533 Gross per 24 hour  Intake 342.1 ml  Output 1 ml  Net 341.1 ml   Filed Weights   03/26/21 1957  Weight: 87.8 kg    Examination:  General exam: Appears in mild distress due to nausea and abdominal pain Respiratory system: Clear to auscultation. Respiratory effort normal. Cardiovascular system: S1 & S2 heard, RRR. No JVD, murmurs, rubs, gallops or clicks. No pedal edema. Gastrointestinal system: Abdomen is nondistended, soft and suprapubic tender. No organomegaly or masses felt. Normal bowel sounds heard. Central nervous system: Alert and oriented. No focal neurological deficits. Extremities: Symmetric 5 x 5 power. Skin: No rashes, lesions or ulcers Psychiatry: Judgement and insight appear normal. Mood & affect appropriate.     Data Reviewed: I have personally reviewed following labs and imaging studies  CBC: Recent Labs  Lab 03/26/21 1956 03/27/21 0415  WBC 16.3* 15.0*  HGB 12.3 11.0*  HCT 37.6 33.4*  MCV 88.7 89.8  PLT 118* 595*   Basic Metabolic Panel: Recent Labs  Lab 03/26/21 1956 03/27/21 0415  NA 132* 140  K 4.1 3.7  CL 103 109  CO2 19* 20*   GLUCOSE 171* 136*  BUN 27* 25*  CREATININE 1.41* 1.30*  CALCIUM 8.4* 8.5*  MG  --  1.6*  PHOS  --  2.8   GFR: Estimated Creatinine Clearance: 39 mL/min (A) (by C-G formula based on SCr of 1.3 mg/dL (H)). Liver Function Tests: Recent Labs  Lab 03/26/21 1956 03/27/21 0415  AST 28 17  ALT 19 16  ALKPHOS 77 63  BILITOT 1.2 0.5  PROT 6.6 5.7*  ALBUMIN 3.4* 2.8*   No results for input(s): LIPASE, AMYLASE in the last 168 hours. No results for input(s): AMMONIA in the last 168 hours. Coagulation Profile: Recent Labs  Lab 03/26/21 1956  INR 1.1   Cardiac Enzymes: No results for input(s): CKTOTAL, CKMB, CKMBINDEX, TROPONINI in the last 168 hours. BNP (last 3 results) No results for input(s): PROBNP in the last 8760 hours. HbA1C: Recent Labs    03/27/21 0415  HGBA1C 6.4*   CBG: Recent  Labs  Lab 03/27/21 0739  GLUCAP 110*   Lipid Profile: No results for input(s): CHOL, HDL, LDLCALC, TRIG, CHOLHDL, LDLDIRECT in the last 72 hours. Thyroid Function Tests: No results for input(s): TSH, T4TOTAL, FREET4, T3FREE, THYROIDAB in the last 72 hours. Anemia Panel: No results for input(s): VITAMINB12, FOLATE, FERRITIN, TIBC, IRON, RETICCTPCT in the last 72 hours. Sepsis Labs: Recent Labs  Lab 03/26/21 2100  LATICACIDVEN 1.0    Recent Results (from the past 240 hour(s))  Resp Panel by RT-PCR (Flu A&B, Covid) Nasopharyngeal Swab     Status: None   Collection Time: 03/26/21  8:31 PM   Specimen: Nasopharyngeal Swab; Nasopharyngeal(NP) swabs in vial transport medium  Result Value Ref Range Status   SARS Coronavirus 2 by RT PCR NEGATIVE NEGATIVE Final    Comment: (NOTE) SARS-CoV-2 target nucleic acids are NOT DETECTED.  The SARS-CoV-2 RNA is generally detectable in upper respiratory specimens during the acute phase of infection. The lowest concentration of SARS-CoV-2 viral copies this assay can detect is 138 copies/mL. A negative result does not preclude SARS-Cov-2 infection  and should not be used as the sole basis for treatment or other patient management decisions. A negative result may occur with  improper specimen collection/handling, submission of specimen other than nasopharyngeal swab, presence of viral mutation(s) within the areas targeted by this assay, and inadequate number of viral copies(<138 copies/mL). A negative result must be combined with clinical observations, patient history, and epidemiological information. The expected result is Negative.  Fact Sheet for Patients:  EntrepreneurPulse.com.au  Fact Sheet for Healthcare Providers:  IncredibleEmployment.be  This test is no t yet approved or cleared by the [place] FDA and  has been authorized for detection and/or diagnosis of SARS-CoV-2 by FDA under an Emergency Use Authorization (EUA). This EUA will remain  in effect (meaning this test can be used) for the duration of the COVID-19 declaration under Section 564(b)(1) of the Act, 21 U.S.C.section 360bbb-3(b)(1), unless the authorization is terminated  or revoked sooner.       Influenza A by PCR NEGATIVE NEGATIVE Final   Influenza B by PCR NEGATIVE NEGATIVE Final    Comment: (NOTE) The Xpert Xpress SARS-CoV-2/FLU/RSV plus assay is intended as an aid in the diagnosis of influenza from Nasopharyngeal swab specimens and should not be used as a sole basis for treatment. Nasal washings and aspirates are unacceptable for Xpert Xpress SARS-CoV-2/FLU/RSV testing.  Fact Sheet for Patients: EntrepreneurPulse.com.au  Fact Sheet for Healthcare Providers: IncredibleEmployment.be  This test is not yet approved or cleared by the [place] FDA and has been authorized for detection and/or diagnosis of SARS-CoV-2 by FDA under an Emergency Use Authorization (EUA). This EUA will remain in effect (meaning this test can be used) for the duration of the COVID-19 declaration  under Section 564(b)(1) of the Act, 21 U.S.C. section 360bbb-3(b)(1), unless the authorization is terminated or revoked.  Performed at [facility], [place] [address] [place], [place] [address]          Radiology Studies: [facility] Chest Port 1 View  Result Date: 03/26/2021 CLINICAL DATA:  Nausea, vomiting, and diarrhea for 3 days. Weakness and decreased appetite. EXAM: PORTABLE CHEST 1 VIEW COMPARISON:  03/05/2010 FINDINGS: The heart size and mediastinal contours are within normal limits. Both lungs are clear. The visualized skeletal structures are unremarkable. Calcification of the aorta. IMPRESSION: No active disease. Electronically Signed   By: Lucienne Capers M.D.   On: 03/26/2021 20:42   DG Hip Unilat W or  Wo Pelvis 2-3 Views Left  Result Date: 03/26/2021 CLINICAL DATA:  Nausea, vomiting, and diarrhea for 3 days. Increased weakness and decreased appetite. Fell today. EXAM: DG HIP (WITH OR WITHOUT PELVIS) 2-3V LEFT COMPARISON:  None. FINDINGS: Examination is technically limited due to under penetrated technique. No evidence of acute fracture or dislocation of the pelvis or left hip. Degenerative changes are seen in the lower lumbar spine and both hips. Prominent vascular calcifications. IMPRESSION: No acute bony abnormalities.  Degenerative changes in the hips. Electronically Signed   By: Lucienne Capers M.D.   On: 03/26/2021 20:41        Scheduled Meds: . enoxaparin (LOVENOX) injection  40 mg Subcutaneous Q24H  . insulin aspart  0-5 Units Subcutaneous QHS  . insulin aspart  0-9 Units Subcutaneous TID WC  . [START ON 03/28/2021] insulin glargine  4 Units Subcutaneous BID  . pantoprazole  40 mg Oral Daily  . pravastatin  40 mg Oral Daily   Continuous Infusions: . cefTRIAXone (ROCEPHIN)  IV    . lactated ringers 75 mL/hr at 03/27/21 0059     LOS: 1 day    Georgette Shell, MD [facility]  03/27/2021, 11:30 AM

## 2021-03-27 NOTE — Plan of Care (Signed)

## 2021-03-27 NOTE — Plan of Care (Signed)

## 2021-03-27 NOTE — Progress Notes (Signed)
Pt transferred to 1425, report given to [place]. Pt updated dtg of transfer. Pt assessment and med completed. Pt in stable condition. Telemetry monitor switched and verified. SRP, RN

## 2021-03-28 LAB — COMPREHENSIVE METABOLIC PANEL
ALT: 20 U/L (ref 0–44)
AST: 22 U/L (ref 15–41)
Albumin: 2.8 g/dL — ABNORMAL LOW (ref 3.5–5.0)
Alkaline Phosphatase: 68 U/L (ref 38–126)
Anion gap: 10 (ref 5–15)
BUN: 25 mg/dL — ABNORMAL HIGH (ref 8–23)
CO2: 22 mmol/L (ref 22–32)
Calcium: 8.5 mg/dL — ABNORMAL LOW (ref 8.9–10.3)
Chloride: 108 mmol/L (ref 98–111)
Creatinine, Ser: 1.15 mg/dL — ABNORMAL HIGH (ref 0.44–1.00)
GFR, Estimated: 49 mL/min — ABNORMAL LOW (ref >60)
Glucose, Bld: 134 mg/dL — ABNORMAL HIGH (ref 70–99)
Potassium: 3.4 mmol/L — ABNORMAL LOW (ref 3.5–5.1)
Sodium: 140 mmol/L (ref 135–145)
Total Bilirubin: 0.4 mg/dL (ref 0.3–1.2)
Total Protein: 6 g/dL — ABNORMAL LOW (ref 6.5–8.1)

## 2021-03-28 LAB — CBC
HCT: 36.2 % (ref 36.0–46.0)
Hemoglobin: 11.9 g/dL — ABNORMAL LOW (ref 12.0–15.0)
MCH: 29.4 pg (ref 26.0–34.0)
MCHC: 32.9 g/dL (ref 30.0–36.0)
MCV: 89.4 fL (ref 80.0–100.0)
Platelets: 115 10*3/uL — ABNORMAL LOW (ref 150–400)
RBC: 4.05 MIL/uL (ref 3.87–5.11)
RDW: 12.3 % (ref 11.5–15.5)
WBC: 6.7 10*3/uL (ref 4.0–10.5)
nRBC: 0 % (ref 0.0–0.2)

## 2021-03-28 LAB — GLUCOSE, CAPILLARY
Glucose-Capillary: 81 mg/dL (ref 70–99)
Glucose-Capillary: 161 mg/dL — ABNORMAL HIGH (ref 70–99)
Glucose-Capillary: 141 mg/dL — ABNORMAL HIGH (ref 70–99)

## 2021-03-28 MED ORDER — POTASSIUM CHLORIDE CRYS ER 20 MEQ PO TBCR
40.0000 meq | EXTENDED_RELEASE_TABLET | Freq: Once | ORAL | Status: AC
  Administered 2021-03-28: 40 meq via ORAL
  Filled 2021-03-28: qty 2

## 2021-03-28 MED ORDER — CEFDINIR 300 MG PO CAPS
300.0000 mg | ORAL_CAPSULE | Freq: Two times a day (BID) | ORAL | Status: DC
  Administered 2021-03-28: 300 mg via ORAL
  Filled 2021-03-28: qty 1

## 2021-03-28 MED ORDER — CEFDINIR 300 MG PO CAPS: 300.0000 mg | ORAL_CAPSULE | Freq: Two times a day (BID) | ORAL | 0 refills | Status: DC

## 2021-03-28 NOTE — TOC Transition Note (Addendum)
Transition of Care [facility]) - CM/SW Discharge Note   Patient Details  Name: Beth Garrison MRN: [id number] Date of Birth: 1941/06/28  Transition of Care [facility]) CM/SW Contact:  Ross Ludwig, LCSW Phone Number: 03/28/2021, 3:54 PM   Clinical Narrative:     CSW was informed that patient will need home health services.  Patient was given choice and did not have a preference or agency.  Patient will be going home with home health through [facility].  CSW signing off please reconsult with any other social work needs, home health agency has been notified of planned discharge.  Patient is in agreement for services to be received through [facility].     Final next level of care: [place] Barriers to Discharge: Barriers Resolved   Patient Goals and CMS Choice Patient states their goals for this hospitalization and ongoing recovery are:: To return back home with home health services. CMS Medicare.gov Compare Post Acute Care list provided to:: Patient Choice offered to / list presented to : Patient  Discharge Placement  Patient discharging home with daughter.                     Discharge Plan and Services                          HH Arranged: PT [place] Agency: [place] Date [facility] Agency Contacted: 03/28/21 Time HH Agency Contacted: 1100 Representative spoke with at [place]: [place] Determinants of Health ([place]) Interventions     Readmission Risk Interventions No flowsheet data found.

## 2021-03-28 NOTE — Plan of Care (Signed)

## 2021-03-28 NOTE — Evaluation (Addendum)
Physical Therapy Evaluation Patient Details Name: Beth Garrison MRN: [id number] DOB: 09-Mar-1941 Today's Date: 03/28/2021   History of Present Illness  80 y.o. female who presented to the ER on 03/26/2021 with intractable nausea and vomiting. Dx of sepsis, UTI. Pt with PMH of insulin-dependent type 2 diabetes, hypertension, iron deficiency anemia, dyslipidemia  Clinical Impression  Pt ambulated 120' with RW, no loss of balance, distance limited by fatigue. Pt is ready to DC home from a PT standpoint. I spoke to pt's daughter Sharyn Lull (with whom pt lives)  via phone and recommended pt use RW to minimize fall risk at home for now (she walked without a device at baseline) as she is weak and deconditioned from several days of bedrest. HHPT recommended.     Follow Up Recommendations Home health PT    Equipment Recommendations  None recommended by PT    Recommendations for Other Services       Precautions / Restrictions Precautions Precautions: Fall Precaution Comments: near syncopal event on day of admission, no other falls in past 1 year Restrictions Weight Bearing Restrictions: No      Mobility  Bed Mobility Overal bed mobility: Modified Independent             General bed mobility comments: used rail    Transfers Overall transfer level: Needs assistance Equipment used: Rolling walker (2 wheeled) Transfers: Sit to/from Stand Sit to Stand: Min assist         General transfer comment: min A to power up, pt used momentum and required 3 attempts to successfully come fully upright  Ambulation/Gait Ambulation/Gait assistance: Supervision Gait Distance (Feet): 120 Feet Assistive device: Rolling walker (2 wheeled) Gait Pattern/deviations: Step-through pattern;Decreased stride length Gait velocity: WFL   General Gait Details: steady, no loss of balance, distance limited by fatigue, vital signs stable  Stairs            Wheelchair Mobility    Modified Rankin  (Stroke Patients Only)       Balance Overall balance assessment: Modified Independent                                           Pertinent Vitals/Pain Pain Assessment: No/denies pain    Home Living Family/patient expects to be discharged to:: Private residence Living Arrangements: Children Available Help at Discharge: Family;Available PRN/intermittently Type of Home: House Home Access: Stairs to enter Entrance Stairs-Rails: Psychiatric nurse of Steps: 5 Home Layout: One level Home Equipment: Walker - 2 wheels;Cane - single point      Prior Function Level of Independence: Independent         Comments: doesn't use AD, sponge bathes, lives with daughter who works and 39 year old grand child     Hand Dominance        Extremity/Trunk Assessment   Upper Extremity Assessment Upper Extremity Assessment: Overall WFL for tasks assessed    Lower Extremity Assessment Lower Extremity Assessment: Overall WFL for tasks assessed    Cervical / Trunk Assessment Cervical / Trunk Assessment: Normal  Communication   Communication: No difficulties  Cognition Arousal/Alertness: Awake/alert Behavior During Therapy: WFL for tasks assessed/performed Overall Cognitive Status: Within Functional Limits for tasks assessed  General Comments      Exercises     Assessment/Plan    PT Assessment All further PT needs can be met in the next venue of care  PT Problem List Decreased activity tolerance       PT Treatment Interventions      PT Goals (Current goals can be found in the Care Plan section)  Acute Rehab PT Goals PT Goal Formulation: All assessment and education complete, DC therapy    Frequency     Barriers to discharge        Co-evaluation               AM-PAC PT "6 Clicks" Mobility  Outcome Measure Help needed turning from your back to your side while in a flat bed  without using bedrails?: A Little Help needed moving from lying on your back to sitting on the side of a flat bed without using bedrails?: A Little Help needed moving to and from a bed to a chair (including a wheelchair)?: A Little Help needed standing up from a chair using your arms (e.g., wheelchair or bedside chair)?: A Little Help needed to walk in hospital room?: None Help needed climbing 3-5 steps with a railing? : A Little 6 Click Score: 19    End of Session Equipment Utilized During Treatment: Gait belt Activity Tolerance: Patient limited by fatigue Patient left: in chair;with call bell/phone within reach;with chair alarm set Nurse Communication: Mobility status PT Visit Diagnosis: Difficulty in walking, not elsewhere classified (R26.2)    Time: 7425-9563 PT Time Calculation (min) (ACUTE ONLY): 23 min   Charges:   PT Evaluation $PT Eval Low Complexity: 1 Low PT Treatments $Gait Training: 8-22 mins        Blondell Reveal Kistler PT 03/28/2021  Acute Rehabilitation Services Pager [phone] Office [phone]

## 2021-03-28 NOTE — Discharge Summary (Signed)
Physician Discharge Summary  Collier Monica JFH:[id number] DOB: 1941/01/08 DOA: 03/26/2021  PCP: Pcp, No  Admit date: 03/26/2021 Discharge date: 03/28/2021  Admitted From: Home Disposition: Home Recommendations for Outpatient Follow-up:  1. Follow up with PCP in 1-2 weeks 2. Please obtain BMP/CBC in one week   Home Health: Equipment/Devices none Discharge Condition: Stable CODE STATUS: Full code Diet recommendation: Cardiac Brief/Interim Summary:Beth Cooperis a 80 y.o.female, with PMH ofinsulin-dependent type 2 diabetes, hypertension, iron deficiency anemia, dyslipidemiawho presented to the ER on 4/23/2022with intractable nausea and vomiting for the past few days.  Patient states for the past few days she has had intractable nausea, vomiting that has not improved. She has had poor appetite. She has vomited about 10 times today, nonbloody. Inability tolerate much p.o. intake including liquids. She did have a few episodes of watery diarrhea over the past 24 hours. Subjective chills, no fever. Decreased urinary output. Denies any chest pain or dyspnea. Hour prior to arrival patient became dizzy upon standing off the toilet and fell on her ear. She denies any loss of consciousness or hitting her head. Due to these complaints, EMS was contacted and she was brought to the hospital for further evaluation.    ED course: -Vitals on admission:Temperature 101.4 F, heart rate 105, respiratory rate 24, blood pressure 136/52, maintaining sats on room air -Labs on initial presentation:Sodium 132, potassium 4.1, bicarb 19, glucose 171, BUN 27, creatinine 1.4, calcium 8.4, WBC 16.3, hemoglobin 12.3, COVID-negative, UA: Leukocytes small, bacteria many, WBC 11-20 -Imaging obtained on admission:Chest x-ray unremarkable. Hip x-ray unremarkable -In the ED the patient was givenRocephin, Flagyl, Zofran, 2 L of LR, and the hospitalist service was contacted for further evaluation and  management. Discharge Diagnoses:  Active Problems:   Type II diabetes mellitus with manifestations (HCC)   HTN (hypertension)   Sepsis (HCC)   Acute lower UTI   CKD (chronic kidney disease) stage 3, GFR 30-59 ml/min (HCC)   Hyponatremia   Intractable nausea and vomiting   Pressure injury of skin    #1 Sepsis secondary to UTI  poa she met sepsis criteria on admit with tachypnea tachycardia and was febrile with leukocytosis.  Urine culture grew gram-negative rods.  Blood cultures remain negative at the time of discharge.  She will be discharged on cefdinir 300 mg twice a day for 5 days.  She was able to tolerated diet.  She was treated with IV fluids and Rocephin.  #2 type 2 diabetes-continue glipizide and insulin. CBG (last 3)  Recent Labs (last 2 labs)      Recent Labs    03/27/21 0739  GLUCAP 110*      #3 essential hypertension continue Norvasc and lisinopril.  #4 CKD stage IIIb-stable  #5 hyponatremia resolved with IV fluids.  #6 pressure injury to coccyx present on admission   Pressure Injury 03/27/21 Coccyx Medial Stage 1 -  Intact skin with non-blanchable redness of a localized area usually over a bony prominence. reddened (Active)  03/27/21 0045  Location: Coccyx  Location Orientation: Medial  Staging: Stage 1 -  Intact skin with non-blanchable redness of a localized area usually over a bony prominence.  Wound Description (Comments): reddened  Present on Admission: Yes     Pressure Injury 03/27/21 Buttocks Right Deep Tissue Pressure Injury - Purple or maroon localized area of discolored intact skin or blood-filled blister due to damage of underlying soft tissue from pressure and/or shear. purplish area to right buttocks (Active)  03/27/21 0045  Location: Buttocks  Location  Orientation: Right  Staging: Deep Tissue Pressure Injury - Purple or maroon localized area of discolored intact skin or blood-filled blister due to damage of underlying soft tissue from  pressure and/or shear.  Wound Description (Comments): purplish area to right buttocks  Present on Admission: Yes    Estimated body mass index is 32.21 kg/m as calculated from the following:   Height as of this encounter: _0  (1.651 m).   Weight as of this encounter: 87.8 kg.  Discharge Instructions  Discharge Instructions    Diet - low sodium heart healthy   Complete by: As directed    Increase activity slowly   Complete by: As directed    No wound care   Complete by: As directed      Allergies as of 03/28/2021   No Known Allergies     Medication List    TAKE these medications   amLODipine 5 MG tablet Commonly known as: NORVASC Take 1 tablet (5 mg total) by mouth daily.   cefdinir 300 MG capsule Commonly known as: OMNICEF Take 1 capsule (300 mg total) by mouth 2 (two) times daily.   FeroSul 325 (65 FE) MG tablet Generic drug: ferrous sulfate Take 325 mg by mouth daily.   glipiZIDE 2.5 MG 24 hr tablet Commonly known as: GLUCOTROL XL Take 2.5 mg by mouth daily.   lisinopril 20 MG tablet Commonly known as: ZESTRIL Take 20 mg by mouth daily.   pantoprazole 40 MG tablet Commonly known as: PROTONIX Take 1 tablet (40 mg total) by mouth 2 (two) times daily.   pravastatin 40 MG tablet Commonly known as: PRAVACHOL Take 1 tablet (40 mg total) by mouth daily.   Tyler Aas FlexTouch 200 UNIT/ML FlexTouch Pen Generic drug: insulin degludec Inject 10 Units into the skin daily.       No Known Allergies  Consultations:none  Procedures/Studies: DG Chest Port 1 View  Result Date: 03/26/2021 CLINICAL DATA:  Nausea, vomiting, and diarrhea for 3 days. Weakness and decreased appetite. EXAM: PORTABLE CHEST 1 VIEW COMPARISON:  03/05/2010 FINDINGS: The heart size and mediastinal contours are within normal limits. Both lungs are clear. The visualized skeletal structures are unremarkable. Calcification of the aorta. IMPRESSION: No active disease. Electronically Signed   By:  Lucienne Capers M.D.   On: 03/26/2021 20:42   DG Hip Unilat W or Wo Pelvis 2-3 Views Left  Result Date: 03/26/2021 CLINICAL DATA:  Nausea, vomiting, and diarrhea for 3 days. Increased weakness and decreased appetite. Fell today. EXAM: DG HIP (WITH OR WITHOUT PELVIS) 2-3V LEFT COMPARISON:  None. FINDINGS: Examination is technically limited due to under penetrated technique. No evidence of acute fracture or dislocation of the pelvis or left hip. Degenerative changes are seen in the lower lumbar spine and both hips. Prominent vascular calcifications. IMPRESSION: No acute bony abnormalities.  Degenerative changes in the hips. Electronically Signed   By: Lucienne Capers M.D.   On: 03/26/2021 20:41    (Echo, Carotid, EGD, Colonoscopy, ERCP)    Subjective: Patient is resting in bed denies any nausea vomiting able to tolerate p.o. intake.  Waiting for physical therapy to see her denies any abdominal pain she lives at home with her daughter who was not able to pick her up today.  Discharge Exam: Vitals:   03/27/21 2131 03/28/21 0532  BP: (!) 138/54 (!) 141/51  Pulse: 86 79  Resp: 18 18  Temp: 97.7 F (36.5 C) 98.7 F (37.1 C)  SpO2: 95% 94%   Vitals:  03/27/21 0055 03/27/21 1308 03/27/21 2131 03/28/21 0532  BP: (!) 117/50 (!) 137/52 (!) 138/54 (!) 141/51  Pulse: 98 79 86 79  Resp: _0 Temp: 100.2 F (37.9 C) 99 F (37.2 C) 97.7 F (36.5 C) 98.7 F (37.1 C)  TempSrc: Oral Oral Oral Oral  SpO2: 96% 98% 95% 94%  Weight:      Height:        General: Pt is alert, awake, not in acute distress Cardiovascular: RRR, S1/S2 +, no rubs, no gallops Respiratory: CTA bilaterally, no wheezing, no rhonchi Abdominal: Soft, NT, ND, bowel sounds + Extremities: no edema, no cyanosis    The results of significant diagnostics from this hospitalization (including imaging, microbiology, ancillary and laboratory) are listed below for reference.     Microbiology: Recent Results (from the  past 240 hour(s))  Blood Culture (routine x 2)     Status: None (Preliminary result)   Collection Time: 03/26/21  7:57 PM   Specimen: BLOOD  Result Value Ref Range Status   Specimen Description   Final    BLOOD LEFT ARM Performed at [facility] [address], [place], [place] [address]    Special Requests   Final    BOTTLES DRAWN AEROBIC AND ANAEROBIC Blood Culture adequate volume Performed at [facility] [address] [place], [place] [address]    Culture   Final    NO GROWTH 1 DAY Performed at [facility], [place] [address] [place], [place] [address]    Report Status PENDING  Incomplete  Blood Culture (routine x 2)     Status: None (Preliminary result)   Collection Time: 03/26/21  7:57 PM   Specimen: BLOOD LEFT HAND  Result Value Ref Range Status   Specimen Description   Final    BLOOD LEFT HAND Performed at [facility] [address] [place], [place] [address]    Special Requests   Final    BOTTLES DRAWN AEROBIC AND ANAEROBIC Blood Culture adequate volume Performed at [facility] [address], [place], [place] [address]    Culture   Final    NO GROWTH 1 DAY Performed at [facility], [place] [address] [place], [place] [address]    Report Status PENDING  Incomplete  Resp Panel by RT-PCR (Flu A&B, Covid) Nasopharyngeal Swab     Status: None   Collection Time: 03/26/21  8:31 PM   Specimen: Nasopharyngeal Swab; Nasopharyngeal(NP) swabs in vial transport medium  Result Value Ref Range Status   SARS Coronavirus 2 by RT PCR NEGATIVE NEGATIVE Final    Comment: (NOTE) SARS-CoV-2 target nucleic acids are NOT DETECTED.  The SARS-CoV-2 RNA is generally detectable in upper respiratory specimens during the acute phase of infection. The lowest concentration of SARS-CoV-2 viral copies this assay can detect is 138 copies/mL. A negative result does not preclude SARS-Cov-2 infection and  should not be used as the sole basis for treatment or other patient management decisions. A negative result may occur with  improper specimen collection/handling, submission of specimen other than nasopharyngeal swab, presence of viral mutation(s) within the areas targeted by this assay, and inadequate number of viral copies(<138 copies/mL). A negative result must be combined with clinical observations, patient history, and epidemiological information. The expected result is Negative.  Fact Sheet for Patients:  EntrepreneurPulse.com.au  Fact Sheet for Healthcare Providers:  IncredibleEmployment.be  This test is no t yet approved or cleared by  the [employer] and  has been authorized for detection and/or diagnosis of SARS-CoV-2 by FDA under an Emergency Use Authorization (EUA). This EUA will remain  in effect (meaning this test can be used) for the duration of the COVID-19 declaration under Section 564(b)(1) of the Act, 21 U.S.C.section 360bbb-3(b)(1), unless the authorization is terminated  or revoked sooner.       Influenza A by PCR NEGATIVE NEGATIVE Final   Influenza B by PCR NEGATIVE NEGATIVE Final    Comment: (NOTE) The Xpert Xpress SARS-CoV-2/FLU/RSV plus assay is intended as an aid in the diagnosis of influenza from Nasopharyngeal swab specimens and should not be used as a sole basis for treatment. Nasal washings and aspirates are unacceptable for Xpert Xpress SARS-CoV-2/FLU/RSV testing.  Fact Sheet for Patients: EntrepreneurPulse.com.au  Fact Sheet for Healthcare Providers: IncredibleEmployment.be  This test is not yet approved or cleared by the [place] FDA and has been authorized for detection and/or diagnosis of SARS-CoV-2 by FDA under an Emergency Use Authorization (EUA). This EUA will remain in effect (meaning this test can be used) for the duration of the COVID-19 declaration  under Section 564(b)(1) of the Act, 21 U.S.C. section 360bbb-3(b)(1), unless the authorization is terminated or revoked.  Performed at [facility], [place] [address], [place], [place] [address]   Urine culture     Status: Abnormal (Preliminary result)   Collection Time: 03/26/21  8:51 PM   Specimen: In/Out Cath Urine  Result Value Ref Range Status   Specimen Description   Final    IN/OUT CATH URINE Performed at [facility] [address], [place], [place] [address]    Special Requests   Final    NONE Performed at [facility], [place] [address], [place], [place] [address]    Culture (A)  Final    >=100,000 COLONIES/mL GRAM NEGATIVE RODS CULTURE REINCUBATED FOR BETTER GROWTH SUSCEPTIBILITIES TO FOLLOW Performed at [facility], [place] [address] [place], [place] [address]    Report Status PENDING  Incomplete     Labs: BNP (last 3 results) No results for input(s): BNP in the last 8760 hours. Basic Metabolic Panel: Recent Labs  Lab 03/26/21 1956 03/27/21 0415 03/27/21 1330  NA 132* 140 137  K 4.1 3.7 3.4*  CL 103 109 107  CO2 19* 20* 22  GLUCOSE 171* 136* 90  BUN 27* 25* 29*  CREATININE 1.41* 1.30* 1.49*  CALCIUM 8.4* 8.5* 8.4*  MG  --  1.6*  --   PHOS  --  2.8  --    Liver Function Tests: Recent Labs  Lab 03/26/21 1956 03/27/21 0415 03/27/21 1330  AST _0 ALT _1 ALKPHOS 77 63 68  BILITOT 1.2 0.5 0.4  PROT 6.6 5.7* 6.1*  ALBUMIN 3.4* 2.8* 3.0*   No results for input(s): LIPASE, AMYLASE in the last 168 hours. No results for input(s): AMMONIA in the last 168 hours. CBC: Recent Labs  Lab 03/26/21 1956 03/27/21 0415 03/27/21 1330  WBC 16.3* 15.0* 9.9  HGB 12.3 11.0* 11.7*  HCT 37.6 33.4* 36.2  MCV 88.7 89.8 91.0  PLT 118* 112* 111*   Cardiac Enzymes: No results for input(s): CKTOTAL, CKMB, CKMBINDEX, TROPONINI in the last 168 hours. BNP: Invalid input(s): POCBNP CBG: Recent  Labs  Lab 03/27/21 0739 03/27/21 1304 03/27/21 1810 03/27/21 2132 03/28/21 0730  GLUCAP 110* 99 90 100* 81   D-Dimer No results for input(s): DDIMER in  the last 72 hours. Hgb A1c Recent Labs    03/27/21 0415  HGBA1C 6.4*   Lipid Profile No results for input(s): CHOL, HDL, LDLCALC, TRIG, CHOLHDL, LDLDIRECT in the last 72 hours. Thyroid function studies No results for input(s): TSH, T4TOTAL, T3FREE, THYROIDAB in the last 72 hours.  Invalid input(s): FREET3 Anemia work up No results for input(s): VITAMINB12, FOLATE, FERRITIN, TIBC, IRON, RETICCTPCT in the last 72 hours. Urinalysis    Component Value Date/Time   COLORURINE YELLOW 03/26/2021 2051   APPEARANCEUR HAZY (A) 03/26/2021 2051   LABSPEC 1.014 03/26/2021 2051   PHURINE 8.0 03/26/2021 2051   GLUCOSEU NEGATIVE 03/26/2021 2051   GLUCOSEU NEGATIVE 12/31/2014 1145   HGBUR MODERATE (A) 03/26/2021 2051   BILIRUBINUR NEGATIVE 03/26/2021 2051   KETONESUR 5 (A) 03/26/2021 2051   PROTEINUR 100 (A) 03/26/2021 2051   UROBILINOGEN 0.2 12/31/2014 1145   NITRITE NEGATIVE 03/26/2021 2051   LEUKOCYTESUR SMALL (A) 03/26/2021 2051   Sepsis Labs Invalid input(s): PROCALCITONIN,  WBC,  LACTICIDVEN Microbiology Recent Results (from the past 240 hour(s))  Blood Culture (routine x 2)     Status: None (Preliminary result)   Collection Time: 03/26/21  7:57 PM   Specimen: BLOOD  Result Value Ref Range Status   Specimen Description   Final    BLOOD LEFT ARM Performed at [facility], [place] [address], [place], [place] [address]    Special Requests   Final    BOTTLES DRAWN AEROBIC AND ANAEROBIC Blood Culture adequate volume Performed at [facility] [address] [place], [place] [address]    Culture   Final    NO GROWTH 1 DAY Performed at [facility], [place] [address] [place], [place] [address]    Report Status PENDING  Incomplete  Blood Culture (routine x 2)     Status: None  (Preliminary result)   Collection Time: 03/26/21  7:57 PM   Specimen: BLOOD LEFT HAND  Result Value Ref Range Status   Specimen Description   Final    BLOOD LEFT HAND Performed at [facility] [address], [place], [place] [address]    Special Requests   Final    BOTTLES DRAWN AEROBIC AND ANAEROBIC Blood Culture adequate volume Performed at [facility] [address] [place], [place] [address]    Culture   Final    NO GROWTH 1 DAY Performed at [facility], [place] [address] [place], [place] [address]    Report Status PENDING  Incomplete  Resp Panel by RT-PCR (Flu A&B, Covid) Nasopharyngeal Swab     Status: None   Collection Time: 03/26/21  8:31 PM   Specimen: Nasopharyngeal Swab; Nasopharyngeal(NP) swabs in vial transport medium  Result Value Ref Range Status   SARS Coronavirus 2 by RT PCR NEGATIVE NEGATIVE Final    Comment: (NOTE) SARS-CoV-2 target nucleic acids are NOT DETECTED.  The SARS-CoV-2 RNA is generally detectable in upper respiratory specimens during the acute phase of infection. The lowest concentration of SARS-CoV-2 viral copies this assay can detect is 138 copies/mL. A negative result does not preclude SARS-Cov-2 infection and should not be used as the sole basis for treatment or other patient management decisions. A negative result may occur with  improper specimen collection/handling, submission of specimen other than nasopharyngeal swab, presence of viral mutation(s) within the areas targeted by this assay, and inadequate number of viral copies(<138 copies/mL). A negative result must be combined with clinical observations,  patient history, and epidemiological information. The expected result is Negative.  Fact Sheet for Patients:  EntrepreneurPulse.com.au  Fact Sheet for Healthcare Providers:  IncredibleEmployment.be  This test is no t yet approved or cleared by the [place] FDA and  has been authorized for detection and/or diagnosis of SARS-CoV-2 by FDA under an Emergency Use Authorization (EUA). This EUA will remain  in effect (meaning this test can be used) for the duration of the COVID-19 declaration under Section 564(b)(1) of the Act, 21 U.S.C.section 360bbb-3(b)(1), unless the authorization is terminated  or revoked sooner.       Influenza A by PCR NEGATIVE NEGATIVE Final   Influenza B by PCR NEGATIVE NEGATIVE Final    Comment: (NOTE) The Xpert Xpress SARS-CoV-2/FLU/RSV plus assay is intended as an aid in the diagnosis of influenza from Nasopharyngeal swab specimens and should not be used as a sole basis for treatment. Nasal washings and aspirates are unacceptable for Xpert Xpress SARS-CoV-2/FLU/RSV testing.  Fact Sheet for Patients: EntrepreneurPulse.com.au  Fact Sheet for Healthcare Providers: IncredibleEmployment.be  This test is not yet approved or cleared by the [place] FDA and has been authorized for detection and/or diagnosis of SARS-CoV-2 by FDA under an Emergency Use Authorization (EUA). This EUA will remain in effect (meaning this test can be used) for the duration of the COVID-19 declaration under Section 564(b)(1) of the Act, 21 U.S.C. section 360bbb-3(b)(1), unless the authorization is terminated or revoked.  Performed at [facility], [place] [address] [place], [place] [address]   Urine culture     Status: Abnormal (Preliminary result)   Collection Time: 03/26/21  8:51 PM   Specimen: In/Out Cath Urine  Result Value Ref Range Status   Specimen Description   Final    IN/OUT CATH URINE Performed at [facility] [address] [place], [place] [address]    Special Requests   Final    NONE Performed at [facility], [place] [address], [place], [place] [address]    Culture (A)  Final    >=100,000 COLONIES/mL GRAM NEGATIVE  RODS CULTURE REINCUBATED FOR BETTER GROWTH SUSCEPTIBILITIES TO FOLLOW Performed at [facility], [place] [address], [place], [place] [address]    Report Status PENDING  Incomplete     Time coordinating discharge: 39 minutes  SIGNED:   Georgette Shell, MD  [facility] 03/28/2021, 10:21 AM

## 2021-03-29 LAB — URINE CULTURE: Culture: >=100000 — AB

## 2021-04-01 LAB — CULTURE, BLOOD (ROUTINE X 2)
Culture: NO GROWTH
Culture: NO GROWTH
Special Requests: ADEQUATE
Special Requests: ADEQUATE

## 2021-10-11 IMAGING — CR DG HIP (WITH OR WITHOUT PELVIS) 2-3V*L*
3 series · 3 of 3 positions shown · non-contrast
Comparison: None.

CLINICAL DATA: Nausea, vomiting, and diarrhea for 3 days. Increased
weakness and decreased appetite. Fell today.

EXAM:
DG HIP (WITH OR WITHOUT PELVIS) 2-3V LEFT

[x pelvis]
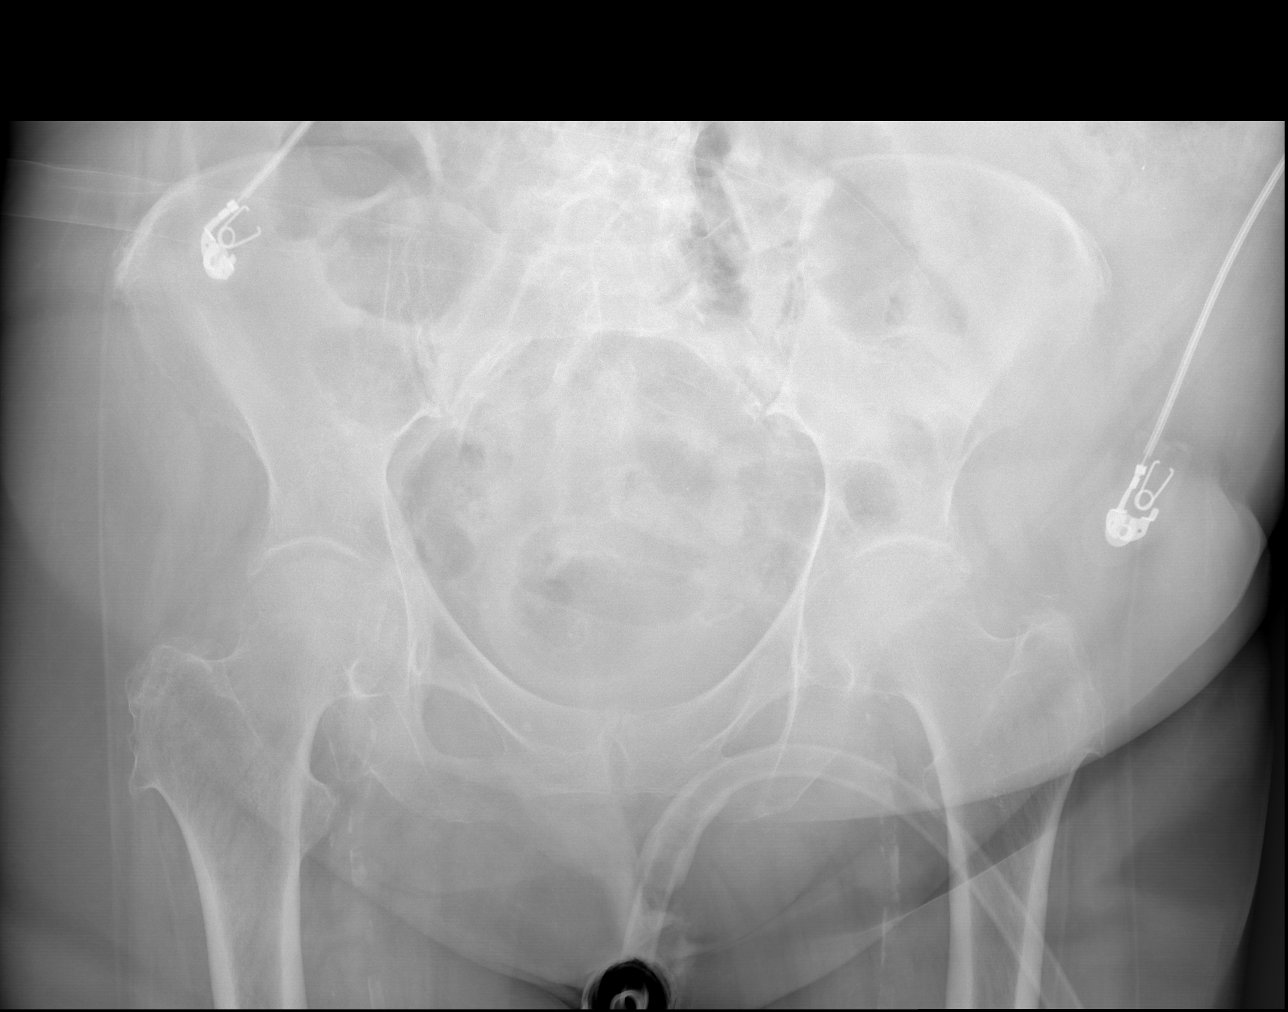

[x hip ap left]
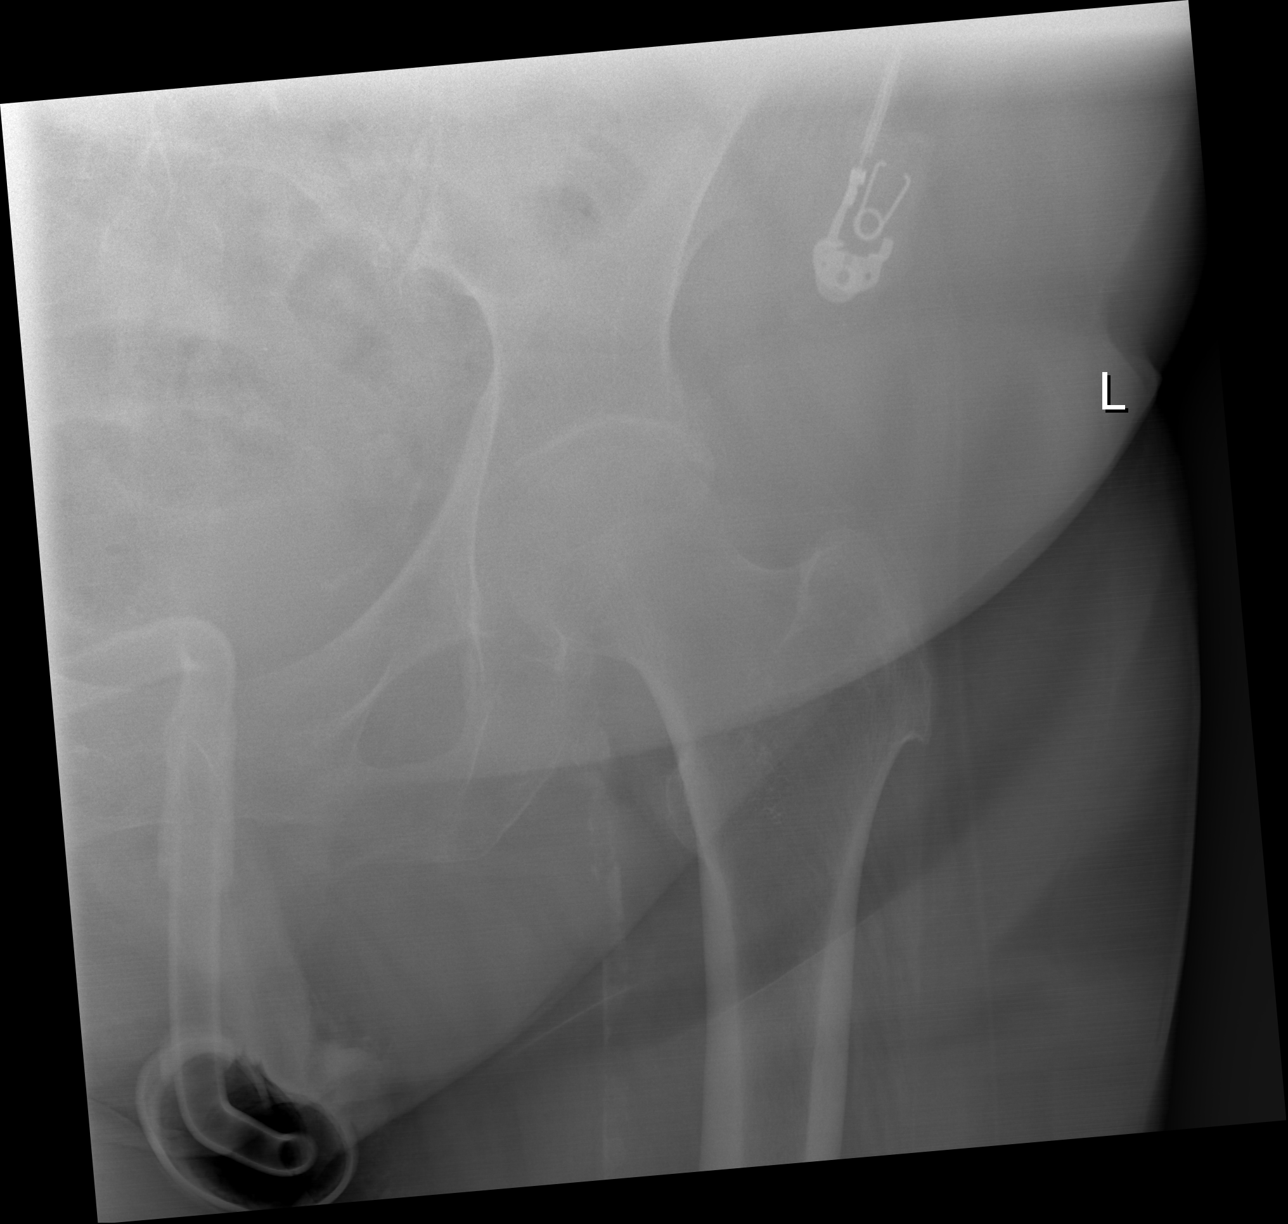

[x hip lat left]
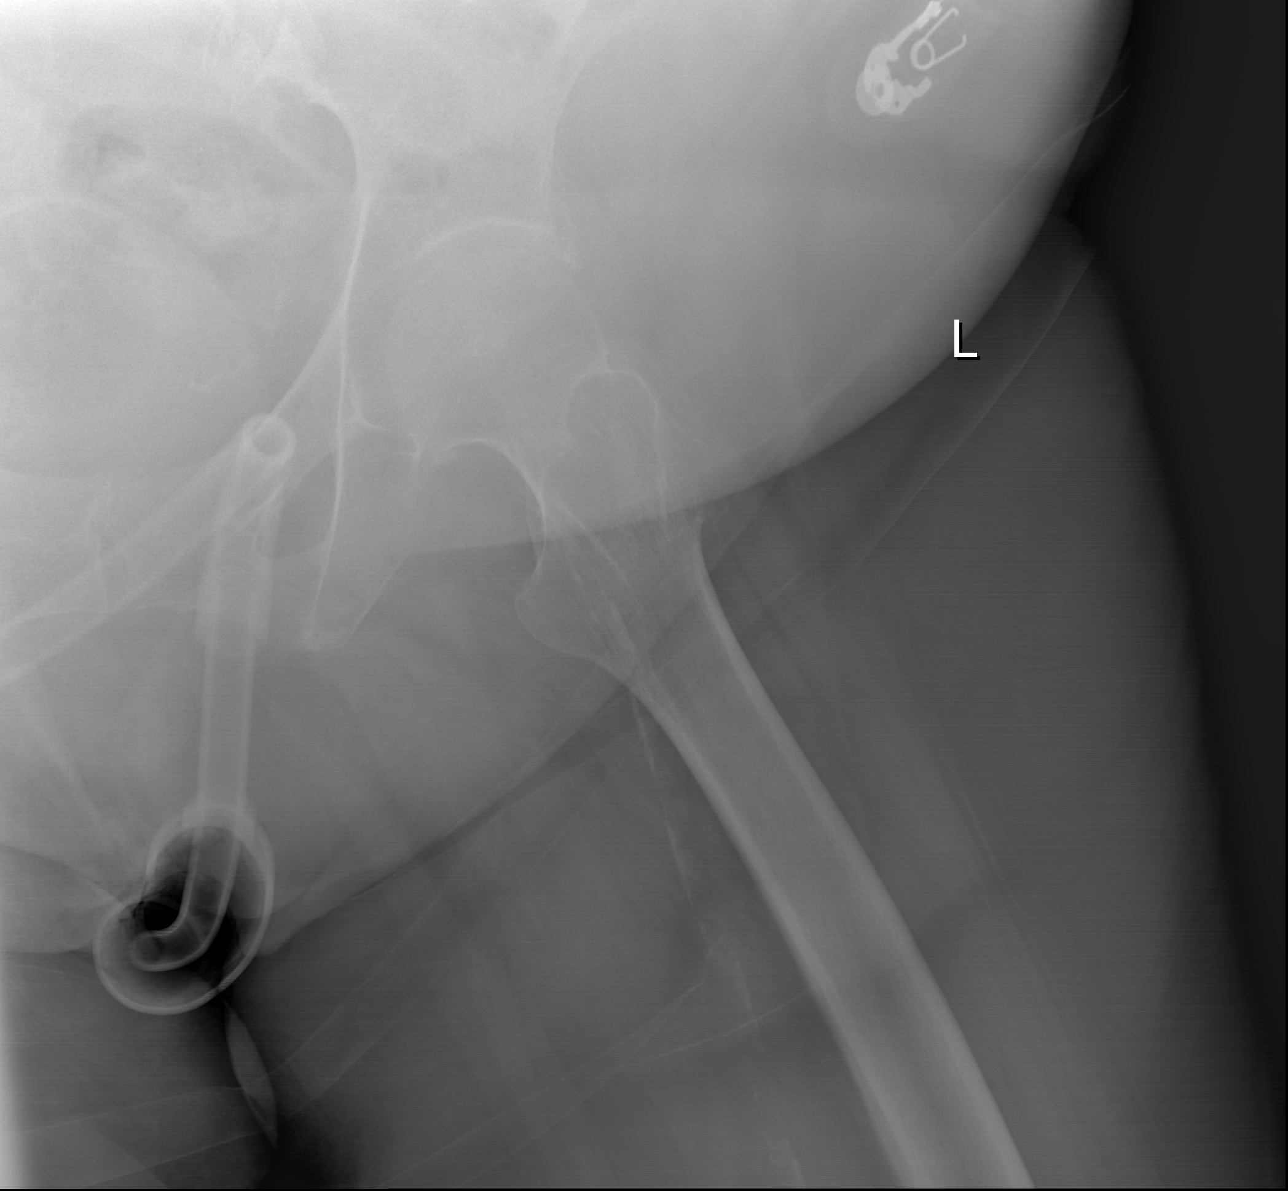

[3 of 3 positions shown; findings below may reference images not displayed]

FINDINGS: Examination is technically limited due to under penetrated
technique. No evidence of acute fracture or dislocation of the
pelvis or left hip. Degenerative changes are seen in the lower
lumbar spine and both hips. Prominent vascular calcifications.
IMPRESSION: No acute bony abnormalities.  Degenerative changes in the hips.

## 2022-08-10 ENCOUNTER — Emergency Department (HOSPITAL_COMMUNITY)
Admission: EM | Admit: 2022-08-10 | Discharge: 2022-08-10 | Disposition: A | Discharge status: Home / Self Care | Payer: Medicare Other | Attending: Emergency Medicine | Admitting: Emergency Medicine

## 2022-08-10 ENCOUNTER — Emergency Department (HOSPITAL_COMMUNITY): Payer: Medicare Other

## 2022-08-10 ENCOUNTER — Encounter (HOSPITAL_COMMUNITY): Payer: Self-pay

## 2022-08-10 DIAGNOSIS — G8929 Other chronic pain: Secondary | ICD-10-CM | POA: Insufficient documentation

## 2022-08-10 DIAGNOSIS — W1830XA Fall on same level, unspecified, initial encounter: Secondary | ICD-10-CM | POA: Insufficient documentation

## 2022-08-10 DIAGNOSIS — M25561 Pain in right knee: Secondary | ICD-10-CM | POA: Diagnosis present

## 2022-08-10 MED ORDER — ACETAMINOPHEN 325 MG PO TABS
650.0000 mg | ORAL_TABLET | Freq: Once | ORAL | Status: AC
  Administered 2022-08-10: 650 mg via ORAL
  Filled 2022-08-10: qty 2

## 2022-08-10 MED ORDER — DICLOFENAC SODIUM 1 % EX GEL: 2.0000 g | Freq: Four times a day (QID) | CUTANEOUS | 0 refills | Status: DC

## 2022-08-10 NOTE — Discharge Instructions (Signed)
You are seen in the ER for your knee pain.  Please ice the affected knee at least twice a day.  Take Tylenol 5 mg every 6 hours.  Additionally, we are prescribing you topical anti-inflammatory medication.  Recommend that he follow-up with orthopedist for optimal evaluation of your ongoing knee pain.

## 2022-08-10 NOTE — ED Triage Notes (Signed)
Pt arrived via EMS, form home, x1 month ago, mechanical fall at drs office. Pain progressively getting worse.

## 2022-08-10 NOTE — ED Notes (Signed)
An After Visit Summary was printed and given to the patient. Discharge instructions given and no further questions at this time.  Pt leaving with daughter. Pt able to stand and turn unassisted, able to take small steps slowly unassisted.

## 2022-08-11 NOTE — ED Provider Notes (Signed)
[facility] DEPT Provider Note   CSN: [id number] Arrival date & time: 08/10/22  1043     History  No chief complaint on file.   Beth Garrison is a 81 y.o. female.  HPI     81 year old female comes in with chief complaint of knee pain.  Patient had a mechanical fall about a month ago.  Ever since then, she has been having pain over her right knee.  She is now using a walker to move around.  Pain is worse with ambulation.  No injury elsewhere.  Home Medications Prior to Admission medications   Medication Sig Start Date End Date Taking? Authorizing Provider  diclofenac Sodium (VOLTAREN) 1 % GEL Apply 2 g topically 4 (four) times daily. 08/10/22  Yes Kairee Isa, MD  amLODipine (NORVASC) 5 MG tablet Take 1 tablet (5 mg total) by mouth daily. 07/17/19   Monica Becton, MD  cefdinir (OMNICEF) 300 MG capsule Take 1 capsule (300 mg total) by mouth 2 (two) times daily. 03/28/21   Georgette Shell, MD  FEROSUL 325 (65 Fe) MG tablet Take 325 mg by mouth daily. 02/21/21   [provider]  glipiZIDE (GLUCOTROL XL) 2.5 MG 24 hr tablet Take 2.5 mg by mouth daily. 03/16/21   [provider]  lisinopril (ZESTRIL) 20 MG tablet Take 20 mg by mouth daily.    [provider]  pantoprazole (PROTONIX) 40 MG tablet Take 1 tablet (40 mg total) by mouth 2 (two) times daily. 07/16/19   Monica Becton, MD  pravastatin (PRAVACHOL) 40 MG tablet Take 1 tablet (40 mg total) by mouth daily. 01/04/15   Janith Lima, MD  TRESIBA FLEXTOUCH 200 UNIT/ML FlexTouch Pen Inject 10 Units into the skin daily. 11/29/20   [provider]      Allergies    Dulaglutide and Gabapentin    Review of Systems   Review of Systems  All other systems reviewed and are negative.   Physical Exam Updated Vital Signs BP 126/78   Pulse 80   Temp 97.6 F (36.4 C) (Oral)   Resp 18   SpO2 98%  Physical Exam Vitals and nursing note reviewed.   Constitutional:      Appearance: She is well-developed.  HENT:     Head: Atraumatic.  Cardiovascular:     Rate and Rhythm: Normal rate.  Pulmonary:     Effort: Pulmonary effort is normal.  Musculoskeletal:     Cervical back: Normal range of motion and neck supple.     Comments: Tenderness to palpation around the knee, mostly on the lateral side.  Able to extend and flex over the knee.  Pain with bearing weight.  No significant discomfort over the right hip.  Skin:    General: Skin is warm and dry.  Neurological:     Mental Status: She is alert and oriented to person, place, and time.     ED Results / Procedures / Treatments   Labs (all labs ordered are listed, but only abnormal results are displayed) Labs Reviewed - No data to display  EKG None  Radiology DG Knee Complete 4 Views Right  Result Date: 08/10/2022 CLINICAL DATA:  [place]. Right knee pain. EXAM: RIGHT KNEE - COMPLETE 4+ VIEW COMPARISON:  None Available. FINDINGS: The joint spaces are fairly well maintained for age. No significant degenerative changes. No acute fracture is identified. No osteochondral abnormality. No joint effusion. Advanced vascular calcifications are noted. IMPRESSION: No acute bony findings or joint effusion.  Electronically Signed   By: Marijo Sanes M.D.   On: 08/10/2022 11:17    Procedures Procedures    Medications Ordered in ED Medications  acetaminophen (TYLENOL) tablet 650 mg (650 mg Oral Given 08/10/22 1211)    ED Course/ Medical Decision Making/ A&P                           Medical Decision Making Amount and/or Complexity of Data Reviewed Radiology: ordered.  Risk OTC drugs.   81 year old female comes in with chief complaint of knee pain.  She had a fall about a month ago, has been having pain in her knee since then.  On exam, patient is able to flex and extend over the knee actively and bear weight.  She is however having discomfort with all of those maneuvers.   Differential  diagnosis includes meniscal injury, severe arthritis, soft tissue injury.  X-ray ordered and interpreted independently.  No evidence of osteoarthritis or severe joint effusion.  I do not think knee immobilizer is a great idea, given balance issues that patient has.  Instead, we have advised that she follows up with orthopedic doctor closely to see if she needs physical therapy or different way of managing her pain.  Patient and the daughter agrees with the plan.  Final Clinical Impression(s) / ED Diagnoses Final diagnoses:  Chronic pain of right knee    Rx / DC Orders ED Discharge Orders          Ordered    diclofenac Sodium (VOLTAREN) 1 % GEL  4 times daily        08/10/22 1153              Varney Biles, MD 08/11/22 [id number]

## 2022-08-23 ENCOUNTER — Encounter (HOSPITAL_COMMUNITY): Payer: Self-pay

## 2022-08-23 ENCOUNTER — Emergency Department (HOSPITAL_COMMUNITY): Payer: Medicare Other

## 2022-08-23 ENCOUNTER — Inpatient Hospital Stay (HOSPITAL_COMMUNITY)
Admission: EM | Admit: 2022-08-23 | Discharge: 2022-08-30 | DRG: 871 | Disposition: A | Discharge status: Skilled Nursing Facility | Payer: Medicare Other | Attending: Internal Medicine | Admitting: Internal Medicine

## 2022-08-23 ENCOUNTER — Observation Stay (HOSPITAL_COMMUNITY): Payer: Medicare Other

## 2022-08-23 DIAGNOSIS — Z888 Allergy status to other drugs, medicaments and biological substances status: Secondary | ICD-10-CM

## 2022-08-23 DIAGNOSIS — D696 Thrombocytopenia, unspecified: Secondary | ICD-10-CM

## 2022-08-23 DIAGNOSIS — Z6830 Body mass index (BMI) 30.0-30.9, adult: Secondary | ICD-10-CM

## 2022-08-23 DIAGNOSIS — J9601 Acute respiratory failure with hypoxia: Secondary | ICD-10-CM | POA: Diagnosis not present

## 2022-08-23 DIAGNOSIS — N390 Urinary tract infection, site not specified: Secondary | ICD-10-CM | POA: Diagnosis not present

## 2022-08-23 DIAGNOSIS — I10 Essential (primary) hypertension: Secondary | ICD-10-CM | POA: Diagnosis not present

## 2022-08-23 DIAGNOSIS — A419 Sepsis, unspecified organism: Secondary | ICD-10-CM | POA: Diagnosis not present

## 2022-08-23 DIAGNOSIS — N179 Acute kidney failure, unspecified: Secondary | ICD-10-CM | POA: Diagnosis present

## 2022-08-23 DIAGNOSIS — T68XXXA Hypothermia, initial encounter: Secondary | ICD-10-CM | POA: Diagnosis present

## 2022-08-23 DIAGNOSIS — Z83438 Family history of other disorder of lipoprotein metabolism and other lipidemia: Secondary | ICD-10-CM

## 2022-08-23 DIAGNOSIS — Z833 Family history of diabetes mellitus: Secondary | ICD-10-CM

## 2022-08-23 DIAGNOSIS — E872 Acidosis, unspecified: Secondary | ICD-10-CM | POA: Diagnosis present

## 2022-08-23 DIAGNOSIS — I129 Hypertensive chronic kidney disease with stage 1 through stage 4 chronic kidney disease, or unspecified chronic kidney disease: Secondary | ICD-10-CM | POA: Diagnosis present

## 2022-08-23 DIAGNOSIS — J9801 Acute bronchospasm: Secondary | ICD-10-CM | POA: Diagnosis present

## 2022-08-23 DIAGNOSIS — E1110 Type 2 diabetes mellitus with ketoacidosis without coma: Secondary | ICD-10-CM | POA: Diagnosis present

## 2022-08-23 DIAGNOSIS — R680 Hypothermia, not associated with low environmental temperature: Secondary | ICD-10-CM | POA: Diagnosis present

## 2022-08-23 DIAGNOSIS — D631 Anemia in chronic kidney disease: Secondary | ICD-10-CM | POA: Diagnosis present

## 2022-08-23 DIAGNOSIS — E870 Hyperosmolality and hypernatremia: Secondary | ICD-10-CM

## 2022-08-23 DIAGNOSIS — N3000 Acute cystitis without hematuria: Secondary | ICD-10-CM | POA: Diagnosis present

## 2022-08-23 DIAGNOSIS — R6520 Severe sepsis without septic shock: Secondary | ICD-10-CM | POA: Diagnosis present

## 2022-08-23 DIAGNOSIS — Z79890 Hormone replacement therapy (postmenopausal): Secondary | ICD-10-CM

## 2022-08-23 DIAGNOSIS — R0603 Acute respiratory distress: Principal | ICD-10-CM

## 2022-08-23 DIAGNOSIS — D649 Anemia, unspecified: Secondary | ICD-10-CM | POA: Diagnosis present

## 2022-08-23 DIAGNOSIS — Z79899 Other long term (current) drug therapy: Secondary | ICD-10-CM

## 2022-08-23 DIAGNOSIS — J9811 Atelectasis: Secondary | ICD-10-CM | POA: Diagnosis present

## 2022-08-23 DIAGNOSIS — E440 Moderate protein-calorie malnutrition: Secondary | ICD-10-CM | POA: Insufficient documentation

## 2022-08-23 DIAGNOSIS — E669 Obesity, unspecified: Secondary | ICD-10-CM

## 2022-08-23 DIAGNOSIS — Z794 Long term (current) use of insulin: Secondary | ICD-10-CM

## 2022-08-23 DIAGNOSIS — E1122 Type 2 diabetes mellitus with diabetic chronic kidney disease: Secondary | ICD-10-CM | POA: Diagnosis present

## 2022-08-23 DIAGNOSIS — G9341 Metabolic encephalopathy: Secondary | ICD-10-CM | POA: Diagnosis present

## 2022-08-23 DIAGNOSIS — E8721 Acute metabolic acidosis: Secondary | ICD-10-CM | POA: Diagnosis present

## 2022-08-23 DIAGNOSIS — K219 Gastro-esophageal reflux disease without esophagitis: Secondary | ICD-10-CM | POA: Diagnosis present

## 2022-08-23 DIAGNOSIS — E785 Hyperlipidemia, unspecified: Secondary | ICD-10-CM | POA: Diagnosis present

## 2022-08-23 DIAGNOSIS — N1832 Chronic kidney disease, stage 3b: Secondary | ICD-10-CM | POA: Diagnosis present

## 2022-08-23 DIAGNOSIS — L89301 Pressure ulcer of unspecified buttock, stage 1: Secondary | ICD-10-CM

## 2022-08-23 DIAGNOSIS — R410 Disorientation, unspecified: Secondary | ICD-10-CM | POA: Diagnosis not present

## 2022-08-23 DIAGNOSIS — F17200 Nicotine dependence, unspecified, uncomplicated: Secondary | ICD-10-CM | POA: Diagnosis present

## 2022-08-23 DIAGNOSIS — R509 Fever, unspecified: Secondary | ICD-10-CM

## 2022-08-23 DIAGNOSIS — Z87440 Personal history of urinary (tract) infections: Secondary | ICD-10-CM

## 2022-08-23 DIAGNOSIS — I9589 Other hypotension: Secondary | ICD-10-CM | POA: Diagnosis present

## 2022-08-23 DIAGNOSIS — E118 Type 2 diabetes mellitus with unspecified complications: Secondary | ICD-10-CM | POA: Diagnosis present

## 2022-08-23 LAB — CBC WITH DIFFERENTIAL/PLATELET
Abs Immature Granulocytes: 0.06 10*3/uL (ref 0.00–0.07)
Basophils Absolute: 0 10*3/uL (ref 0.0–0.1)
Basophils Relative: 0 %
Eosinophils Absolute: 0 10*3/uL (ref 0.0–0.5)
Eosinophils Relative: 0 %
HCT: 36 % (ref 36.0–46.0)
Hemoglobin: 10.8 g/dL — ABNORMAL LOW (ref 12.0–15.0)
Immature Granulocytes: 1 %
Lymphocytes Relative: 28 %
Lymphs Abs: 3.3 10*3/uL (ref 0.7–4.0)
MCH: 28.4 pg (ref 26.0–34.0)
MCHC: 30 g/dL (ref 30.0–36.0)
MCV: 94.7 fL (ref 80.0–100.0)
Monocytes Absolute: 0.7 10*3/uL (ref 0.1–1.0)
Monocytes Relative: 6 %
Neutro Abs: 7.9 10*3/uL — ABNORMAL HIGH (ref 1.7–7.7)
Neutrophils Relative %: 65 %
Platelets: 225 10*3/uL (ref 150–400)
RBC: 3.8 MIL/uL — ABNORMAL LOW (ref 3.87–5.11)
RDW: 15.9 % — ABNORMAL HIGH (ref 11.5–15.5)
WBC: 12.1 10*3/uL — ABNORMAL HIGH (ref 4.0–10.5)
nRBC: 0.2 % (ref 0.0–0.2)

## 2022-08-23 LAB — BASIC METABOLIC PANEL
BUN: 66 mg/dL — ABNORMAL HIGH (ref 8–23)
CO2: <7 mmol/L — ABNORMAL LOW (ref 22–32)
Calcium: 8.1 mg/dL — ABNORMAL LOW (ref 8.9–10.3)
Chloride: >130 mmol/L (ref 98–111)
Creatinine, Ser: 2.81 mg/dL — ABNORMAL HIGH (ref 0.44–1.00)
GFR, Estimated: 17 mL/min — ABNORMAL LOW (ref >60)
Glucose, Bld: 157 mg/dL — ABNORMAL HIGH (ref 70–99)
Potassium: 4.6 mmol/L (ref 3.5–5.1)
Sodium: 146 mmol/L — ABNORMAL HIGH (ref 135–145)

## 2022-08-23 LAB — COMPREHENSIVE METABOLIC PANEL
ALT: 18 U/L (ref 0–44)
AST: 18 U/L (ref 15–41)
Albumin: 3.5 g/dL (ref 3.5–5.0)
Alkaline Phosphatase: 127 U/L — ABNORMAL HIGH (ref 38–126)
BUN: 69 mg/dL — ABNORMAL HIGH (ref 8–23)
CO2: <7 mmol/L — ABNORMAL LOW (ref 22–32)
Calcium: 8.6 mg/dL — ABNORMAL LOW (ref 8.9–10.3)
Chloride: 129 mmol/L — ABNORMAL HIGH (ref 98–111)
Creatinine, Ser: 2.72 mg/dL — ABNORMAL HIGH (ref 0.44–1.00)
GFR, Estimated: 17 mL/min — ABNORMAL LOW (ref >60)
Glucose, Bld: 79 mg/dL (ref 70–99)
Potassium: 4.4 mmol/L (ref 3.5–5.1)
Sodium: 145 mmol/L (ref 135–145)
Total Bilirubin: 0.6 mg/dL (ref 0.3–1.2)
Total Protein: 6.7 g/dL (ref 6.5–8.1)

## 2022-08-23 LAB — RESPIRATORY PANEL BY PCR

## 2022-08-23 LAB — GLUCOSE, CAPILLARY
Glucose-Capillary: 156 mg/dL — ABNORMAL HIGH (ref 70–99)
Glucose-Capillary: 227 mg/dL — ABNORMAL HIGH (ref 70–99)

## 2022-08-23 LAB — TROPONIN I (HIGH SENSITIVITY)
Troponin I (High Sensitivity): 8 ng/L (ref <18)
Troponin I (High Sensitivity): 8 ng/L (ref <18)

## 2022-08-23 LAB — URINALYSIS, ROUTINE W REFLEX MICROSCOPIC
Bilirubin Urine: NEGATIVE
Glucose, UA: NEGATIVE mg/dL
Hgb urine dipstick: NEGATIVE
Ketones, ur: 5 mg/dL — AB
Nitrite: NEGATIVE
Protein, ur: 100 mg/dL — AB
Specific Gravity, Urine: 1.012 (ref 1.005–1.030)
WBC, UA: >50 WBC/hpf — ABNORMAL HIGH (ref 0–5)
pH: 5 (ref 5.0–8.0)

## 2022-08-23 LAB — BRAIN NATRIURETIC PEPTIDE
B Natriuretic Peptide: 39.7 pg/mL (ref 0.0–100.0)
B Natriuretic Peptide: 85.1 pg/mL (ref 0.0–100.0)

## 2022-08-23 LAB — BLOOD GAS, VENOUS
Acid-base deficit: 26.9 mmol/L — ABNORMAL HIGH (ref 0.0–2.0)
Bicarbonate: 3.2 mmol/L — ABNORMAL LOW (ref 20.0–28.0)
O2 Saturation: 98.4 %
Patient temperature: 34.7
pCO2, Ven: <18 mmHg — CL (ref 44–60)
pH, Ven: 7 — CL (ref 7.25–7.43)
pO2, Ven: 62 mmHg — ABNORMAL HIGH (ref 32–45)

## 2022-08-23 LAB — APTT: aPTT: 30 seconds (ref 24–36)

## 2022-08-23 LAB — LACTIC ACID, PLASMA
Lactic Acid, Venous: 1.4 mmol/L (ref 0.5–1.9)
Lactic Acid, Venous: 2.5 mmol/L (ref 0.5–1.9)
Lactic Acid, Venous: 3.4 mmol/L (ref 0.5–1.9)

## 2022-08-23 LAB — STREP PNEUMONIAE URINARY ANTIGEN: Strep Pneumo Urinary Antigen: NEGATIVE

## 2022-08-23 LAB — MRSA NEXT GEN BY PCR, NASAL: MRSA by PCR Next Gen: NOT DETECTED

## 2022-08-23 LAB — PROTIME-INR
INR: 1.6 — ABNORMAL HIGH (ref 0.8–1.2)
Prothrombin Time: 18.6 seconds — ABNORMAL HIGH (ref 11.4–15.2)

## 2022-08-23 LAB — BETA-HYDROXYBUTYRIC ACID: Beta-Hydroxybutyric Acid: 3.75 mmol/L — ABNORMAL HIGH (ref 0.05–0.27)

## 2022-08-23 LAB — HEMOGLOBIN A1C
Hgb A1c MFr Bld: 6.8 % — ABNORMAL HIGH (ref 4.8–5.6)
Mean Plasma Glucose: 148.46 mg/dL

## 2022-08-23 MED ORDER — PANTOPRAZOLE SODIUM 40 MG PO TBEC: 40.0000 mg | DELAYED_RELEASE_TABLET | Freq: Every day | ORAL | Status: DC

## 2022-08-23 MED ORDER — PREDNISONE 20 MG PO TABS: 40.0000 mg | ORAL_TABLET | Freq: Every day | ORAL | Status: DC

## 2022-08-23 MED ORDER — PIPERACILLIN-TAZOBACTAM 3.375 G IVPB
3.3750 g | Freq: Two times a day (BID) | INTRAVENOUS | Status: DC
  Administered 2022-08-23 – 2022-08-24 (×3): 3.375 g via INTRAVENOUS
  Filled 2022-08-23 (×3): qty 50

## 2022-08-23 MED ORDER — LACTATED RINGERS IV BOLUS (SEPSIS): 1000.0000 mL | Freq: Once | INTRAVENOUS | Status: DC

## 2022-08-23 MED ORDER — INSULIN ASPART 100 UNIT/ML IJ SOLN: 0.0000 [IU] | Freq: Three times a day (TID) | INTRAMUSCULAR | Status: DC

## 2022-08-23 MED ORDER — SODIUM CHLORIDE 0.9 % IV SOLN: 1.0000 g | INTRAVENOUS | Status: DC

## 2022-08-23 MED ORDER — CHLORHEXIDINE GLUCONATE CLOTH 2 % EX PADS
6.0000 | MEDICATED_PAD | Freq: Every day | CUTANEOUS | Status: DC
  Administered 2022-08-23 – 2022-08-26 (×4): 6 via TOPICAL

## 2022-08-23 MED ORDER — ONDANSETRON HCL 4 MG PO TABS
4.0000 mg | ORAL_TABLET | Freq: Four times a day (QID) | ORAL | Status: DC | PRN
  Administered 2022-08-26: 4 mg via ORAL
  Filled 2022-08-23: qty 1

## 2022-08-23 MED ORDER — DEXAMETHASONE SODIUM PHOSPHATE 10 MG/ML IJ SOLN
10.0000 mg | Freq: Once | INTRAMUSCULAR | Status: AC
  Administered 2022-08-23: 10 mg via INTRAVENOUS
  Filled 2022-08-23: qty 1

## 2022-08-23 MED ORDER — ONDANSETRON HCL 4 MG/2ML IJ SOLN
4.0000 mg | Freq: Four times a day (QID) | INTRAMUSCULAR | Status: DC | PRN
  Administered 2022-08-27: 4 mg via INTRAVENOUS
  Filled 2022-08-23 (×2): qty 2

## 2022-08-23 MED ORDER — SODIUM CHLORIDE 0.9 % IV SOLN: 2.0000 g | INTRAVENOUS | Status: DC

## 2022-08-23 MED ORDER — SODIUM CHLORIDE 0.9 % IV BOLUS
1000.0000 mL | Freq: Once | INTRAVENOUS | Status: AC
  Administered 2022-08-23: 1000 mL via INTRAVENOUS

## 2022-08-23 MED ORDER — ALBUTEROL SULFATE (2.5 MG/3ML) 0.083% IN NEBU: 2.5000 mg | INHALATION_SOLUTION | RESPIRATORY_TRACT | Status: DC | PRN

## 2022-08-23 MED ORDER — BUDESONIDE 0.5 MG/2ML IN SUSP
0.5000 mg | Freq: Two times a day (BID) | RESPIRATORY_TRACT | Status: DC
  Administered 2022-08-23 – 2022-08-29 (×12): 0.5 mg via RESPIRATORY_TRACT
  Filled 2022-08-23 (×12): qty 2

## 2022-08-23 MED ORDER — BUDESONIDE 0.25 MG/2ML IN SUSP: 0.2500 mg | Freq: Two times a day (BID) | RESPIRATORY_TRACT | Status: DC

## 2022-08-23 MED ORDER — SODIUM CHLORIDE 0.9 % IV SOLN
1.0000 g | Freq: Once | INTRAVENOUS | Status: AC
  Administered 2022-08-23: 1 g via INTRAVENOUS
  Filled 2022-08-23: qty 10

## 2022-08-23 MED ORDER — ARFORMOTEROL TARTRATE 15 MCG/2ML IN NEBU
15.0000 ug | INHALATION_SOLUTION | Freq: Two times a day (BID) | RESPIRATORY_TRACT | Status: DC
  Administered 2022-08-23 – 2022-08-29 (×12): 15 ug via RESPIRATORY_TRACT
  Filled 2022-08-23 (×12): qty 2

## 2022-08-23 MED ORDER — LACTATED RINGERS IV BOLUS: 1000.0000 mL | Freq: Once | INTRAVENOUS | Status: DC

## 2022-08-23 MED ORDER — LACTATED RINGERS IV BOLUS
1000.0000 mL | Freq: Once | INTRAVENOUS | Status: AC
  Administered 2022-08-23: 1000 mL via INTRAVENOUS

## 2022-08-23 MED ORDER — ACETAMINOPHEN 325 MG PO TABS
650.0000 mg | ORAL_TABLET | Freq: Four times a day (QID) | ORAL | Status: DC | PRN
  Administered 2022-08-26 – 2022-08-29 (×4): 650 mg via ORAL
  Filled 2022-08-23 (×4): qty 2

## 2022-08-23 MED ORDER — SODIUM BICARBONATE 8.4 % IV SOLN
100.0000 meq | Freq: Once | INTRAVENOUS | Status: AC
  Administered 2022-08-23: 100 meq via INTRAVENOUS
  Filled 2022-08-23: qty 50

## 2022-08-23 MED ORDER — METHYLPREDNISOLONE SODIUM SUCC 40 MG IJ SOLR
40.0000 mg | Freq: Every day | INTRAMUSCULAR | Status: DC
  Administered 2022-08-23 – 2022-08-24 (×2): 40 mg via INTRAVENOUS
  Filled 2022-08-23 (×2): qty 1

## 2022-08-23 MED ORDER — IPRATROPIUM-ALBUTEROL 0.5-2.5 (3) MG/3ML IN SOLN
3.0000 mL | Freq: Once | RESPIRATORY_TRACT | Status: AC
  Administered 2022-08-23: 3 mL via RESPIRATORY_TRACT
  Filled 2022-08-23: qty 3

## 2022-08-23 MED ORDER — SODIUM BICARBONATE 8.4 % IV SOLN
INTRAVENOUS | Status: AC
  Filled 2022-08-23: qty 1000

## 2022-08-23 MED ORDER — SODIUM CHLORIDE 0.9 % IV SOLN
500.0000 mg | Freq: Once | INTRAVENOUS | Status: AC
  Administered 2022-08-23: 500 mg via INTRAVENOUS
  Filled 2022-08-23: qty 5

## 2022-08-23 MED ORDER — AZITHROMYCIN 250 MG PO TABS: 500.0000 mg | ORAL_TABLET | Freq: Every day | ORAL | Status: DC

## 2022-08-23 MED ORDER — ALBUTEROL SULFATE (2.5 MG/3ML) 0.083% IN NEBU: 2.5000 mg | INHALATION_SOLUTION | Freq: Four times a day (QID) | RESPIRATORY_TRACT | Status: DC

## 2022-08-23 MED ORDER — PANTOPRAZOLE SODIUM 40 MG IV SOLR
40.0000 mg | Freq: Every day | INTRAVENOUS | Status: DC
  Administered 2022-08-23 – 2022-08-25 (×3): 40 mg via INTRAVENOUS
  Filled 2022-08-23 (×3): qty 10

## 2022-08-23 MED ORDER — INSULIN DETEMIR 100 UNIT/ML ~~LOC~~ SOLN
10.0000 [IU] | Freq: Every day | SUBCUTANEOUS | Status: DC
  Administered 2022-08-23: 10 [IU] via SUBCUTANEOUS
  Filled 2022-08-23 (×2): qty 0.1

## 2022-08-23 MED ORDER — LACTATED RINGERS IV BOLUS (SEPSIS)
1000.0000 mL | Freq: Once | INTRAVENOUS | Status: AC
  Administered 2022-08-23: 1000 mL via INTRAVENOUS

## 2022-08-23 MED ORDER — ACETAMINOPHEN 650 MG RE SUPP: 650.0000 mg | Freq: Four times a day (QID) | RECTAL | Status: DC | PRN

## 2022-08-23 MED ORDER — SODIUM BICARBONATE 8.4 % IV SOLN
INTRAVENOUS | Status: AC
  Filled 2022-08-23: qty 50

## 2022-08-23 MED ORDER — IPRATROPIUM BROMIDE 0.02 % IN SOLN: 0.5000 mg | Freq: Four times a day (QID) | RESPIRATORY_TRACT | Status: DC

## 2022-08-23 MED ORDER — IPRATROPIUM-ALBUTEROL 0.5-2.5 (3) MG/3ML IN SOLN
3.0000 mL | Freq: Four times a day (QID) | RESPIRATORY_TRACT | Status: DC
  Administered 2022-08-23 – 2022-08-26 (×12): 3 mL via RESPIRATORY_TRACT
  Filled 2022-08-23 (×13): qty 3

## 2022-08-23 MED ORDER — SODIUM CHLORIDE 0.45 % IV SOLN: INTRAVENOUS | Status: DC

## 2022-08-23 MED ORDER — HEPARIN SODIUM (PORCINE) 5000 UNIT/ML IJ SOLN
5000.0000 [IU] | Freq: Three times a day (TID) | INTRAMUSCULAR | Status: DC
  Administered 2022-08-23 – 2022-08-26 (×8): 5000 [IU] via SUBCUTANEOUS
  Filled 2022-08-23 (×8): qty 1

## 2022-08-23 NOTE — H&P (Signed)
History and Physical    Patient: Beth Garrison ZOX:[id number] DOB: [birth year] DOA: 08/23/2022 DOS: the patient was seen and examined on 08/23/2022 PCP: Mindi Curling, PA-C  Patient coming from: Home  Chief Complaint:  Chief Complaint  Patient presents with   Respiratory Distress   HPI: Beth Garrison is a 81 y.o. female with medical history significant of AKI, stage IIIa CKD, hyperkalemia, history of melena, history of sepsis, type II DM, type 2 diabetes, GERD, hyperlipidemia, hypertension, intractable nausea and vomiting, unspecified anemia who presented to the emergency department with respiratory distress for the past 3 days associated with nausea, vomiting, decreased appetite, fatigue and malaise.  No fever, chills or night sweats. No sore throat, rhinorrhea, or hemoptysis.  No chest pain, palpitations, diaphoresis, PND, orthopnea or pitting edema of the lower extremities.  No appetite changes, abdominal pain, diarrhea, constipation, melena or hematochezia.  No flank pain, dysuria, frequency or hematuria.  No polyuria, polydipsia, polyphagia or blurred vision.  ED course: Initial vital signs were temperature 97.6 F, pulse 73, respirations 24, blood pressure 72/37 mmHg O2 sat 100% on NRB oxygen.  The patient received azithromycin 500 mg IVPB, ceftriaxone 1 g IVPB, dexamethasone 10 mg IVP, 1 DuoNeb, 1000 mL of normal saline bolus and 1000 mL of LR bolus.  I have added 2000 mL of LR bolus after the 2 ED boluses.  Lab work: Her urinalysis showed large leukocyte esterase, more than 50 WBC, many bacteria and WBC clumps.  CBC showed a white count of 12.1, 110.8 g/dL platelets 225.  Lactic acid was 1.4 and then 2.5 mmol/L.  PT 18.6, INR 1.6 and PTT 30.  Troponin was normal twice.  CMP showed a sodium 145, potassium 4.4, chloride 129 and CO2 less than 7 mmol/L.  BUN 69, creatinine 2.72 and calcium 8.6 mg/dL.  Calcium normalizes when corrected.  Alkaline phosphatase slightly elevated 127 units/L.  BNP was  normal.  The rest of the electrolytes and LFTs are normal.  Venous blood gases subsequently obtained after arrival to the stepdown unit and showed a pH of 7.00, PCO2 less than 18 and PO2 of 62 mmHg.  Bicarbonate was 3.2 and acid-base deficit 26.9 mmol/L.  Imaging: A portable 2 view chest radiograph with no active disease  Review of Systems: As mentioned in the history of present illness. All other systems reviewed and are negative. Past Medical History:  Diagnosis Date   AKI (acute kidney injury) ([place]) 07/07/2019   Diabetes mellitus without complication ([place])    GERD (gastroesophageal reflux disease)    Hyperlipidemia    Hypertension    Past Surgical History:  Procedure Laterality Date   BIOPSY  07/10/2019   Procedure: BIOPSY;  Surgeon: Carol Ada, MD;  Location: [facility];  Service: Endoscopy;;   ESOPHAGOGASTRODUODENOSCOPY (EGD) WITH PROPOFOL Left 07/10/2019   Procedure: ESOPHAGOGASTRODUODENOSCOPY (EGD) WITH PROPOFOL;  Surgeon: Carol Ada, MD;  Location: [facility];  Service: Endoscopy;  Laterality: Left;   TONSILLECTOMY AND ADENOIDECTOMY     Social History:  reports that she has never smoked. She has never used smokeless tobacco. She reports that she does not drink alcohol and does not use drugs.  Allergies  Allergen Reactions   Dulaglutide Diarrhea   Gabapentin Diarrhea    Family History  Problem Relation Age of Onset   Diabetes Mother    Hyperlipidemia Mother    Diabetes Father    Diabetes Brother    Diabetes Maternal Grandmother    Diabetes Paternal Grandmother    Cancer Neg Hx  Stroke Neg Hx     Prior to Admission medications   Medication Sig Start Date End Date Taking? Authorizing Provider  amLODipine (NORVASC) 5 MG tablet Take 1 tablet (5 mg total) by mouth daily. 07/17/19   Monica Becton, MD  cefdinir (OMNICEF) 300 MG capsule Take 1 capsule (300 mg total) by mouth 2 (two) times daily. 03/28/21   Georgette Shell, MD  diclofenac Sodium (VOLTAREN) 1 %  GEL Apply 2 g topically 4 (four) times daily. 08/10/22   Varney Biles, MD  FARXIGA 10 MG TABS tablet Take 10 mg by mouth daily. 08/08/22   [provider]  FEROSUL 325 (65 Fe) MG tablet Take 325 mg by mouth daily. 02/21/21   [provider]  glipiZIDE (GLUCOTROL XL) 2.5 MG 24 hr tablet Take 2.5 mg by mouth daily. 03/16/21   [provider]  LEVEMIR FLEXPEN 100 UNIT/ML FlexPen SMARTSIG:10 Unit(s) SUB-Q Twice Daily 08/16/22   [provider]  lisinopril (ZESTRIL) 20 MG tablet Take 20 mg by mouth daily.    [provider]  pantoprazole (PROTONIX) 40 MG tablet Take 1 tablet (40 mg total) by mouth 2 (two) times daily. 07/16/19   Monica Becton, MD  pravastatin (PRAVACHOL) 40 MG tablet Take 1 tablet (40 mg total) by mouth daily. 01/04/15   Janith Lima, MD  pravastatin (PRAVACHOL) 80 MG tablet Take 80 mg by mouth daily. 08/15/22   [provider]  TRESIBA FLEXTOUCH 200 UNIT/ML FlexTouch Pen Inject 10 Units into the skin daily. 11/29/20   [provider]    Physical Exam: Vitals:   08/23/22 1430 08/23/22 1435 08/23/22 1500 08/23/22 1525  BP: (!) 93/43 (!) 102/29    Pulse: (!) 108 (!) 109 (!) 111 (!) 116  Resp: (!) 29 (!) 26 (!) 22 (!) 23  Temp:    (!) 94.3 F (34.6 C)  TempSrc:    Rectal  SpO2: 100% 100% 99% 100%  Height:       Physical Exam Vitals and nursing note reviewed.  Constitutional:      General: She is awake.     Appearance: Normal appearance. She is ill-appearing. She is not diaphoretic.     Interventions: Nasal cannula in place.  HENT:     Head: Normocephalic.     Nose: No rhinorrhea.     Mouth/Throat:     Mouth: Mucous membranes are dry.  Eyes:     General: No scleral icterus.    Pupils: Pupils are equal, round, and reactive to light.  Neck:     Vascular: No JVD.  Cardiovascular:     Rate and Rhythm: Regular rhythm. Tachycardia present.     Heart sounds: S1 normal and S2 normal.  Pulmonary:     Effort:  Tachypnea present. No accessory muscle usage.     Breath sounds: No wheezing, rhonchi or rales.  Abdominal:     General: Bowel sounds are normal.     Palpations: Abdomen is soft.     Tenderness: There is abdominal tenderness. There is right CVA tenderness. There is no guarding.  Musculoskeletal:     Cervical back: Neck supple.     Right lower leg: No edema.     Left lower leg: No edema.  Skin:    General: Skin is warm and dry.  Neurological:     General: No focal deficit present.     Mental Status: She is alert and oriented to person, place, and time.  Psychiatric:  Mood and Affect: Mood normal.        Behavior: Behavior normal. Behavior is cooperative.   Data Reviewed:  There are no new results to review at this time.  Assessment and Plan: Principal Problem:   Acute respiratory distress/failure (HCC) Secondary to:   Metabolic acidosis In the setting of:   Sepsis secondary to UTI ([place]) Associated with:   Hypothermia Admit to SDU/inpatient. Continue IV fluids. Bicarbonate 100 mEq x 1. Bicarbonate infusion. Continue warming blanket. Continue ceftriaxone 1 g IVPB. Follow urine culture and sensitivity. Follow-up blood culture and sensitivity Follow CBC and CMP in a.m. PCCM consult appreciated.  Active Problems:   AKI (acute kidney injury) ([place]) Superimposed on:   Stage 3b chronic kidney disease (CKD) (HCC) Observation/telemetry. Continue IV fluids. Avoid hypotension. Avoid nephrotoxins. Monitor intake and output. Monitor renal function electrolytes.    Type II diabetes mellitus with manifestations (HCC) Carbohydrate modified diet. CBG monitoring before meals and bedtime. Continue Levemir 10 units SQ at bedtime.    Hyperlipidemia with target LDL less than 100 Hold statin for now.    HTN (hypertension) Chronic hypotensive. Hold antihypertensives Monitor BP.    Normocytic anemia Monitor hematocrit and hemoglobin.      Advance Care Planning:    Code Status: Full Code   Consults:   Family Communication: Her daughter was at bedside.  Severity of Illness: The appropriate patient status for this patient is OBSERVATION. Observation status is judged to be reasonable and necessary in order to provide the required intensity of service to ensure the patient's safety. The patient's presenting symptoms, physical exam findings, and initial radiographic and laboratory data in the context of their medical condition is felt to place them at decreased risk for further clinical deterioration. Furthermore, it is anticipated that the patient will be medically stable for discharge from the hospital within 2 midnights of admission.   Author: Reubin Milan, MD 08/23/2022 3:54 PM  For on call review [email or web address].   This document was prepared using Dragon voice recognition software and may contain some unintended transcription errors.

## 2022-08-23 NOTE — ED Provider Notes (Signed)
[facility] DEPT Provider Note   CSN: [id number] Arrival date & time: 08/23/22  1036     History  Chief Complaint  Patient presents with   Respiratory Distress    Beth Garrison is a 81 y.o. female.  HPI Patient presents in respiratory distress, hypotensive, via EMS.  History is obtained by those individuals and the patient herself.  EMS notes the patient called out for weakness, dyspnea.  She has no history of asthma, states that she does not smoke.  With past.  She has been progressively weaker, with increased work of breathing.  EMS reports patient was hypotensive, 80/40 on arrival, received bronchodilators with some improvement in her work of breathing, and decreased wheezing.  No reported fever, patient denies vomiting, patient denies pain.    Home Medications Prior to Admission medications   Medication Sig Start Date End Date Taking? Authorizing Provider  amLODipine (NORVASC) 5 MG tablet Take 1 tablet (5 mg total) by mouth daily. 07/17/19   Monica Becton, MD  cefdinir (OMNICEF) 300 MG capsule Take 1 capsule (300 mg total) by mouth 2 (two) times daily. 03/28/21   Georgette Shell, MD  diclofenac Sodium (VOLTAREN) 1 % GEL Apply 2 g topically 4 (four) times daily. 08/10/22   Varney Biles, MD  FEROSUL 325 (65 Fe) MG tablet Take 325 mg by mouth daily. 02/21/21   [provider]  glipiZIDE (GLUCOTROL XL) 2.5 MG 24 hr tablet Take 2.5 mg by mouth daily. 03/16/21   [provider]  lisinopril (ZESTRIL) 20 MG tablet Take 20 mg by mouth daily.    [provider]  pantoprazole (PROTONIX) 40 MG tablet Take 1 tablet (40 mg total) by mouth 2 (two) times daily. 07/16/19   Monica Becton, MD  pravastatin (PRAVACHOL) 40 MG tablet Take 1 tablet (40 mg total) by mouth daily. 01/04/15   Janith Lima, MD  TRESIBA FLEXTOUCH 200 UNIT/ML FlexTouch Pen Inject 10 Units into the skin daily. 11/29/20   [provider]       Allergies    Dulaglutide and Gabapentin    Review of Systems   Review of Systems  Unable to perform ROS: Acuity of condition    Physical Exam Updated Vital Signs BP (!) 72/37 (BP Location: Left Arm)   Pulse (!) 103   Temp 97.6 F (36.4 C) (Oral)   Resp (!) 24   Ht '5\' 5"'$  (1.651 m)   SpO2 100%   BMI 32.21 kg/m  Physical Exam Vitals and nursing note reviewed.  Constitutional:      General: She is in acute distress.     Appearance: She is well-developed. She is obese. She is ill-appearing and diaphoretic.  HENT:     Head: Normocephalic and atraumatic.  Eyes:     Conjunctiva/sclera: Conjunctivae normal.  Cardiovascular:     Rate and Rhythm: Normal rate and regular rhythm.  Pulmonary:     Effort: Tachypnea, accessory muscle usage and respiratory distress present.     Breath sounds: No stridor. No rhonchi or rales.  Abdominal:     General: There is no distension.  Skin:    General: Skin is warm.  Neurological:     Mental Status: She is alert and oriented to person, place, and time.     Cranial Nerves: No cranial nerve deficit.  Psychiatric:        Mood and Affect: Mood normal.     ED Results / Procedures / Treatments   Labs (all labs  ordered are listed, but only abnormal results are displayed) Labs Reviewed  LACTIC ACID, PLASMA  LACTIC ACID, PLASMA  COMPREHENSIVE METABOLIC PANEL  CBC WITH DIFFERENTIAL/PLATELET  PROTIME-INR  APTT  URINALYSIS, ROUTINE W REFLEX MICROSCOPIC  BRAIN NATRIURETIC PEPTIDE    EKG None  Radiology No results found.  Procedures Procedures    Medications Ordered in ED Medications  lactated ringers bolus 1,000 mL (has no administration in time range)    ED Course/ Medical Decision Making/ A&P This patient with a Hx of multiple medical problems including prior episode of melena, sepsis, acute renal injury presents to the ED for concern of mass, respiratory distress, hypotension, this involves an extensive number of treatment  options, and is a complaint that carries with it a high risk of complications and morbidity.    The differential diagnosis includes sepsis, malnutrition/dehydration, bacteremia, pneumonia, electrolyte abnormalities, pneumonia   Social Determinants of Health:  Obesity, age  Additional history obtained:  Additional history and/or information obtained from EMS, chart review, notable for details included above in HPI   After the initial evaluation, orders, including: Ongoing bronchodilators, fluid resuscitation, labs were initiated.   Patient placed on Cardiac and Pulse-Oximetry Monitors. The patient was maintained on a cardiac monitor.  The cardiac monitored showed an rhythm of 105 sinus tach abnormal The patient was also maintained on pulse oximetry. The readings were typically 96% with nonrebreather mask abnormal   On repeat evaluation of the patient improved Patient's oxygen requirement was down to nasal cannula on repeat exam, she was joined by her daughter, who notes the patient has a long history of smoking, though she had no formal diagnosis of COPD, this was a suspicion. Daughter notes that the patient has had similar episodes in the past, with weakness followed by anorexia, with eventual hospitalization.  Patient aware of results thus far, including concern for COPD exacerbation, complicated by acute kidney injury.  No early evidence for source of infection, though she received azithromycin with Decadron for her COPD exacerbation, with hypothermia, ceftriaxone added for additional coverage, pending urinalysis which may be a further complication for her COPD exacerbation/AKI. Lab Tests:  I personally interpreted labs.  The pertinent results include: Initially patient had no lactic acidosis, this did increase slightly after she received multiple bronchodilators, supplemental oxygen.  Patient's urinalysis came back eventually positive for leukocytes, with leukocytes, as above she  received ceftriaxone should be appropriate for this.  Imaging Studies ordered:  I independently visualized and interpreted imaging which showed no obvious pneumonia, lungs consistent with COPD I agree with the radiologist interpretation  Consultations Obtained:  I requested consultation with the medicine,  and discussed lab and imaging findings as well as pertinent plan - they recommend: To stepdown unit  Dispostion / Final MDM:  After consideration of the diagnostic results and the patient's response to treatment, adult female with multiple medical problems presents with dyspnea, is found to have evidence for acute kidney injury, concern for COPD exacerbation, as well as hyper chloremia, similar to multiple prior.  Patient required admission to the stepdown unit, ongoing fluid resuscitation, antibiotics, bronchodilators.  Final Clinical Impression(s) / ED Diagnoses Final diagnoses:  Respiratory distress  AKI (acute kidney injury) ([place])  Acute cystitis without hematuria   CRITICAL CARE Performed by: Carmin Muskrat Total critical care time: 35 minutes Critical care time was exclusive of separately billable procedures and treating other patients. Critical care was necessary to treat or prevent imminent or life-threatening deterioration. Critical care was time spent personally by me  on the following activities: development of treatment plan with patient and/or surrogate as well as nursing, discussions with consultants, evaluation of patient's response to treatment, examination of patient, obtaining history from patient or surrogate, ordering and performing treatments and interventions, ordering and review of laboratory studies, ordering and review of radiographic studies, pulse oximetry and re-evaluation of patient's condition.    Carmin Muskrat, MD 08/23/22 [phone]

## 2022-08-23 NOTE — Progress Notes (Addendum)
[facility] Progress Note Patient Name: Beth Garrison DOB: [birth year] MRN: [id number]   Date of Service  08/23/2022  HPI/Events of Note  Patient with agitated delirium, she keeps pulling her BIPAP mask off and trying to climb out of bed. Saturation 100 %.  eICU Interventions  Zyprexa 5 mg IM x 1 ordered.        Frederik Pear 08/23/2022, 10:43 PM

## 2022-08-23 NOTE — Progress Notes (Signed)
[facility] Physician-Brief Progress Note Patient Name: Beth Garrison DOB: 02-Jan-1941 MRN: [id number]   Date of Service  08/23/2022  HPI/Events of Note  Patient's lactic acid increased from 2.7 to 3.4, patient is on BIPAP, last blood sugar was 156 mg / dl, but she had a Beta Hydroxybutyric acid level of 3.75 this morning, BNP this morning was 39, it is not al all clear what her current volume, acid-base, and ketoacidotic status is at this time.  eICU Interventions  Stat BNP, Beta Hydroxybutyric acid, and VBG ordered to try to better assess her current metabolic, acid-base and volume status, in order to prescribe appropriate treatment. Continue BIPAP pending results.        Frederik Pear 08/23/2022, 9:38 PM

## 2022-08-23 NOTE — Progress Notes (Signed)
Per MD pt placed on bipap due to WOB, 2000 tx given per MD

## 2022-08-23 NOTE — ED Triage Notes (Signed)
Pt arrives via [facility] from home for respiratory distress x 3 days. Pt hypotensive with EMS at 80/40. On continuous neb on arrival. Dr. Vanita Panda to bedside.

## 2022-08-23 NOTE — Consult Note (Signed)
NAME:  Beth Garrison, MRN:  [id number], DOB:  22-Dec-1940, LOS: 0 ADMISSION DATE:  08/23/2022, CONSULTATION DATE:  9/20 REFERRING MD:  Dr. Tennis Must, CHIEF COMPLAINT:  Weakness, SOB   History of Present Illness:  81 y/o F who presented to [facility] on 9/20 with reports of weakness and shortness of breath.  The patient called EMS for a 3 day history of increasing shortness of breath and SOB, vomiting last night.  Confusion as well over the last few days. Her daughter reports the patient smokes and has for many years, but quit approximately 10 years ago.  She has had prior admissions for similar. An episode of vomiting last night.  She has been taking all of her usual medications including farxiga  Here she has been given ceftriaxone with concern for UTI, 3L LR, 1L saline. She has been given prednisone 40 mg and bronchodilators for bronchospasm. PCCM consulted when discovered to have profound acidosis and increased WOB later in the afternoon on day of admission.    Pertinent  Medical History  GERD  Tobacco Abuse - long standing smoking, no formal dx of COPD HTN  HLD DM  EGD   Significant Hospital Events: Including procedures, antibiotic start and stop dates in addition to other pertinent events   9/20 Admit with weakness, SOB.   Interim History / Subjective:    Objective   Blood pressure (!) 109/33, pulse (!) 116, temperature (!) 94.5 F (34.7 C), temperature source Axillary, resp. rate (!) 24, height '5\' 5"'$  (1.651 m), weight 73.3 kg, SpO2 100 %.        Intake/Output Summary (Last 24 hours) at 08/23/2022 1814 Last data filed at 08/23/2022 1437 Gross per 24 hour  Intake --  Output 200 ml  Net -200 ml   Filed Weights   08/23/22 1631  Weight: 73.3 kg    Examination: General appearance: 81 y.o., female,uncomfortable Eyes: PERRL, tracking appropriately HENT: NCAT; dry MM Lungs: upper airway predominant wheeze, increased respiratory effort CV: tachy RR, no murmur  Abdomen: Soft,  non-tender; non-distended, BS present  Extremities: No peripheral edema, warm Skin: Normal turgor and texture; no rash Neuro: not totally attentive, grossly nonfocal   Resolved Hospital Problem list     Assessment & Plan:   # Severe sepsis due to UTI History of frequent uti and admissions for same.  - start zosyn, follow cultures and narrow as able - f/u renal US as below  # Acute hypoxic respiratory failure WOB largely due to acidosis. Smoker raising possibility of COPD and bronchospasm however it really sounds like mostly upper airway wheezing. - start trial of bipap given profound acidosis - bronchodilators - steroids  # Acute metabolic encephalopathy Due to respiratory failure/acidosis - ctm response to resuscitation as below  # AKI  # severe anion gap acidosis Although she does take farxiga, BHB only mildly elevated 3.75 - trend bmet, vbg - f/u renal US - d5/bicarb - avoid nephrotoxins  # DM - basal/ssi   # HTN - hold antihypertensives for now  Best Practice (right click and "Reselect all SmartList Selections" daily)   Diet/type: NPO w/ oral meds DVT prophylaxis: prophylactic heparin  GI prophylaxis: N/A Lines: N/A Foley:  Yes, and it is still needed Code Status:  full code Last date of multidisciplinary goals of care discussion [daughter updated at bedside]  Labs   CBC: Recent Labs  Lab 08/23/22 1056  WBC 12.1*  NEUTROABS 7.9*  HGB 10.8*  HCT 36.0  MCV 94.7  PLT  702    Basic Metabolic Panel: Recent Labs  Lab 08/23/22 1056 08/23/22 1434  NA 145 146*  K 4.4 4.6  CL 129* >130*  CO2 <7* <7*  GLUCOSE 79 157*  BUN 69* 66*  CREATININE 2.72* 2.81*  CALCIUM 8.6* 8.1*   GFR: Estimated Creatinine Clearance: 16.3 mL/min (A) (by C-G formula based on SCr of 2.81 mg/dL (H)). Recent Labs  Lab 08/23/22 1056 08/23/22 1101 08/23/22 1434  WBC 12.1*  --   --   LATICACIDVEN  --  1.4 2.5*    Liver Function Tests: Recent Labs  Lab 08/23/22 1056   AST 18  ALT 18  ALKPHOS 127*  BILITOT 0.6  PROT 6.7  ALBUMIN 3.5   No results for input(s): "LIPASE", "AMYLASE" in the last 168 hours. No results for input(s): "AMMONIA" in the last 168 hours.  ABG    Component Value Date/Time   HCO3 3.2 (L) 08/23/2022 1728   TCO2 20.3 03/05/2010 1335   ACIDBASEDEF 26.9 (H) 08/23/2022 1728   O2SAT 98.4 08/23/2022 1728     Coagulation Profile: Recent Labs  Lab 08/23/22 1056  INR 1.6*    Cardiac Enzymes: No results for input(s): "CKTOTAL", "CKMB", "CKMBINDEX", "TROPONINI" in the last 168 hours.  HbA1C: Hgb A1c MFr Bld  Date/Time Value Ref Range Status  03/27/2021 04:15 AM 6.4 (H) 4.8 - 5.6 % Final    Comment:    (NOTE) Pre diabetes:          5.7%-6.4%  Diabetes:              >6.4%  Glycemic control for   <7.0% adults with diabetes   07/07/2019 08:18 PM 5.5 4.8 - 5.6 % Final    Comment:    (NOTE) Pre diabetes:          5.7%-6.4% Diabetes:              >6.4% Glycemic control for   <7.0% adults with diabetes     CBG: Recent Labs  Lab 08/23/22 1630  GLUCAP 156*    Review of Systems:   Unable to obtain in setting of her acute metabolic encephalopathy  Past Medical History:  She,  has a past medical history of AKI (acute kidney injury) ([place]) (07/07/2019), Diabetes mellitus without complication ([place]), GERD (gastroesophageal reflux disease), Hyperlipidemia, and Hypertension.   Surgical History:   Past Surgical History:  Procedure Laterality Date   BIOPSY  07/10/2019   Procedure: BIOPSY;  Surgeon: Carol Ada, MD;  Location: [facility];  Service: Endoscopy;;   ESOPHAGOGASTRODUODENOSCOPY (EGD) WITH PROPOFOL Left 07/10/2019   Procedure: ESOPHAGOGASTRODUODENOSCOPY (EGD) WITH PROPOFOL;  Surgeon: Carol Ada, MD;  Location: [facility];  Service: Endoscopy;  Laterality: Left;   TONSILLECTOMY AND ADENOIDECTOMY       Social History:   reports that she has never smoked. She has never used smokeless tobacco. She reports  that she does not drink alcohol and does not use drugs.   Family History:  Her family history includes Diabetes in her brother, father, maternal grandmother, mother, and paternal grandmother; Hyperlipidemia in her mother. There is no history of Cancer or Stroke.   Allergies Allergies  Allergen Reactions   Dulaglutide Diarrhea   Gabapentin Diarrhea     Home Medications  Prior to Admission medications   Medication Sig Start Date End Date Taking? Authorizing Provider  amLODipine (NORVASC) 5 MG tablet Take 1 tablet (5 mg total) by mouth daily. 07/17/19   Monica Becton, MD  cefdinir (OMNICEF) 300 MG  capsule Take 1 capsule (300 mg total) by mouth 2 (two) times daily. 03/28/21   Georgette Shell, MD  diclofenac Sodium (VOLTAREN) 1 % GEL Apply 2 g topically 4 (four) times daily. 08/10/22   Varney Biles, MD  FARXIGA 10 MG TABS tablet Take 10 mg by mouth daily. 08/08/22   [provider]  FEROSUL 325 (65 Fe) MG tablet Take 325 mg by mouth daily. 02/21/21   [provider]  glipiZIDE (GLUCOTROL XL) 2.5 MG 24 hr tablet Take 2.5 mg by mouth daily. 03/16/21   [provider]  LEVEMIR FLEXPEN 100 UNIT/ML FlexPen SMARTSIG:10 Unit(s) SUB-Q Twice Daily 08/16/22   [provider]  lisinopril (ZESTRIL) 20 MG tablet Take 20 mg by mouth daily.    [provider]  pantoprazole (PROTONIX) 40 MG tablet Take 1 tablet (40 mg total) by mouth 2 (two) times daily. 07/16/19   Monica Becton, MD  pravastatin (PRAVACHOL) 40 MG tablet Take 1 tablet (40 mg total) by mouth daily. 01/04/15   Janith Lima, MD  pravastatin (PRAVACHOL) 80 MG tablet Take 80 mg by mouth daily. 08/15/22   [provider]  TRESIBA FLEXTOUCH 200 UNIT/ML FlexTouch Pen Inject 10 Units into the skin daily. 11/29/20   [provider]     Critical care time: 35 minutes    Fredir[facility]   08/23/2022, 6:14 PM   Please see [email or web address] for pager  details.   From 7A-7P if no response, please call [phone] After hours, please call ELink [phone]

## 2022-08-23 NOTE — Progress Notes (Signed)
PHARMACY NOTE -  [facility] has been assisting with dosing of Zosyn for urosepsis. Dosage remains stable at 3.375 g IV q12 hr and further renal adjustments per institutional Pharmacy antibiotic protocol  Pharmacy will sign off, following peripherally for culture results or dose adjustments. Please reconsult if a change in clinical status warrants re-evaluation of dosage.  Reuel Boom, PharmD, BCPS [phone] 08/23/2022, 6:56 PM

## 2022-08-24 ENCOUNTER — Observation Stay (HOSPITAL_COMMUNITY): Payer: Medicare Other

## 2022-08-24 DIAGNOSIS — F17200 Nicotine dependence, unspecified, uncomplicated: Secondary | ICD-10-CM | POA: Diagnosis present

## 2022-08-24 DIAGNOSIS — E118 Type 2 diabetes mellitus with unspecified complications: Secondary | ICD-10-CM | POA: Diagnosis not present

## 2022-08-24 DIAGNOSIS — Z6830 Body mass index (BMI) 30.0-30.9, adult: Secondary | ICD-10-CM | POA: Diagnosis not present

## 2022-08-24 DIAGNOSIS — E785 Hyperlipidemia, unspecified: Secondary | ICD-10-CM | POA: Diagnosis present

## 2022-08-24 DIAGNOSIS — N179 Acute kidney failure, unspecified: Secondary | ICD-10-CM | POA: Diagnosis present

## 2022-08-24 DIAGNOSIS — E870 Hyperosmolality and hypernatremia: Secondary | ICD-10-CM | POA: Diagnosis present

## 2022-08-24 DIAGNOSIS — G9341 Metabolic encephalopathy: Secondary | ICD-10-CM | POA: Diagnosis present

## 2022-08-24 DIAGNOSIS — N3000 Acute cystitis without hematuria: Secondary | ICD-10-CM | POA: Diagnosis present

## 2022-08-24 DIAGNOSIS — E669 Obesity, unspecified: Secondary | ICD-10-CM | POA: Diagnosis present

## 2022-08-24 DIAGNOSIS — A419 Sepsis, unspecified organism: Secondary | ICD-10-CM | POA: Diagnosis present

## 2022-08-24 DIAGNOSIS — Z833 Family history of diabetes mellitus: Secondary | ICD-10-CM | POA: Diagnosis not present

## 2022-08-24 DIAGNOSIS — J9811 Atelectasis: Secondary | ICD-10-CM | POA: Diagnosis present

## 2022-08-24 DIAGNOSIS — E1122 Type 2 diabetes mellitus with diabetic chronic kidney disease: Secondary | ICD-10-CM | POA: Diagnosis present

## 2022-08-24 DIAGNOSIS — J9801 Acute bronchospasm: Secondary | ICD-10-CM | POA: Diagnosis present

## 2022-08-24 DIAGNOSIS — R6520 Severe sepsis without septic shock: Secondary | ICD-10-CM | POA: Diagnosis present

## 2022-08-24 DIAGNOSIS — I129 Hypertensive chronic kidney disease with stage 1 through stage 4 chronic kidney disease, or unspecified chronic kidney disease: Secondary | ICD-10-CM | POA: Diagnosis present

## 2022-08-24 DIAGNOSIS — Z794 Long term (current) use of insulin: Secondary | ICD-10-CM | POA: Diagnosis not present

## 2022-08-24 DIAGNOSIS — N1832 Chronic kidney disease, stage 3b: Secondary | ICD-10-CM | POA: Diagnosis present

## 2022-08-24 DIAGNOSIS — J9601 Acute respiratory failure with hypoxia: Secondary | ICD-10-CM | POA: Diagnosis not present

## 2022-08-24 DIAGNOSIS — E1110 Type 2 diabetes mellitus with ketoacidosis without coma: Secondary | ICD-10-CM | POA: Diagnosis present

## 2022-08-24 DIAGNOSIS — N390 Urinary tract infection, site not specified: Secondary | ICD-10-CM | POA: Diagnosis not present

## 2022-08-24 DIAGNOSIS — Z83438 Family history of other disorder of lipoprotein metabolism and other lipidemia: Secondary | ICD-10-CM | POA: Diagnosis not present

## 2022-08-24 DIAGNOSIS — D631 Anemia in chronic kidney disease: Secondary | ICD-10-CM | POA: Diagnosis present

## 2022-08-24 DIAGNOSIS — D696 Thrombocytopenia, unspecified: Secondary | ICD-10-CM | POA: Diagnosis present

## 2022-08-24 DIAGNOSIS — E872 Acidosis, unspecified: Secondary | ICD-10-CM

## 2022-08-24 DIAGNOSIS — L89301 Pressure ulcer of unspecified buttock, stage 1: Secondary | ICD-10-CM | POA: Diagnosis present

## 2022-08-24 DIAGNOSIS — E440 Moderate protein-calorie malnutrition: Secondary | ICD-10-CM | POA: Diagnosis present

## 2022-08-24 LAB — BASIC METABOLIC PANEL
Anion gap: 10 (ref 5–15)
Anion gap: 10 (ref 5–15)
Anion gap: 8 (ref 5–15)
Anion gap: 10 (ref 5–15)
BUN: 65 mg/dL — ABNORMAL HIGH (ref 8–23)
BUN: 63 mg/dL — ABNORMAL HIGH (ref 8–23)
BUN: 60 mg/dL — ABNORMAL HIGH (ref 8–23)
BUN: 58 mg/dL — ABNORMAL HIGH (ref 8–23)
BUN: 59 mg/dL — ABNORMAL HIGH (ref 8–23)
CO2: <7 mmol/L — ABNORMAL LOW (ref 22–32)
CO2: 8 mmol/L — ABNORMAL LOW (ref 22–32)
CO2: 9 mmol/L — ABNORMAL LOW (ref 22–32)
CO2: 12 mmol/L — ABNORMAL LOW (ref 22–32)
CO2: 14 mmol/L — ABNORMAL LOW (ref 22–32)
Calcium: 8.1 mg/dL — ABNORMAL LOW (ref 8.9–10.3)
Calcium: 8 mg/dL — ABNORMAL LOW (ref 8.9–10.3)
Calcium: 8.1 mg/dL — ABNORMAL LOW (ref 8.9–10.3)
Calcium: 8.3 mg/dL — ABNORMAL LOW (ref 8.9–10.3)
Calcium: 8 mg/dL — ABNORMAL LOW (ref 8.9–10.3)
Chloride: 129 mmol/L — ABNORMAL HIGH (ref 98–111)
Chloride: 129 mmol/L — ABNORMAL HIGH (ref 98–111)
Chloride: 125 mmol/L — ABNORMAL HIGH (ref 98–111)
Chloride: 125 mmol/L — ABNORMAL HIGH (ref 98–111)
Chloride: 124 mmol/L — ABNORMAL HIGH (ref 98–111)
Creatinine, Ser: 2.77 mg/dL — ABNORMAL HIGH (ref 0.44–1.00)
Creatinine, Ser: 2.64 mg/dL — ABNORMAL HIGH (ref 0.44–1.00)
Creatinine, Ser: 2.46 mg/dL — ABNORMAL HIGH (ref 0.44–1.00)
Creatinine, Ser: 2.48 mg/dL — ABNORMAL HIGH (ref 0.44–1.00)
Creatinine, Ser: 2.3 mg/dL — ABNORMAL HIGH (ref 0.44–1.00)
GFR, Estimated: 17 mL/min — ABNORMAL LOW (ref >60)
GFR, Estimated: 18 mL/min — ABNORMAL LOW (ref >60)
GFR, Estimated: 19 mL/min — ABNORMAL LOW (ref >60)
GFR, Estimated: 19 mL/min — ABNORMAL LOW (ref >60)
GFR, Estimated: 21 mL/min — ABNORMAL LOW (ref >60)
Glucose, Bld: 268 mg/dL — ABNORMAL HIGH (ref 70–99)
Glucose, Bld: 149 mg/dL — ABNORMAL HIGH (ref 70–99)
Glucose, Bld: 170 mg/dL — ABNORMAL HIGH (ref 70–99)
Glucose, Bld: 176 mg/dL — ABNORMAL HIGH (ref 70–99)
Glucose, Bld: 205 mg/dL — ABNORMAL HIGH (ref 70–99)
Potassium: 4.3 mmol/L (ref 3.5–5.1)
Potassium: 3.9 mmol/L (ref 3.5–5.1)
Potassium: 3.8 mmol/L (ref 3.5–5.1)
Potassium: 3.6 mmol/L (ref 3.5–5.1)
Potassium: 3.3 mmol/L — ABNORMAL LOW (ref 3.5–5.1)
Sodium: 149 mmol/L — ABNORMAL HIGH (ref 135–145)
Sodium: 147 mmol/L — ABNORMAL HIGH (ref 135–145)
Sodium: 144 mmol/L (ref 135–145)
Sodium: 145 mmol/L (ref 135–145)
Sodium: 148 mmol/L — ABNORMAL HIGH (ref 135–145)

## 2022-08-24 LAB — GLUCOSE, CAPILLARY
Glucose-Capillary: 238 mg/dL — ABNORMAL HIGH (ref 70–99)
Glucose-Capillary: 214 mg/dL — ABNORMAL HIGH (ref 70–99)
Glucose-Capillary: 172 mg/dL — ABNORMAL HIGH (ref 70–99)
Glucose-Capillary: 148 mg/dL — ABNORMAL HIGH (ref 70–99)
Glucose-Capillary: 147 mg/dL — ABNORMAL HIGH (ref 70–99)
Glucose-Capillary: 120 mg/dL — ABNORMAL HIGH (ref 70–99)
Glucose-Capillary: 133 mg/dL — ABNORMAL HIGH (ref 70–99)
Glucose-Capillary: 137 mg/dL — ABNORMAL HIGH (ref 70–99)
Glucose-Capillary: 131 mg/dL — ABNORMAL HIGH (ref 70–99)
Glucose-Capillary: 151 mg/dL — ABNORMAL HIGH (ref 70–99)
Glucose-Capillary: 154 mg/dL — ABNORMAL HIGH (ref 70–99)
Glucose-Capillary: 141 mg/dL — ABNORMAL HIGH (ref 70–99)
Glucose-Capillary: 160 mg/dL — ABNORMAL HIGH (ref 70–99)
Glucose-Capillary: 138 mg/dL — ABNORMAL HIGH (ref 70–99)
Glucose-Capillary: 144 mg/dL — ABNORMAL HIGH (ref 70–99)
Glucose-Capillary: 152 mg/dL — ABNORMAL HIGH (ref 70–99)
Glucose-Capillary: 164 mg/dL — ABNORMAL HIGH (ref 70–99)
Glucose-Capillary: 169 mg/dL — ABNORMAL HIGH (ref 70–99)

## 2022-08-24 LAB — COMPREHENSIVE METABOLIC PANEL
ALT: 20 U/L (ref 0–44)
AST: 32 U/L (ref 15–41)
Albumin: 2.8 g/dL — ABNORMAL LOW (ref 3.5–5.0)
Alkaline Phosphatase: 95 U/L (ref 38–126)
Anion gap: 10 (ref 5–15)
BUN: 65 mg/dL — ABNORMAL HIGH (ref 8–23)
CO2: 9 mmol/L — ABNORMAL LOW (ref 22–32)
Calcium: 8.1 mg/dL — ABNORMAL LOW (ref 8.9–10.3)
Chloride: 130 mmol/L — ABNORMAL HIGH (ref 98–111)
Creatinine, Ser: 2.73 mg/dL — ABNORMAL HIGH (ref 0.44–1.00)
GFR, Estimated: 17 mL/min — ABNORMAL LOW (ref >60)
Glucose, Bld: 158 mg/dL — ABNORMAL HIGH (ref 70–99)
Potassium: 4 mmol/L (ref 3.5–5.1)
Sodium: 149 mmol/L — ABNORMAL HIGH (ref 135–145)
Total Bilirubin: 0.8 mg/dL (ref 0.3–1.2)
Total Protein: 5.3 g/dL — ABNORMAL LOW (ref 6.5–8.1)

## 2022-08-24 LAB — CBC
HCT: 26.7 % — ABNORMAL LOW (ref 36.0–46.0)
Hemoglobin: 8.3 g/dL — ABNORMAL LOW (ref 12.0–15.0)
MCH: 28.8 pg (ref 26.0–34.0)
MCHC: 31.1 g/dL (ref 30.0–36.0)
MCV: 92.7 fL (ref 80.0–100.0)
Platelets: 148 10*3/uL — ABNORMAL LOW (ref 150–400)
RBC: 2.88 MIL/uL — ABNORMAL LOW (ref 3.87–5.11)
RDW: 15.6 % — ABNORMAL HIGH (ref 11.5–15.5)
WBC: 10 10*3/uL (ref 4.0–10.5)
nRBC: 0 % (ref 0.0–0.2)

## 2022-08-24 LAB — BETA-HYDROXYBUTYRIC ACID
Beta-Hydroxybutyric Acid: 6.73 mmol/L — ABNORMAL HIGH (ref 0.05–0.27)
Beta-Hydroxybutyric Acid: 3.66 mmol/L — ABNORMAL HIGH (ref 0.05–0.27)
Beta-Hydroxybutyric Acid: 4.1 mmol/L — ABNORMAL HIGH (ref 0.05–0.27)
Beta-Hydroxybutyric Acid: 2.84 mmol/L — ABNORMAL HIGH (ref 0.05–0.27)

## 2022-08-24 LAB — BLOOD GAS, VENOUS
Acid-base deficit: 23.2 mmol/L — ABNORMAL HIGH (ref 0.0–2.0)
Bicarbonate: 4.6 mmol/L — ABNORMAL LOW (ref 20.0–28.0)
Drawn by: 355291
O2 Saturation: 82.6 %
Patient temperature: 36.6
pCO2, Ven: <18 mmHg — CL (ref 44–60)
pH, Ven: 7.09 — CL (ref 7.25–7.43)
pO2, Ven: 50 mmHg — ABNORMAL HIGH (ref 32–45)

## 2022-08-24 LAB — MAGNESIUM: Magnesium: 1.8 mg/dL (ref 1.7–2.4)

## 2022-08-24 MED ORDER — LACTATED RINGERS IV BOLUS
1000.0000 mL | Freq: Once | INTRAVENOUS | Status: AC
  Administered 2022-08-24: 1000 mL via INTRAVENOUS

## 2022-08-24 MED ORDER — SODIUM BICARBONATE 8.4 % IV SOLN
INTRAVENOUS | Status: AC
  Filled 2022-08-24 (×2): qty 150

## 2022-08-24 MED ORDER — POTASSIUM CHLORIDE 10 MEQ/100ML IV SOLN
10.0000 meq | INTRAVENOUS | Status: AC
  Administered 2022-08-24 (×2): 10 meq via INTRAVENOUS
  Filled 2022-08-24 (×2): qty 100

## 2022-08-24 MED ORDER — LACTATED RINGERS IV SOLN: INTRAVENOUS | Status: DC

## 2022-08-24 MED ORDER — INSULIN REGULAR(HUMAN) IN NACL 100-0.9 UT/100ML-% IV SOLN
INTRAVENOUS | Status: DC
  Administered 2022-08-24: 7.5 [IU]/h via INTRAVENOUS
  Filled 2022-08-24: qty 100

## 2022-08-24 MED ORDER — STERILE WATER FOR INJECTION IJ SOLN
INTRAMUSCULAR | Status: AC
  Filled 2022-08-24: qty 10

## 2022-08-24 MED ORDER — OLANZAPINE 10 MG IM SOLR
5.0000 mg | Freq: Once | INTRAMUSCULAR | Status: AC
  Administered 2022-08-24: 5 mg via INTRAMUSCULAR
  Filled 2022-08-24: qty 10

## 2022-08-24 MED ORDER — LACTATED RINGERS IV BOLUS
20.0000 mL/kg | Freq: Once | INTRAVENOUS | Status: AC
  Administered 2022-08-24: 1466 mL via INTRAVENOUS

## 2022-08-24 MED ORDER — OLANZAPINE 10 MG IM SOLR
2.5000 mg | Freq: Once | INTRAMUSCULAR | Status: AC | PRN
  Filled 2022-08-24: qty 10

## 2022-08-24 MED ORDER — DEXTROSE IN LACTATED RINGERS 5 % IV SOLN: INTRAVENOUS | Status: DC

## 2022-08-24 MED ORDER — DEXTROSE 50 % IV SOLN: 0.0000 mL | INTRAVENOUS | Status: DC | PRN

## 2022-08-24 NOTE — Progress Notes (Signed)
[facility] Progress Note Patient Name: Leita Lindbloom DOB: [birth year] MRN: [id number]   Date of Service  08/24/2022  HPI/Events of Note  Patient needs additional access for scheduled medications to be administered, she has one IV and the vascular team was unable to insert an additional iv.  eICU Interventions  PCCM ground crew requested to insert a central line.        Kerry Kass Chihiro Frey 08/24/2022, 2:43 AM

## 2022-08-24 NOTE — Progress Notes (Signed)
[facility] Progress Note Patient Name: Beth Garrison DOB: Oct 19, 1941 MRN: [id number]   Date of Service  08/24/2022  HPI/Events of Note  Patient becoming restless again.  eICU Interventions  Zyprexa 2.5 mg IM x 1 PRN extreme agitation only ordered, this was communicated to the bedside RN via phone.        Kerry Kass Kabir Brannock 08/24/2022, 5:34 AM

## 2022-08-24 NOTE — Procedures (Signed)
Central Venous Catheter Insertion Procedure Note  Bassheva Flury  [id number]  November 01, 1941  Date:08/24/22  Time:4:07 AM   Provider Performing:Daric Koren R Riona Lahti   Procedure: Insertion of Non-tunneled Central Venous Catheter(36556) with US guidance (50277)   Indication(s) Medication administration  Consent Unable to obtain consent due to emergent nature of procedure.  Anesthesia Topical only with 1% lidocaine   Timeout Verified patient identification, verified procedure, site/side was marked, verified correct patient position, special equipment/implants available, medications/allergies/relevant history reviewed, required imaging and test results available.  Sterile Technique Maximal sterile technique including full sterile barrier drape, hand hygiene, sterile gown, sterile gloves, mask, hair covering, sterile ultrasound probe cover (if used).  Procedure Description Area of catheter insertion was cleaned with chlorhexidine and draped in sterile fashion.  With real-time ultrasound guidance a central venous catheter was placed into the right internal jugular vein. Nonpulsatile blood flow and easy flushing noted in all ports.  The catheter was sutured in place and sterile dressing applied.  Complications/Tolerance None; patient tolerated the procedure well. Chest X-ray is ordered to verify placement for internal jugular or subclavian cannulation.   Chest x-ray is not ordered for femoral cannulation.  EBL Minimal  Specimen(s) None   Otilio Carpen Shandora Koogler, PA-C

## 2022-08-24 NOTE — Progress Notes (Signed)
[facility] Progress Note Patient Name: Monika Chestang DOB: 1941/05/27 MRN: [id number]   Date of Service  08/24/2022  HPI/Events of Note  Labs consistent with profound DKA with volume depletion.  eICU Interventions  DKA protocol ordered.        Kerry Kass Jaquelyne Firkus 08/24/2022, 1:47 AM

## 2022-08-24 NOTE — Progress Notes (Signed)
Critical result called to RN by lab.   Critical result VBG pH: 7.00 C02: <17  Dr. Olevia Bowens notified of results. STAT orders placed Consulted to CCM

## 2022-08-24 NOTE — Progress Notes (Signed)
NAME:  Beth Garrison, MRN:  [id number], DOB:  January 27, 1941, LOS: 0 ADMISSION DATE:  08/23/2022, CONSULTATION DATE:  9/20 REFERRING MD:  Dr. Tennis Must, CHIEF COMPLAINT:  Weakness, SOB   History of Present Illness:  81 y/o F who presented to [facility] on 9/20 with reports of weakness and shortness of breath.  The patient called EMS for a 3 day history of increasing shortness of breath and SOB, vomiting last night.  Confusion as well over the last few days. Her daughter reports the patient smokes and has for many years, but quit approximately 10 years ago.  She has had prior admissions for similar. An episode of vomiting evening prior to admit.   She has been taking all of her usual medications including farxiga.  Here she has been given ceftriaxone with concern for UTI, 3L LR, 1L saline. She has been given prednisone 40 mg and bronchodilators for bronchospasm. PCCM consulted when discovered to have profound AG acidosis and increased WOB later in the afternoon on day of admission.    Pertinent  Medical History  GERD  Tobacco Abuse - long standing smoking, no formal dx of COPD HTN  HLD DM  EGD   Significant Hospital Events: Including procedures, antibiotic start and stop dates in addition to other pertinent events   9/20 Admit with weakness, SOB 9/21 On BiPAP, insulin gtt   Interim History / Subjective:  RN reports pt started on insulin infusion overnight  I/O 200, +1.3L in last 24 hours  On BiPAP, 30% FiO2   Objective   Blood pressure (!) 160/99, pulse (!) 109, temperature 97.9 F (36.6 C), temperature source Axillary, resp. rate 16, height '5\' 5"'$  (1.651 m), weight 73.3 kg, SpO2 100 %.    FiO2 (%):  [30 %-40 %] 30 %   Intake/Output Summary (Last 24 hours) at 08/24/2022 6314 Last data filed at 08/24/2022 0800 Gross per 24 hour  Intake 2199.49 ml  Output 200 ml  Net 1999.49 ml   Filed Weights   08/23/22 1631  Weight: 73.3 kg    Examination: General: elderly female lying in bed in  NAD on bipap    HEENT: MM pink/dry, bipap mask in place, pupils equal/reactive Neuro: awakens to voice, states good morning, otherwise very slow to answer questions CV: s1s2 RRR, no m/r/g PULM: non-labored at rest, lungs bilaterally with referred upper airway wheeze when off bipap, resolves while on BiPAP GI: soft, bsx4 active  Extremities: warm/dry, no edema  Skin: no rashes or lesions  Corrected AG = 13 Cr 2.73, Mg 1.8 / K 4 PCXR - R IJ TLC in good position, possible effusion on right   Resolved Hospital Problem list     Assessment & Plan:   Severe Sepsis due to UTI History of frequent uti and admissions for same.  -continue zosyn  -assess UC  -renal US negative for hydro, suggestive of medical renal disease, small left renal cyst   Acute Hypoxic Respiratory Failure WOB largely due to acidosis. Smoker raising possibility of COPD and bronchospasm however it really sounds like mostly upper airway wheezing.  RVP negative.  -intermittent bipap  -continue bronchodilators  -IV solumedrol  -pulmonary hygiene -IS, mobilize   Acute Metabolic Encephalopathy Due to respiratory failure/acidosis -avoid sedating medications  -correct underlying metabolic derangements  -PT efforts when able  AKI  Severe anion gap acidosis Although she does take farxiga, BHB only mildly elevated 3.75 -follow BMP  -Trend BMP / urinary output -Replace electrolytes as indicated -Avoid nephrotoxic agents,  ensure adequate renal perfusion  DM with concern for DKA Beta hydroxy increased overnight  -insulin infusion per DKA protocol  -IVF per protocol  -should not go back on Farxiga as outpatient given recurrent UTI, admits for same  HTN -hold home antihypertensives   Best Practice (right click and "Reselect all SmartList Selections" daily)  Diet/type: NPO w/ oral meds DVT prophylaxis: prophylactic heparin  GI prophylaxis: N/A Lines: N/A Foley:  Yes, and it is still needed Code Status:  full  code Last date of multidisciplinary goals of care discussion: 9/21, daughter confirmed full code for short term support. Would not want prolonged life sustaining measures.   Critical care time: 34 minutes    Noe Gens, MSN, APRN, NP-C, AGACNP-BC [facility] 08/24/2022, 8:42 AM   Please see [email or web address] for pager details.   From 7A-7P if no response, please call [phone] After hours, please call ELink [phone]

## 2022-08-24 NOTE — Progress Notes (Signed)
Pt seen and given scheduled nebulizer treatment which she tolerated well.  Pt found on 4l [place], HR 103, rr19, spo2 99%, no increased wob or respiratory distress noted.  Bipap remains in room on standby but not indicated at this time.

## 2022-08-24 NOTE — Progress Notes (Addendum)
Dear Doctor: This patient has been identified as a candidate for Central line placement for the following reason (s): poor veins/poor circulatory system (CHF, COPD, emphysema, diabetes, steroid use, IV drug abuse, etc.) and incompatible drugs (aminophyllin, TPN, heparin, given with an antibiotic). GFR of 17 .

## 2022-08-25 DIAGNOSIS — R6520 Severe sepsis without septic shock: Secondary | ICD-10-CM | POA: Diagnosis not present

## 2022-08-25 DIAGNOSIS — E440 Moderate protein-calorie malnutrition: Secondary | ICD-10-CM | POA: Insufficient documentation

## 2022-08-25 DIAGNOSIS — A419 Sepsis, unspecified organism: Secondary | ICD-10-CM | POA: Diagnosis not present

## 2022-08-25 LAB — BASIC METABOLIC PANEL
Anion gap: 7 (ref 5–15)
Anion gap: 7 (ref 5–15)
Anion gap: 9 (ref 5–15)
Anion gap: 8 (ref 5–15)
BUN: 54 mg/dL — ABNORMAL HIGH (ref 8–23)
BUN: 52 mg/dL — ABNORMAL HIGH (ref 8–23)
BUN: 46 mg/dL — ABNORMAL HIGH (ref 8–23)
BUN: 47 mg/dL — ABNORMAL HIGH (ref 8–23)
CO2: 18 mmol/L — ABNORMAL LOW (ref 22–32)
CO2: 20 mmol/L — ABNORMAL LOW (ref 22–32)
CO2: 22 mmol/L (ref 22–32)
CO2: 22 mmol/L (ref 22–32)
Calcium: 8 mg/dL — ABNORMAL LOW (ref 8.9–10.3)
Calcium: 8.1 mg/dL — ABNORMAL LOW (ref 8.9–10.3)
Calcium: 7.9 mg/dL — ABNORMAL LOW (ref 8.9–10.3)
Calcium: 7.6 mg/dL — ABNORMAL LOW (ref 8.9–10.3)
Chloride: 122 mmol/L — ABNORMAL HIGH (ref 98–111)
Chloride: 120 mmol/L — ABNORMAL HIGH (ref 98–111)
Chloride: 116 mmol/L — ABNORMAL HIGH (ref 98–111)
Chloride: 114 mmol/L — ABNORMAL HIGH (ref 98–111)
Creatinine, Ser: 2.22 mg/dL — ABNORMAL HIGH (ref 0.44–1.00)
Creatinine, Ser: 2.1 mg/dL — ABNORMAL HIGH (ref 0.44–1.00)
Creatinine, Ser: 1.87 mg/dL — ABNORMAL HIGH (ref 0.44–1.00)
Creatinine, Ser: 1.94 mg/dL — ABNORMAL HIGH (ref 0.44–1.00)
GFR, Estimated: 22 mL/min — ABNORMAL LOW (ref >60)
GFR, Estimated: 24 mL/min — ABNORMAL LOW (ref >60)
GFR, Estimated: 27 mL/min — ABNORMAL LOW (ref >60)
GFR, Estimated: 26 mL/min — ABNORMAL LOW (ref >60)
Glucose, Bld: 184 mg/dL — ABNORMAL HIGH (ref 70–99)
Glucose, Bld: 172 mg/dL — ABNORMAL HIGH (ref 70–99)
Glucose, Bld: 185 mg/dL — ABNORMAL HIGH (ref 70–99)
Glucose, Bld: 261 mg/dL — ABNORMAL HIGH (ref 70–99)
Potassium: 3.2 mmol/L — ABNORMAL LOW (ref 3.5–5.1)
Potassium: 3.7 mmol/L (ref 3.5–5.1)
Potassium: 3.4 mmol/L — ABNORMAL LOW (ref 3.5–5.1)
Potassium: 3.9 mmol/L (ref 3.5–5.1)
Sodium: 147 mmol/L — ABNORMAL HIGH (ref 135–145)
Sodium: 147 mmol/L — ABNORMAL HIGH (ref 135–145)
Sodium: 147 mmol/L — ABNORMAL HIGH (ref 135–145)
Sodium: 144 mmol/L (ref 135–145)

## 2022-08-25 LAB — GLUCOSE, CAPILLARY
Glucose-Capillary: 147 mg/dL — ABNORMAL HIGH (ref 70–99)
Glucose-Capillary: 165 mg/dL — ABNORMAL HIGH (ref 70–99)
Glucose-Capillary: 182 mg/dL — ABNORMAL HIGH (ref 70–99)
Glucose-Capillary: 183 mg/dL — ABNORMAL HIGH (ref 70–99)
Glucose-Capillary: 187 mg/dL — ABNORMAL HIGH (ref 70–99)
Glucose-Capillary: 173 mg/dL — ABNORMAL HIGH (ref 70–99)
Glucose-Capillary: 182 mg/dL — ABNORMAL HIGH (ref 70–99)
Glucose-Capillary: 168 mg/dL — ABNORMAL HIGH (ref 70–99)
Glucose-Capillary: 158 mg/dL — ABNORMAL HIGH (ref 70–99)
Glucose-Capillary: 197 mg/dL — ABNORMAL HIGH (ref 70–99)
Glucose-Capillary: 146 mg/dL — ABNORMAL HIGH (ref 70–99)
Glucose-Capillary: 147 mg/dL — ABNORMAL HIGH (ref 70–99)
Glucose-Capillary: 157 mg/dL — ABNORMAL HIGH (ref 70–99)
Glucose-Capillary: 161 mg/dL — ABNORMAL HIGH (ref 70–99)
Glucose-Capillary: 166 mg/dL — ABNORMAL HIGH (ref 70–99)
Glucose-Capillary: 270 mg/dL — ABNORMAL HIGH (ref 70–99)

## 2022-08-25 LAB — BETA-HYDROXYBUTYRIC ACID
Beta-Hydroxybutyric Acid: 1.35 mmol/L — ABNORMAL HIGH (ref 0.05–0.27)
Beta-Hydroxybutyric Acid: 0.67 mmol/L — ABNORMAL HIGH (ref 0.05–0.27)

## 2022-08-25 LAB — CBC
HCT: 22.8 % — ABNORMAL LOW (ref 36.0–46.0)
Hemoglobin: 7.4 g/dL — ABNORMAL LOW (ref 12.0–15.0)
MCH: 28.1 pg (ref 26.0–34.0)
MCHC: 32.5 g/dL (ref 30.0–36.0)
MCV: 86.7 fL (ref 80.0–100.0)
Platelets: 146 10*3/uL — ABNORMAL LOW (ref 150–400)
RBC: 2.63 MIL/uL — ABNORMAL LOW (ref 3.87–5.11)
RDW: 15.1 % (ref 11.5–15.5)
WBC: 15.7 10*3/uL — ABNORMAL HIGH (ref 4.0–10.5)
nRBC: 0.3 % — ABNORMAL HIGH (ref 0.0–0.2)

## 2022-08-25 LAB — COMPREHENSIVE METABOLIC PANEL
ALT: 20 U/L (ref 0–44)
AST: 39 U/L (ref 15–41)
Albumin: 2.5 g/dL — ABNORMAL LOW (ref 3.5–5.0)
Alkaline Phosphatase: 80 U/L (ref 38–126)
Anion gap: 5 (ref 5–15)
BUN: 54 mg/dL — ABNORMAL HIGH (ref 8–23)
CO2: 20 mmol/L — ABNORMAL LOW (ref 22–32)
Calcium: 8.1 mg/dL — ABNORMAL LOW (ref 8.9–10.3)
Chloride: 122 mmol/L — ABNORMAL HIGH (ref 98–111)
Creatinine, Ser: 2.29 mg/dL — ABNORMAL HIGH (ref 0.44–1.00)
GFR, Estimated: 21 mL/min — ABNORMAL LOW (ref >60)
Glucose, Bld: 193 mg/dL — ABNORMAL HIGH (ref 70–99)
Potassium: 3.6 mmol/L (ref 3.5–5.1)
Sodium: 147 mmol/L — ABNORMAL HIGH (ref 135–145)
Total Bilirubin: 0.5 mg/dL (ref 0.3–1.2)
Total Protein: 4.6 g/dL — ABNORMAL LOW (ref 6.5–8.1)

## 2022-08-25 LAB — URINE CULTURE: Culture: <10000 — AB

## 2022-08-25 LAB — MAGNESIUM: Magnesium: 1.3 mg/dL — ABNORMAL LOW (ref 1.7–2.4)

## 2022-08-25 MED ORDER — POTASSIUM CHLORIDE 10 MEQ/50ML IV SOLN
10.0000 meq | INTRAVENOUS | Status: AC
  Administered 2022-08-25 (×4): 10 meq via INTRAVENOUS
  Filled 2022-08-25 (×4): qty 50

## 2022-08-25 MED ORDER — GLUCERNA SHAKE PO LIQD
237.0000 mL | Freq: Three times a day (TID) | ORAL | Status: DC
  Administered 2022-08-25 – 2022-08-30 (×13): 237 mL via ORAL
  Filled 2022-08-25 (×17): qty 237

## 2022-08-25 MED ORDER — PIPERACILLIN-TAZOBACTAM 3.375 G IVPB
3.3750 g | Freq: Three times a day (TID) | INTRAVENOUS | Status: DC
  Administered 2022-08-25 – 2022-08-26 (×4): 3.375 g via INTRAVENOUS
  Filled 2022-08-25 (×4): qty 50

## 2022-08-25 MED ORDER — DEXTROSE IN LACTATED RINGERS 5 % IV SOLN: INTRAVENOUS | Status: DC

## 2022-08-25 MED ORDER — PHENYLEPHRINE HCL-NACL 20-0.9 MG/250ML-% IV SOLN: 25.0000 ug/min | INTRAVENOUS | Status: DC

## 2022-08-25 MED ORDER — INSULIN GLARGINE-YFGN 100 UNIT/ML ~~LOC~~ SOLN
8.0000 [IU] | Freq: Every day | SUBCUTANEOUS | Status: DC
  Administered 2022-08-25 – 2022-08-30 (×6): 8 [IU] via SUBCUTANEOUS
  Filled 2022-08-25 (×6): qty 0.08

## 2022-08-25 MED ORDER — INSULIN ASPART 100 UNIT/ML IJ SOLN
0.0000 [IU] | Freq: Three times a day (TID) | INTRAMUSCULAR | Status: DC
  Administered 2022-08-25 – 2022-08-27 (×5): 2 [IU] via SUBCUTANEOUS
  Administered 2022-08-27 – 2022-08-28 (×2): 3 [IU] via SUBCUTANEOUS
  Administered 2022-08-28: 5 [IU] via SUBCUTANEOUS
  Administered 2022-08-28: 1 [IU] via SUBCUTANEOUS
  Administered 2022-08-29: 2 [IU] via SUBCUTANEOUS
  Administered 2022-08-29: 1 [IU] via SUBCUTANEOUS
  Administered 2022-08-29: 3 [IU] via SUBCUTANEOUS
  Administered 2022-08-30: 2 [IU] via SUBCUTANEOUS

## 2022-08-25 MED ORDER — MAGNESIUM SULFATE 2 GM/50ML IV SOLN
2.0000 g | Freq: Once | INTRAVENOUS | Status: AC
  Administered 2022-08-25: 2 g via INTRAVENOUS
  Filled 2022-08-25: qty 50

## 2022-08-25 MED ORDER — SODIUM CHLORIDE 0.9 % IV SOLN
250.0000 mL | INTRAVENOUS | Status: DC
  Administered 2022-08-26: 250 mL via INTRAVENOUS

## 2022-08-25 MED ORDER — ADULT MULTIVITAMIN W/MINERALS CH
1.0000 | ORAL_TABLET | Freq: Every day | ORAL | Status: DC
  Administered 2022-08-25 – 2022-08-30 (×6): 1 via ORAL
  Filled 2022-08-25 (×6): qty 1

## 2022-08-25 MED ORDER — INSULIN ASPART 100 UNIT/ML IJ SOLN
0.0000 [IU] | Freq: Every day | INTRAMUSCULAR | Status: DC
  Administered 2022-08-25: 3 [IU] via SUBCUTANEOUS
  Administered 2022-08-27: 2 [IU] via SUBCUTANEOUS

## 2022-08-25 NOTE — Progress Notes (Signed)
NAME:  Beth Garrison, MRN:  [id number], DOB:  24-Feb-1941, LOS: 1 ADMISSION DATE:  08/23/2022, CONSULTATION DATE:  9/20 REFERRING MD:  Dr. Tennis Must, CHIEF COMPLAINT:  Weakness, SOB   History of Present Illness:  81 y/o F who presented to [facility] on 9/20 with reports of weakness and shortness of breath.  The patient called EMS for a 3 day history of increasing shortness of breath and SOB, vomiting last night.  Confusion as well over the last few days. Her daughter reports the patient smokes and has for many years, but quit approximately 10 years ago.  She has had prior admissions for similar. An episode of vomiting evening prior to admit.   She has been taking all of her usual medications including farxiga.  Here she has been given ceftriaxone with concern for UTI, 3L LR, 1L saline. She has been given prednisone 40 mg and bronchodilators for bronchospasm. PCCM consulted when discovered to have profound AG acidosis and increased WOB later in the afternoon on day of admission.    Pertinent  Medical History  GERD  Tobacco Abuse - long standing smoking, no formal dx of COPD HTN  HLD DM  EGD   Significant Hospital Events: Including procedures, antibiotic start and stop dates in addition to other pertinent events   9/20 Admit with weakness, SOB 9/21 On BiPAP, insulin gtt   Interim History / Subjective:  Afebrile / WBC 15.7 Phenylephrine infusion initiated overnight, K+ replaced [place] 4L  I/O 1.2L UOP, +2.3L in last 24 hours  Pt denies pain, SOB.    Objective   Blood pressure 108/89, pulse 90, temperature 98 F (36.7 C), temperature source Oral, resp. rate 16, height '5\' 5"'$  (1.651 m), weight 73.3 kg, SpO2 99 %.        Intake/Output Summary (Last 24 hours) at 08/25/2022 6063 Last data filed at 08/25/2022 0500 Gross per 24 hour  Intake 3571.03 ml  Output 1250 ml  Net 2321.03 ml   Filed Weights   08/23/22 1631  Weight: 73.3 kg    Examination: General:  elderly adult female lying in  bed in NAD HEENT: MM pink/dry, pupils =/reactive, anicteric  Neuro: Awake, alert, speech clear, answers questions, generalized weakness but follows commands  CV: s1s2 RRR, no m/r/g PULM: non-labored at rest, on RA, lungs bilaterally clear with good air entry  GI: soft, bsx4 active  Extremities: warm/dry, trace dependent edema  Skin: no rashes or lesions  Resolved Hospital Problem list     Assessment & Plan:   Severe Sepsis due to UTI History of frequent uti and admissions for same. Renal US negative for hydro, suggestive of medical renal disease, small left renal cyst  -continue zosyn -follow up UC -? If she should go back on farxiga   Acute Hypoxic Respiratory Failure Possible AECOPD / Upper Airway Wheeze  WOB largely due to acidosis. Smoker raising possibility of COPD and bronchospasm however it really sounds like mostly upper airway wheezing.  RVP negative.  -work of breathing significantly improved with correction of metabolic acidosis, no further bipap  -pulmonary hygiene -IS, mobilize -wean O2 for sats >01%  Acute Metabolic Encephalopathy Due to respiratory failure/acidosis -improving slowly, avoid sedating medications -correct underlying metabolic derangements  -PT when able   AKI  Severe anion gap acidosis Although she does take farxiga, BHB only mildly elevated -Trend BMP / urinary output -Replace electrolytes as indicated -Avoid nephrotoxic agents, ensure adequate renal perfusion  DM with concern for Euglycemic DKA Beta hydroxy increased overnight  -  insulin infusion for now -stop sodium bicarbonate at noon, change to LR per protocol  -pending follow up labs, may be able to transition off insulin infusion this pm -likely should not go back on Farxiga given recurrent UTI's, admits for same   HTN -hold home antihypertensives    Best Practice (right click and "Reselect all SmartList Selections" daily)  Diet/type: NPO w/ oral meds DVT prophylaxis: prophylactic  heparin  GI prophylaxis: N/A Lines: N/A Foley:  Yes, and it is still needed Code Status:  full code Last date of multidisciplinary goals of care discussion: 9/21, daughter confirmed full code for short term support. Would not want prolonged life sustaining measures.    Critical care time: 31 minutes    Noe Gens, MSN, APRN, NP-C, AGACNP-BC [facility] 08/25/2022, 7:42 AM   Please see [email or web address] for pager details.   From 7A-7P if no response, please call [phone] After hours, please call ELink [phone]

## 2022-08-25 NOTE — Progress Notes (Signed)
[facility] Progress Note Patient Name: Beth Garrison DOB: 08/04/1941 MRN: [id number]   Date of Service  08/25/2022  HPI/Events of Note  Patient with a low MAP predominantly because of a low diastolic blood pressure, patient is however asymptomatic.  eICU Interventions  Monitor closely, will order peripheral Phenylephrine gtt onto the [facility] so it is available for a MAP < 60 mmHg.        Kerry Kass Lido Maske 08/25/2022, 2:18 AM

## 2022-08-25 NOTE — Discharge Instructions (Signed)

## 2022-08-25 NOTE — Evaluation (Signed)
Clinical/Bedside Swallow Evaluation Patient Details  Name: Beth Garrison MRN: [id number] Date of Birth: 1941-01-01  Today's Date: 08/25/2022 Time: SLP Start Time (ACUTE ONLY): 4431 SLP Stop Time (ACUTE ONLY): 1705 SLP Time Calculation (min) (ACUTE ONLY): 15 min  Past Medical History:  Past Medical History:  Diagnosis Date   AKI (acute kidney injury) ([place]) 07/07/2019   Diabetes mellitus without complication ([place])    GERD (gastroesophageal reflux disease)    Hyperlipidemia    Hypertension    Past Surgical History:  Past Surgical History:  Procedure Laterality Date   BIOPSY  07/10/2019   Procedure: BIOPSY;  Surgeon: Carol Ada, MD;  Location: [facility];  Service: Endoscopy;;   ESOPHAGOGASTRODUODENOSCOPY (EGD) WITH PROPOFOL Left 07/10/2019   Procedure: ESOPHAGOGASTRODUODENOSCOPY (EGD) WITH PROPOFOL;  Surgeon: Carol Ada, MD;  Location: [facility];  Service: Endoscopy;  Laterality: Left;   TONSILLECTOMY AND ADENOIDECTOMY     HPI:  Per MDn notes "Pt has a 3 day history of increasing shortness of breath and SOB, vomiting last night.  Confusion as well over the last few days. Her daughter reports the patient smokes and has for many years, but quit approximately 10 years ago (pt reports 20 years ago).  She has had prior admissions for similar. An episode of vomiting evening prior to admit.  EGD 07/2019 showing Gastro-esophageal reflux disease with esophagitis. Swallow eval ordered due to pt coughing some with ice chips per RN.    Assessment / Plan / Recommendation  Clinical Impression  Patient presents with functional swallow- no indication of aspiration or dysphagia with all po observed including sandwich, potato, soda, pill with soda.  Swallow clinically judged to be timely with clear voice t/o. Pt admits to reflux and states she takes reflux medications since her endoscopy in 2020 that showed esophagitis.  No increased work of breathing or change in vitals observed during po. Recommend  continu diet as tolerated, no SLP follow up  needed.   Suspect altered mentation resolution rectified pt's dysphagia. SLP Visit Diagnosis: Dysphagia, unspecified (R13.10)    Aspiration Risk  No limitations    Diet Recommendation Regular;Thin liquid   Liquid Administration via: Cup;Straw Medication Administration: Whole meds with liquid Supervision: Patient able to self feed Compensations: Slow rate;Small sips/bites Postural Changes: Seated upright at 90 degrees;Remain upright for at least 30 minutes after po intake    Other  Recommendations Oral Care Recommendations: Oral care BID    Recommendations for follow up therapy are one component of a multi-disciplinary discharge planning process, led by the attending physician.  Recommendations may be updated based on patient status, additional functional criteria and insurance authorization.  Follow up Recommendations No SLP follow up      Assistance Recommended at Discharge None  Functional Status Assessment Patient has not had a recent decline in their functional status  Frequency and Duration      N/a      Prognosis   N/a     Swallow Study   General Date of Onset: 08/25/22 HPI: Per MDn notes "Pt has a 3 day history of increasing shortness of breath and SOB, vomiting last night.  Confusion as well over the last few days. Her daughter reports the patient smokes and has for many years, but quit approximately 10 years ago (pt reports 20 years ago).  She has had prior admissions for similar. An episode of vomiting evening prior to admit.  EGD 07/2019 showing Gastro-esophageal reflux disease with esophagitis. Swallow eval ordered due to pt coughing some with  ice chips per RN. Type of Study: Bedside Swallow Evaluation Diet Prior to this Study: Regular;Thin liquids Temperature Spikes Noted: No Respiratory Status: Room air History of Recent Intubation: No Behavior/Cognition: Alert;Cooperative;Pleasant mood Oral Cavity Assessment: Within  Functional Limits Oral Care Completed by SLP: No Oral Cavity - Dentition: Edentulous;Dentures, not available Vision: Functional for self-feeding Self-Feeding Abilities: Able to feed self Patient Positioning: Upright in bed Baseline Vocal Quality: Normal Volitional Cough: Other (Comment) (DNT) Volitional Swallow: Able to elicit    Oral/Motor/Sensory Function Overall Oral Motor/Sensory Function: Within functional limits   Ice Chips Ice chips: Not tested   Thin Liquid Thin Liquid: Within functional limits Presentation: Self Fed;Straw    Nectar Thick Nectar Thick Liquid: Not tested   Honey Thick Honey Thick Liquid: Not tested   Puree Puree: Not tested   Solid     Solid: Within functional limits Presentation: Self Fed Other Comments: baked potato and chicken salad sandwich      Macario Golds 08/25/2022,5:40 PM  Kathleen Lime, MS [place] Office [phone] Pager [phone]

## 2022-08-25 NOTE — Progress Notes (Signed)
PHARMACY NOTE:  ANTIMICROBIAL RENAL DOSAGE ADJUSTMENT  Current antimicrobial regimen includes a mismatch between antimicrobial dosage and estimated renal function.  As per policy approved by the Pharmacy & Therapeutics and Medical Executive Committees, the antimicrobial dosage will be adjusted accordingly.  Current antimicrobial dosage:   Zosyn 3.375 g IV infused over 4 hours every 12 hours  Indication: sepsis  Renal Function:  Estimated Creatinine Clearance: 20 mL/min (A) (by C-G formula based on SCr of 2.29 mg/dL (H)). '[]'$      On intermittent HD, scheduled: '[]'$      On CRRT    Antimicrobial dosage has been changed to:   Zosyn 3.375 g IV infused over 4 hours every 8 hours  Additional comments:   Thank you for allowing pharmacy to be a part of this patient's care.  Royetta Asal, PharmD, BCPS Clinical Pharmacist [place] Please utilize Amion for appropriate phone number to reach the unit pharmacist ([place]) 08/25/2022 8:54 AM

## 2022-08-25 NOTE — Inpatient Diabetes Management (Signed)
Inpatient Diabetes Program Recommendations  AACE/ADA: New Consensus Statement on Inpatient Glycemic Control (2015)  Target Ranges:  Prepandial:   less than 140 mg/dL      Peak postprandial:   less than 180 mg/dL (1-2 hours)      Critically ill patients:  140 - 180 mg/dL   Lab Results  Component Value Date   GLUCAP 158 (H) 08/25/2022   HGBA1C 6.8 (H) 08/23/2022    Review of Glycemic Control  Diabetes history: DM2 Outpatient Diabetes medications: Farxiga 10 mg QD, Tresiba 10 QD, glipizide 2.5 mg QD (not taking) Current orders for Inpatient glycemic control: IV insulin per EndoTool for DKA  CO2 - 20 Beta-hydroxy - 0.67 HgbA1C - 6.8%   Inpatient Diabetes Program Recommendations:    Consider when transitioning to SQ insulin:  Semglee 8 units Q24H Novolog 0-9 units TID with meals and 0-5 HS If post-prandials > 180, add Novolog 2-3 units TID with meals.  Would not restart Farxiga at discharge with recurrent UTIs and severe anion gap acidosis.  Continue to follow.  Thank you. Lorenda Peck, RD, LDN, [place] Inpatient Diabetes Coordinator [phone]

## 2022-08-25 NOTE — Progress Notes (Signed)
Pt is resting well at this time on RA. No resp distress noted. No need of bipap at this time.

## 2022-08-25 NOTE — Progress Notes (Addendum)
[facility] Progress Note Patient Name: Beth Garrison DOB: 02-08-1941 MRN: [id number]   Date of Service  08/25/2022  HPI/Events of Note  K+ 3.3, patient is on an Insulin gtt.  eICU Interventions  KCL 10 meq iv Q 1 hour x 4 doses order.        Kerry Kass Alin Hutchins 08/25/2022, 12:17 AM

## 2022-08-25 NOTE — Progress Notes (Signed)
[facility] Progress Note Patient Name: Beth Garrison DOB: 1941/10/12 MRN: [id number]   Date of Service  08/25/2022  HPI/Events of Note  Notified of hypomagnesemia with Mg at 1.3.  eICU Interventions  2g IV MgSO4 ordered.     Intervention Category Minor Interventions: Electrolytes abnormality - evaluation and management  Elsie Lincoln 08/25/2022, 10:10 PM

## 2022-08-25 NOTE — Progress Notes (Addendum)
Initial Nutrition Assessment  DOCUMENTATION CODES:   Non-severe (moderate) malnutrition in context of chronic illness  INTERVENTION:   Once diet advanced: -Glucerna Shake po TID, each supplement provides 220 kcal and 10 grams of protein  -Multivitamin with minerals daily  -Placed "Carbohydrate Counting" handout in AVS  NUTRITION DIAGNOSIS:   Moderate Malnutrition related to chronic illness (diabetes) as evidenced by mild fat depletion, mild muscle depletion.  GOAL:   Patient will meet greater than or equal to 90% of their needs  MONITOR:   Diet advancement, Labs, Weight trends, I & O's  REASON FOR ASSESSMENT:   Consult Diet education  ASSESSMENT:   81 y/o F who presented [facility] on [facility]20 with reports of weakness and shortness of breath.     The patient called EMS for a 3 day history of increasing shortness of breath and SOB, vomiting last night.  Patient in room with daughter at bedside. Pt currently NPO, diet to advance today per CCM note.  Pt states she didn't eat anything for 3 days PTA. When appetite is good, she tends to consumes 2 meals a day. A breakfast consisting of eggs, bacon, and toast. With dinner she consumes a meat with 2-3 sides. Pt takes a daily MVI and recently started an eye specific MVI.   Reports UBW of 160-170 lbs. Current weight: 161 lbs  Medications: KCl  Labs reviewed: CBGs: 168-182  Elevated Na  NUTRITION - FOCUSED PHYSICAL EXAM:  Flowsheet Row Most Recent Value  Orbital Region Mild depletion  Upper Arm Region Mild depletion  Thoracic and Lumbar Region No depletion  Buccal Region Moderate depletion  Temple Region Mild depletion  Clavicle Bone Region Mild depletion  Clavicle and Acromion Bone Region Mild depletion  Scapular Bone Region Mild depletion  Dorsal Hand Mild depletion  Patellar Region No depletion  Anterior Thigh Region No depletion  Posterior Calf Region No depletion  Edema (RD Assessment) None  Hair Reviewed  Eyes  Reviewed  Mouth Reviewed  [no teeth]  Skin Reviewed  Nails Reviewed       Diet Order:   Diet Order             Diet NPO time specified  Diet effective now                   EDUCATION NEEDS:   Education needs have been addressed  Skin:  Skin Assessment: Reviewed RN Assessment  Last BM:  9/21 -type 6  Height:   Ht Readings from Last 1 Encounters:  08/23/22 '5\' 5"'$  (1.651 m)    Weight:   Wt Readings from Last 1 Encounters:  08/23/22 73.3 kg   BMI:  Body mass index is 26.89 kg/m.  Estimated Nutritional Needs:   Kcal:  1800-2000  Protein:  80-90g  Fluid:  2L/day  Clayton Bibles, MS, RD, LDN Inpatient Clinical Dietitian Contact information available via Amion

## 2022-08-26 DIAGNOSIS — R6520 Severe sepsis without septic shock: Secondary | ICD-10-CM

## 2022-08-26 DIAGNOSIS — D696 Thrombocytopenia, unspecified: Secondary | ICD-10-CM

## 2022-08-26 DIAGNOSIS — E870 Hyperosmolality and hypernatremia: Secondary | ICD-10-CM

## 2022-08-26 DIAGNOSIS — A419 Sepsis, unspecified organism: Secondary | ICD-10-CM | POA: Diagnosis not present

## 2022-08-26 LAB — BASIC METABOLIC PANEL
Anion gap: 6 (ref 5–15)
BUN: 44 mg/dL — ABNORMAL HIGH (ref 8–23)
CO2: 24 mmol/L (ref 22–32)
Calcium: 7.6 mg/dL — ABNORMAL LOW (ref 8.9–10.3)
Chloride: 116 mmol/L — ABNORMAL HIGH (ref 98–111)
Creatinine, Ser: 1.94 mg/dL — ABNORMAL HIGH (ref 0.44–1.00)
GFR, Estimated: 26 mL/min — ABNORMAL LOW (ref >60)
Glucose, Bld: 205 mg/dL — ABNORMAL HIGH (ref 70–99)
Potassium: 3.9 mmol/L (ref 3.5–5.1)
Sodium: 146 mmol/L — ABNORMAL HIGH (ref 135–145)

## 2022-08-26 LAB — CBC
HCT: 22.2 % — ABNORMAL LOW (ref 36.0–46.0)
Hemoglobin: 7.1 g/dL — ABNORMAL LOW (ref 12.0–15.0)
MCH: 28.7 pg (ref 26.0–34.0)
MCHC: 32 g/dL (ref 30.0–36.0)
MCV: 89.9 fL (ref 80.0–100.0)
Platelets: 116 10*3/uL — ABNORMAL LOW (ref 150–400)
RBC: 2.47 MIL/uL — ABNORMAL LOW (ref 3.87–5.11)
RDW: 15.5 % (ref 11.5–15.5)
WBC: 11.5 10*3/uL — ABNORMAL HIGH (ref 4.0–10.5)
nRBC: 0.5 % — ABNORMAL HIGH (ref 0.0–0.2)

## 2022-08-26 LAB — GLUCOSE, CAPILLARY
Glucose-Capillary: 169 mg/dL — ABNORMAL HIGH (ref 70–99)
Glucose-Capillary: 151 mg/dL — ABNORMAL HIGH (ref 70–99)
Glucose-Capillary: 110 mg/dL — ABNORMAL HIGH (ref 70–99)
Glucose-Capillary: 115 mg/dL — ABNORMAL HIGH (ref 70–99)

## 2022-08-26 LAB — MAGNESIUM: Magnesium: 1.9 mg/dL (ref 1.7–2.4)

## 2022-08-26 MED ORDER — ENOXAPARIN SODIUM 30 MG/0.3ML IJ SOSY
30.0000 mg | PREFILLED_SYRINGE | INTRAMUSCULAR | Status: DC
  Administered 2022-08-26 – 2022-08-29 (×4): 30 mg via SUBCUTANEOUS
  Filled 2022-08-26 (×4): qty 0.3

## 2022-08-26 MED ORDER — SODIUM CHLORIDE 0.9 % IV SOLN
1.0000 g | INTRAVENOUS | Status: AC
  Administered 2022-08-26 – 2022-08-29 (×4): 1 g via INTRAVENOUS
  Filled 2022-08-26 (×4): qty 10

## 2022-08-26 MED ORDER — MIDODRINE HCL 5 MG PO TABS
5.0000 mg | ORAL_TABLET | Freq: Three times a day (TID) | ORAL | Status: DC
  Administered 2022-08-26 (×3): 5 mg via ORAL
  Filled 2022-08-26 (×3): qty 1

## 2022-08-26 MED ORDER — OXYCODONE HCL 5 MG PO TABS
5.0000 mg | ORAL_TABLET | Freq: Once | ORAL | Status: AC
  Administered 2022-08-27: 5 mg via ORAL
  Filled 2022-08-26: qty 1

## 2022-08-26 MED ORDER — ORAL CARE MOUTH RINSE: 15.0000 mL | OROMUCOSAL | Status: DC | PRN

## 2022-08-26 MED ORDER — PANTOPRAZOLE SODIUM 40 MG PO TBEC
40.0000 mg | DELAYED_RELEASE_TABLET | Freq: Every day | ORAL | Status: DC
  Administered 2022-08-26 – 2022-08-29 (×4): 40 mg via ORAL
  Filled 2022-08-26 (×4): qty 1

## 2022-08-26 NOTE — Progress Notes (Signed)
[facility] Progress Note Patient Name: [name] DOB: 31-Aug-1941 MRN: [id number]   Date of Service  08/26/2022  HPI/Events of Note  Notified of hypotension with SBP 90s-100s, with MAP in the 50s-low 60s when patient is asleep.  BP gets better with stimulation.  eICU Interventions  Start on midodrine.      Intervention Category Intermediate Interventions: Hypotension - evaluation and management  Elsie Lincoln 08/26/2022, 1:02 AM

## 2022-08-26 NOTE — Assessment & Plan Note (Signed)
Due to euglycemic DKA.  pH less than 7.1 on admission, bicarb 5.  [name] stopped, treated with insulin and fluids.  Now resolved. -Stop [name]

## 2022-08-26 NOTE — Assessment & Plan Note (Signed)
Slgihtly better today

## 2022-08-26 NOTE — Assessment & Plan Note (Signed)
-   Hold pravastatin

## 2022-08-26 NOTE — Progress Notes (Signed)
OT Cancellation Note  Patient Details Name: Beth Garrison MRN: [id number] DOB: November 05, 1941   Cancelled Treatment:    Reason Eval/Treat Not Completed: Patient at procedure or test/ unavailable. Patient being transferred to another floor.  Gwynne Kemnitz L Shalla Bulluck 08/26/2022, 3:50 PM

## 2022-08-26 NOTE — Progress Notes (Signed)
Primary nurse reached out to [facility], RN elink due to pt's blood pressure. Pressures are on the softer side. No new orders at this time. Will continue to monitor.

## 2022-08-26 NOTE — Progress Notes (Signed)
       REMOTE CROSS COVER NOTE  NAME: Beth Garrison MRN: [id number] DOB : 08-Nov-1941    Date of Service   08/26/2022   HPI/Events of Note   Medication request received for 10/10 chronic diabetic foot pain not relieved by tylenol. M(r)s Beth Garrison has a documented intolerance to gabapentin.  Interventions   Assessment/Plan:  Oxycodone x1     This document was prepared using Dragon voice recognition software and may include unintentional dictation errors.  Neomia Glass DNP, MHA, FNP-BC Nurse Practitioner [facility] Pager [phone]

## 2022-08-26 NOTE — Hospital Course (Signed)
Mrs. Eaker is a 81 y.o. F with DM, HTN, CKD IIIb baseline 1.3-1.6, and anemia who presented with fatigue, malaise, and shortness of breath.  Found to have euglycemic DKA, probably sepsis.

## 2022-08-26 NOTE — Assessment & Plan Note (Signed)
Plts unchanged

## 2022-08-26 NOTE — Assessment & Plan Note (Signed)
-   Glucerna - Dietitain consult

## 2022-08-26 NOTE — Progress Notes (Signed)
PHARMACIST - PHYSICIAN COMMUNICATION  DR:   Loleta Books  CONCERNING: IV to Oral Route Change Policy  RECOMMENDATION: This patient is receiving pantoprazole by the intravenous route.  Based on criteria approved by the Pharmacy and Therapeutics Committee, the intravenous medication(s) is/are being converted to the equivalent oral dose form(s).   DESCRIPTION: These criteria include: The patient is eating (either orally or via tube) and/or has been taking other orally administered medications for a least 24 hours The patient has no evidence of active gastrointestinal bleeding or impaired GI absorption (gastrectomy, short bowel, patient on TNA or NPO).  If you have questions about this conversion, please contact the Pharmacy Department  '[]'$   [phone] )  Beth Garrison '[]'$   [phone] )  [facility] '[]'$   [phone] )  [facility] '[]'$   [phone] )  [facility] '[x]'$   [phone] )  [place], [facility] 08/26/2022 10:50 AM

## 2022-08-26 NOTE — Assessment & Plan Note (Signed)
Suspected UTI.  Urine culture insignificant growth.  Unfortunately no blood cultures obtained.  No prior MDRO.  Prior urine cultures with Klebsiella, mostly susceptible. - Continue antibiotics, day 4, narrowed to ceftriaxone

## 2022-08-26 NOTE — Assessment & Plan Note (Signed)
Glucose controlled - Hold home Farxiga - Continue Semglee, 8 units daily - Continue sliding scale corrections

## 2022-08-26 NOTE — Assessment & Plan Note (Signed)
BP normal - Hold lisinopril - Stop midodrine

## 2022-08-26 NOTE — Assessment & Plan Note (Signed)
Cr 2.7 on admission, resolved to baseline 1.6

## 2022-08-26 NOTE — Assessment & Plan Note (Signed)
Resolved

## 2022-08-26 NOTE — Assessment & Plan Note (Signed)
Baseline 1.3-1.6.

## 2022-08-26 NOTE — Assessment & Plan Note (Signed)
Sodium slightly elevated - Continue IV fluids

## 2022-08-26 NOTE — Progress Notes (Signed)
  Progress Note   Patient: Beth Garrison UKR:[id number] DOB: 30-Oct-1941 DOA: 08/23/2022     2 DOS: the patient was seen and examined on 08/26/2022 at 8:05 AM      Brief hospital course: [name] is a 81 y.o. F with DM, HTN, CKD IIIb baseline 1.3-1.6, and anemia who presented with fatigue, malaise, and shortness of breath.  Found to have euglycemic DKA, probably sepsis.     Assessment and Plan: * Severe sepsis ([place]) Suspected UTI.  Urine culture insignificant growth.  Unfortunately no blood cultures obtained.  No prior MDRO.  Prior urine cultures with Klebsiella, mostly susceptible. - Continue antibiotics, day 4, narrowed to ceftriaxone  Thrombocytopenia (HCC) Platelets slightly down - Monitor for bleeding  Hypernatremia Sodium slightly elevated - Continue IV fluids  Malnutrition of moderate degree - Ensure twice daily  Hypothermia Resolved  Acute metabolic acidosis Due to euglycemic DKA.  pH less than 7.1 on admission, bicarb 5.  [name] stopped, treated with insulin and fluids.  Now resolved. -Stop Farxiga  Acute respiratory failure with hypoxia (HCC) Resolved.  Stage 3b chronic kidney disease (CKD) (HCC) Baseline 1.3-1.6.  Normocytic anemia Hemoglobin stable, close to needing transfusion.  AKI (acute kidney injury) ([place]) Cr 2.7 on admission, improved to 1.94 last 2 days, baseline 1.3-1.6 - Continue IV fluids  HTN (hypertension) BP low - Hold lisinopril - Continue new midodrine  Hyperlipidemia with target LDL less than 100 - Hold pravastatin  Type II diabetes mellitus with manifestations ([place]) - Hold home Farxiga - Continue Semglee, 8 units daily - Continue sliding scale corrections          Subjective: Patient feels tired, no fever, no confusion, no respiratory distress, no abdominal pain.  Just generally weak.     Physical Exam: BP (!) 128/40   Pulse 85   Temp 98 F (36.7 C) (Axillary)   Resp 18   Ht '5\' 5"'$  (1.651 m)   Wt 73.3 kg    SpO2 98%   BMI 26.89 kg/m   Elderly adult female, lying in bed, no acute distress RRR, no murmurs, mild nonpitting peripheral edema Respiratory rate normal, lungs clear without rales or wheezes Abdomen soft without tenderness palpation or guarding, no ascites or distention Attention normal, affect appropriate, oriented to person, place, time, situation Moves all extremities with generalized weakness but symmetric strength   Data Reviewed: Glucoses in the 200 range Basic metabolic panel shows mild hyponatremia, creatinine still 1.94, elevated, no change from yesterday Bicarb normalized Hemogram shows anemia, mild thrombocytopenia Urine culture with insignificant growth Respiratory virus panel negative       Disposition: Status is: Inpatient         Author: Edwin Dada, MD 08/26/2022 10:18 AM  For on call review [email or web address].

## 2022-08-27 DIAGNOSIS — A419 Sepsis, unspecified organism: Secondary | ICD-10-CM | POA: Diagnosis not present

## 2022-08-27 DIAGNOSIS — R6520 Severe sepsis without septic shock: Secondary | ICD-10-CM | POA: Diagnosis not present

## 2022-08-27 DIAGNOSIS — L89301 Pressure ulcer of unspecified buttock, stage 1: Secondary | ICD-10-CM

## 2022-08-27 LAB — COMPREHENSIVE METABOLIC PANEL
ALT: 27 U/L (ref 0–44)
AST: 35 U/L (ref 15–41)
Albumin: 2.3 g/dL — ABNORMAL LOW (ref 3.5–5.0)
Alkaline Phosphatase: 83 U/L (ref 38–126)
Anion gap: 6 (ref 5–15)
BUN: 35 mg/dL — ABNORMAL HIGH (ref 8–23)
CO2: 24 mmol/L (ref 22–32)
Calcium: 7.6 mg/dL — ABNORMAL LOW (ref 8.9–10.3)
Chloride: 110 mmol/L (ref 98–111)
Creatinine, Ser: 1.64 mg/dL — ABNORMAL HIGH (ref 0.44–1.00)
GFR, Estimated: 32 mL/min — ABNORMAL LOW (ref >60)
Glucose, Bld: 197 mg/dL — ABNORMAL HIGH (ref 70–99)
Potassium: 4.3 mmol/L (ref 3.5–5.1)
Sodium: 140 mmol/L (ref 135–145)
Total Bilirubin: 0.4 mg/dL (ref 0.3–1.2)
Total Protein: 4.8 g/dL — ABNORMAL LOW (ref 6.5–8.1)

## 2022-08-27 LAB — CBC
HCT: 24.5 % — ABNORMAL LOW (ref 36.0–46.0)
Hemoglobin: 7.6 g/dL — ABNORMAL LOW (ref 12.0–15.0)
MCH: 28.7 pg (ref 26.0–34.0)
MCHC: 31 g/dL (ref 30.0–36.0)
MCV: 92.5 fL (ref 80.0–100.0)
Platelets: 118 10*3/uL — ABNORMAL LOW (ref 150–400)
RBC: 2.65 MIL/uL — ABNORMAL LOW (ref 3.87–5.11)
RDW: 15.1 % (ref 11.5–15.5)
WBC: 10 10*3/uL (ref 4.0–10.5)
nRBC: 0.6 % — ABNORMAL HIGH (ref 0.0–0.2)

## 2022-08-27 LAB — GLUCOSE, CAPILLARY
Glucose-Capillary: 174 mg/dL — ABNORMAL HIGH (ref 70–99)
Glucose-Capillary: 216 mg/dL — ABNORMAL HIGH (ref 70–99)
Glucose-Capillary: 175 mg/dL — ABNORMAL HIGH (ref 70–99)
Glucose-Capillary: 216 mg/dL — ABNORMAL HIGH (ref 70–99)

## 2022-08-27 MED ORDER — PREGABALIN 25 MG PO CAPS
25.0000 mg | ORAL_CAPSULE | Freq: Once | ORAL | Status: AC
  Administered 2022-08-27: 25 mg via ORAL
  Filled 2022-08-27: qty 1

## 2022-08-27 MED ORDER — GABAPENTIN 300 MG PO CAPS: 300.0000 mg | ORAL_CAPSULE | Freq: Once | ORAL | Status: DC

## 2022-08-27 MED ORDER — IPRATROPIUM-ALBUTEROL 0.5-2.5 (3) MG/3ML IN SOLN
3.0000 mL | Freq: Three times a day (TID) | RESPIRATORY_TRACT | Status: DC
  Administered 2022-08-27: 3 mL via RESPIRATORY_TRACT
  Filled 2022-08-27: qty 3

## 2022-08-27 MED ORDER — IPRATROPIUM-ALBUTEROL 0.5-2.5 (3) MG/3ML IN SOLN
3.0000 mL | Freq: Two times a day (BID) | RESPIRATORY_TRACT | Status: DC
  Administered 2022-08-27 – 2022-08-30 (×6): 3 mL via RESPIRATORY_TRACT
  Filled 2022-08-27 (×6): qty 3

## 2022-08-27 NOTE — Assessment & Plan Note (Signed)
Present on admission

## 2022-08-27 NOTE — Plan of Care (Signed)
Problem: Education: Goal: Knowledge of General Education information will improve Description: Including pain rating scale, medication(s)/side effects and non-pharmacologic comfort measures Outcome: Progressing   Problem: Health Behavior/Discharge Planning: Goal: Ability to manage health-related needs will improve Outcome: Progressing   Problem: Clinical Measurements: Goal: Ability to maintain clinical measurements within normal limits will improve Outcome: Progressing Goal: Will remain free from infection Outcome: Progressing Goal: Diagnostic test results will improve Outcome: Progressing Goal: Respiratory complications will improve Outcome: Progressing Goal: Cardiovascular complication will be avoided Outcome: Progressing   Problem: Activity: Goal: Risk for activity intolerance will decrease Outcome: Progressing   Problem: Nutrition: Goal: Adequate nutrition will be maintained Outcome: Progressing   Problem: Coping: Goal: Level of anxiety will decrease Outcome: Progressing   Problem: Elimination: Goal: Will not experience complications related to bowel motility Outcome: Progressing Goal: Will not experience complications related to urinary retention Outcome: Progressing   Problem: Pain Managment: Goal: General experience of comfort will improve Outcome: Progressing   Problem: Safety: Goal: Ability to remain free from injury will improve Outcome: Progressing   Problem: Skin Integrity: Goal: Risk for impaired skin integrity will decrease Outcome: Progressing   Problem: Education: Goal: Knowledge of disease or condition will improve Outcome: Progressing Goal: Knowledge of the prescribed therapeutic regimen will improve Outcome: Progressing Goal: Individualized Educational Video(s) Outcome: Progressing   Problem: Activity: Goal: Ability to tolerate increased activity will improve Outcome: Progressing Goal: Will verbalize the importance of balancing  activity with adequate rest periods Outcome: Progressing   Problem: Respiratory: Goal: Ability to maintain a clear airway will improve Outcome: Progressing Goal: Levels of oxygenation will improve Outcome: Progressing Goal: Ability to maintain adequate ventilation will improve Outcome: Progressing   Problem: Activity: Goal: Ability to tolerate increased activity will improve Outcome: Progressing   Problem: Clinical Measurements: Goal: Ability to maintain a body temperature in the normal range will improve Outcome: Progressing   Problem: Respiratory: Goal: Ability to maintain adequate ventilation will improve Outcome: Progressing Goal: Ability to maintain a clear airway will improve Outcome: Progressing   Problem: Fluid Volume: Goal: Hemodynamic stability will improve Outcome: Progressing   Problem: Clinical Measurements: Goal: Diagnostic test results will improve Outcome: Progressing Goal: Signs and symptoms of infection will decrease Outcome: Progressing   Problem: Respiratory: Goal: Ability to maintain adequate ventilation will improve Outcome: Progressing   Problem: Education: Goal: Ability to describe self-care measures that may prevent or decrease complications (Diabetes Survival Skills Education) will improve Outcome: Progressing Goal: Individualized Educational Video(s) Outcome: Progressing   Problem: Coping: Goal: Ability to adjust to condition or change in health will improve Outcome: Progressing   Problem: Fluid Volume: Goal: Ability to maintain a balanced intake and output will improve Outcome: Progressing   Problem: Health Behavior/Discharge Planning: Goal: Ability to identify and utilize available resources and services will improve Outcome: Progressing Goal: Ability to manage health-related needs will improve Outcome: Progressing   Problem: Metabolic: Goal: Ability to maintain appropriate glucose levels will improve Outcome: Progressing    Problem: Nutritional: Goal: Maintenance of adequate nutrition will improve Outcome: Progressing Goal: Progress toward achieving an optimal weight will improve Outcome: Progressing   Problem: Skin Integrity: Goal: Risk for impaired skin integrity will decrease Outcome: Progressing   Problem: Tissue Perfusion: Goal: Adequacy of tissue perfusion will improve Outcome: Progressing   Problem: Education: Goal: Ability to describe self-care measures that may prevent or decrease complications (Diabetes Survival Skills Education) will improve Outcome: Progressing Goal: Individualized Educational Video(s) Outcome: Progressing   Problem: Cardiac: Goal: Ability to maintain an  adequate cardiac output will improve Outcome: Progressing   Problem: Health Behavior/Discharge Planning: Goal: Ability to identify and utilize available resources and services will improve Outcome: Progressing Goal: Ability to manage health-related needs will improve Outcome: Progressing   Problem: Fluid Volume: Goal: Ability to achieve a balanced intake and output will improve Outcome: Progressing   Problem: Metabolic: Goal: Ability to maintain appropriate glucose levels will improve Outcome: Progressing   Problem: Nutritional: Goal: Maintenance of adequate nutrition will improve Outcome: Progressing Goal: Maintenance of adequate weight for body size and type will improve Outcome: Progressing   Problem: Respiratory: Goal: Will regain and/or maintain adequate ventilation Outcome: Progressing   Problem: Urinary Elimination: Goal: Ability to achieve and maintain adequate renal perfusion and functioning will improve Outcome: Progressing   Problem: Safety: Goal: Non-violent Restraint(s) Outcome: Progressing

## 2022-08-27 NOTE — Evaluation (Signed)
Physical Therapy Evaluation Patient Details Name: Beth Garrison MRN: [id number] DOB: [birth year] Today's Date: 08/27/2022  History of Present Illness  81 yo female admitted with UTI, severe sepsis. Hx of DM, CKD, anemia  Clinical Impression  On eval, pt required Mod A +2 for mobility. She stood at bedside with use of RW x 2, however she was unable to take any steps. She required +2 side by side assist to transfer over to recliner. Pt presents with general weakness, decreased activity tolerance, and impaired gait and balance. Family was present during session. Discussed d/c plan-PT recommending ST SNF.        Recommendations for follow up therapy are one component of a multi-disciplinary discharge planning process, led by the attending physician.  Recommendations may be updated based on patient status, additional functional criteria and insurance authorization.  Follow Up Recommendations Skilled nursing-short term rehab (<3 hours/day) Can patient physically be transported by private vehicle: No    Assistance Recommended at Discharge Frequent or constant Supervision/Assistance  Patient can return home with the following  Two people to help with walking and/or transfers;Two people to help with bathing/dressing/bathroom;Help with stairs or ramp for entrance;Assistance with cooking/housework;Assist for transportation    Equipment Recommendations None recommended by PT  Recommendations for Other Services       Functional Status Assessment Patient has had a recent decline in their functional status and demonstrates the ability to make significant improvements in function in a reasonable and predictable amount of time.     Precautions / Restrictions Precautions Precautions: Fall Restrictions Weight Bearing Restrictions: No      Mobility  Bed Mobility Overal bed mobility: Needs Assistance Bed Mobility: Supine to Sit     Supine to sit: Mod assist, +2 for physical assistance, +2 for  safety/equipment, HOB elevated     General bed mobility comments: Assist for trunk and bil LEs. Utilized bedpad to assist with scooting, positioning. Increased time.    Transfers Overall transfer level: Needs assistance Equipment used: Rolling walker (2 wheels) Transfers: Sit to/from Stand, Bed to chair/wheelchair/BSC Sit to Stand: Mod assist, +2 physical assistance, +2 safety/equipment, From elevated surface Stand pivot transfers: +2 physical assistance, +2 safety/equipment, Mod assist         General transfer comment: Sit to stand x 2 with RW at bedside. Pt stood for ~15-20 seconds before having to sit down. She was unable to take any steps with use of RW 2* weakness. 2 person assist to pivot over to recliner.    Ambulation/Gait                  Stairs            Wheelchair Mobility    Modified Rankin (Stroke Patients Only)       Balance Overall balance assessment: Needs assistance         Standing balance support: Bilateral upper extremity supported, Reliant on assistive device for balance, During functional activity Standing balance-Leahy Scale: Poor                               Pertinent Vitals/Pain Pain Assessment Pain Assessment: No/denies pain    Home Living Family/patient expects to be discharged to:: Unsure Living Arrangements: Children Available Help at Discharge: Family;Available PRN/intermittently Type of Home: House Home Access: Stairs to enter Entrance Stairs-Rails: Psychiatric nurse of Steps: 5   Home Layout: One level Home Equipment: Conservation officer, nature (2 wheels);Cane - single  point      Prior Function Prior Level of Function : Needs assist             Mobility Comments: per family, pt was ambulatory with ADs PRN       Hand Dominance        Extremity/Trunk Assessment   Upper Extremity Assessment Upper Extremity Assessment: Defer to OT evaluation    Lower Extremity Assessment Lower  Extremity Assessment: Generalized weakness    Cervical / Trunk Assessment Cervical / Trunk Assessment: Normal  Communication   Communication: No difficulties  Cognition Arousal/Alertness: Awake/alert Behavior During Therapy: WFL for tasks assessed/performed Overall Cognitive Status: Within Functional Limits for tasks assessed                                          General Comments      Exercises     Assessment/Plan    PT Assessment Patient needs continued PT services  PT Problem List Decreased strength;Decreased mobility;Decreased activity tolerance;Decreased balance;Decreased knowledge of use of DME       PT Treatment Interventions DME instruction;Gait training;Therapeutic activities;Therapeutic exercise;Patient/family education;Balance training;Functional mobility training    PT Goals (Current goals can be found in the Care Plan section)  Acute Rehab PT Goals Patient Stated Goal: to get stronger PT Goal Formulation: With patient/family Time For Goal Achievement: 09/10/22 Potential to Achieve Goals: Good    Frequency Min 2X/week     Co-evaluation               AM-PAC PT "6 Clicks" Mobility  Outcome Measure Help needed turning from your back to your side while in a flat bed without using bedrails?: A Lot Help needed moving from lying on your back to sitting on the side of a flat bed without using bedrails?: A Lot Help needed moving to and from a bed to a chair (including a wheelchair)?: A Lot Help needed standing up from a chair using your arms (e.g., wheelchair or bedside chair)?: A Lot Help needed to walk in hospital room?: Total Help needed climbing 3-5 steps with a railing? : Total 6 Click Score: 10    End of Session   Activity Tolerance: Patient limited by fatigue Patient left: in chair;with call bell/phone within reach;with family/visitor present;with chair alarm set   PT Visit Diagnosis: Muscle weakness (generalized)  (M62.81);Difficulty in walking, not elsewhere classified (R26.2)    Time: 1400-1420 PT Time Calculation (min) (ACUTE ONLY): 20 min   Charges:   PT Evaluation $PT Eval Moderate Complexity: 1 Mod           Doreatha Massed, PT Acute Rehabilitation  Office: [phone] Pager: [phone]

## 2022-08-27 NOTE — Evaluation (Signed)
Occupational Therapy Evaluation Patient Details Name: Beth Garrison MRN: [id number] DOB: May 17, 1941 Today's Date: 08/27/2022   History of Present Illness 81 yo female admitted with UTI, severe sepsis. Hx of DM, CKD, anemia   Clinical Impression   Beth Garrison is a 81 year old woman admitted to hospital with above medical history and presents with generalized weakness, decreased activity tolerance, impaired balance and pain. Patient needing mod x 2 to stand and max+2 assist to pivot back to bed as well as  increased assistance with ADLs including max-total assist for LB ADLs and toileting. Patient will benefit from skilled OT services while in hospital to improve deficits and learn compensatory strategies as needed in order to return to PLOF.  Recommend short term rehab at discharge prior to return home.        Recommendations for follow up therapy are one component of a multi-disciplinary discharge planning process, led by the attending physician.  Recommendations may be updated based on patient status, additional functional criteria and insurance authorization.   Follow Up Recommendations  Skilled nursing-short term rehab (<3 hours/day)    Assistance Recommended at Discharge Frequent or constant Supervision/Assistance  Patient can return home with the following A lot of help with bathing/dressing/bathroom;Two people to help with walking and/or transfers;Assistance with cooking/housework;Help with stairs or ramp for entrance    Functional Status Assessment  Patient has had a recent decline in their functional status and demonstrates the ability to make significant improvements in function in a reasonable and predictable amount of time.  Equipment Recommendations  Other (comment) Architect)    Recommendations for Other Services       Precautions / Restrictions Precautions Precautions: Fall Restrictions Weight Bearing Restrictions: No      Mobility Bed Mobility Overal bed  mobility: Needs Assistance Bed Mobility: Sit to Supine       Sit to supine: Max assist   General bed mobility comments: Max assist to return to supine with patient able to assist trunk.    Transfers Overall transfer level: Needs assistance Equipment used: Rolling walker (2 wheels) Transfers: Sit to/from Stand, Bed to chair/wheelchair/BSC Sit to Stand: Mod assist, +2 physical assistance, +2 safety/equipment   Squat pivot transfers: Max assist, +2 safety/equipment, +2 physical assistance       General transfer comment: Patient found in recliner. Stood x 2 with walker from recliner but unable to take steps. Unable to take steps with heavy assist side by side. Required max x 2 pivot to edge of bed.      Balance Overall balance assessment: Needs assistance Sitting-balance support: No upper extremity supported, Feet supported Sitting balance-Leahy Scale: Fair     Standing balance support: Bilateral upper extremity supported, Reliant on assistive device for balance, During functional activity Standing balance-Leahy Scale: Poor                             ADL either performed or assessed with clinical judgement   ADL Overall ADL's : Needs assistance/impaired Eating/Feeding: Set up;Sitting   Grooming: Set up;Sitting   Upper Body Bathing: Moderate assistance;Sitting;Set up   Lower Body Bathing: Maximal assistance;Sitting/lateral leans   Upper Body Dressing : Sitting;Minimal assistance   Lower Body Dressing: Total assistance;Sit to/from stand   Toilet Transfer: +2 for physical assistance;Maximal assistance;BSC/3in1;Squat-pivot   Toileting- Water quality scientist and Hygiene: Total assistance;+2 for physical assistance;Sit to/from stand       Functional mobility during ADLs: Total assistance;+2 for physical assistance;Rolling  walker (2 wheels)       Vision Patient Visual Report: No change from baseline       Perception     Praxis      Pertinent  Vitals/Pain Pain Assessment Pain Assessment: No/denies pain     Hand Dominance Right   Extremity/Trunk Assessment Upper Extremity Assessment Upper Extremity Assessment: Generalized weakness (grossly 3+/5 UB strength)   Lower Extremity Assessment Lower Extremity Assessment: Generalized weakness   Cervical / Trunk Assessment Cervical / Trunk Assessment: Kyphotic   Communication Communication Communication: No difficulties   Cognition Arousal/Alertness: Awake/alert Behavior During Therapy: WFL for tasks assessed/performed Overall Cognitive Status: Within Functional Limits for tasks assessed                                       General Comments       Exercises     Shoulder Instructions      Home Living Family/patient expects to be discharged to:: Skilled nursing facility Living Arrangements: Children Available Help at Discharge: Family;Available PRN/intermittently Type of Home: House Home Access: Stairs to enter [employer] of Steps: 5 Entrance Stairs-Rails: Right;Left Home Layout: One level               Home Equipment: Conservation officer, nature (2 wheels);Cane - single point          Prior Functioning/Environment Prior Level of Function : Needs assist             Mobility Comments: per family, pt was ambulatory with ADs PRN ADLs Comments: independent wtih ADLs - assistance for IADLs        OT Problem List: Decreased strength;Decreased activity tolerance;Impaired balance (sitting and/or standing);Decreased knowledge of use of DME or AE;Cardiopulmonary status limiting activity;Obesity      OT Treatment/Interventions: Self-care/ADL training;Therapeutic exercise;DME and/or AE instruction;Therapeutic activities;Balance training;Patient/family education    OT Goals(Current goals can be found in the care plan section) Acute Rehab OT Goals Patient Stated Goal: get stronger OT Goal Formulation: With patient Time For Goal Achievement:  09/11/22 Potential to Achieve Goals: Good  OT Frequency: Min 2X/week    Co-evaluation              AM-PAC OT "6 Clicks" Daily Activity     Outcome Measure Help from another person eating meals?: A Little Help from another person taking care of personal grooming?: A Little Help from another person toileting, which includes using toliet, bedpan, or urinal?: Total Help from another person bathing (including washing, rinsing, drying)?: A Lot Help from another person to put on and taking off regular upper body clothing?: A Lot Help from another person to put on and taking off regular lower body clothing?: Total 6 Click Score: 12   End of Session Equipment Utilized During Treatment: Rolling walker (2 wheels);Gait belt Nurse Communication: Mobility status  Activity Tolerance: Patient limited by fatigue Patient left: in bed;with call bell/phone within reach;with bed alarm set;with family/visitor present  OT Visit Diagnosis: Muscle weakness (generalized) (M62.81)                Time: 1534-1550 OT Time Calculation (min): 16 min Charges:  OT General Charges $OT Visit: 1 Visit OT Evaluation $OT Eval Low Complexity: 1 Low  Gustavo Lah, OTR/L [place]  Office [phone]   Lenward Chancellor 08/27/2022, 5:28 PM

## 2022-08-27 NOTE — Progress Notes (Signed)
  Progress Note   Patient: Beth Garrison AST:[id number] DOB: 1941-10-17 DOA: 08/23/2022     3 DOS: the patient was seen and examined on 08/27/2022 at 9:05AM      Brief hospital course: Beth Garrison is a 81 y.o. F with DM, HTN, CKD IIIb baseline 1.3-1.6, and anemia who presented with fatigue, malaise, and shortness of breath.  Found to have euglycemic DKA, probably sepsis.     Assessment and Plan: * Severe sepsis ([place]) Suspected UTI.  Urine culture insignificant growth.  Unfortunately no blood cultures obtained.  No prior MDRO.  Prior urine cultures with Klebsiella, mostly susceptible. - Continue Rocephin, day 5  Pressure injury of buttock, stage 1 Present on admission  Thrombocytopenia (HCC) Plts unchanged  Hypernatremia Resolved with fluids - Stop fluids  Malnutrition of moderate degree - Glucerna - Dietitain consult  Hypothermia Resolved  Acute metabolic acidosis Due to euglycemic DKA.  pH less than 7.1 on admission, bicarb 5.  [name] stopped, treated with insulin and fluids.  Now resolved. -Stop Farxiga  Acute respiratory failure with hypoxia (HCC) Resolved.  Stage 3b chronic kidney disease (CKD) (HCC) Baseline 1.3-1.6.  Normocytic anemia Slgihtly better today  AKI (acute kidney injury) ([place]) Cr 2.7 on admission, improved to 1.6 today with fluids - Stop IV fluids - Push oral fluids  HTN (hypertension) BP normal - Hold lisinopril - Stop midodrine   Hyperlipidemia with target LDL less than 100 - Hold pravastatin  Type II diabetes mellitus with manifestations ([place]) - Hold home Farxiga - Continue Semglee, 8 units daily - Continue sliding scale corrections          Subjective: Having a little bit of nausea, no confusion, no fever, no chest pain, no respiratory distress.     Physical Exam: BP (!) 129/52 (BP Location: Right Arm)   Pulse (!) 107   Temp 98.4 F (36.9 C) (Oral)   Resp 18   Ht '5\' 5"'$  (1.651 m)   Wt 84.3 kg   SpO2 91%   BMI  30.93 kg/m   Elderly adult female, lying in bed, slightly weak Tachycardic, regular, no murmurs, no peripheral edema Respiratory rate normal, lungs clear without rales or wheezes Abdomen soft without tenderness palpation or guarding, no ascites or distention Attention normal, affect appropriate, judgment insight appear normal    Data Reviewed: Basic metabolic panel notable for sodium down normal, glucose normal Creatinine 1.6 Hemoglobin slightly up to 7.6 Platelets 118 no change      Disposition: Status is: Inpatient Patient was admitted for euglycemic DKA and possible sepsis  She is stabilized and is now off of IV treatments, medically ready for discharge to skilled nursing when bed available        Author: Edwin Dada, MD 08/27/2022 2:50 PM  For on call review [email or web address].

## 2022-08-28 ENCOUNTER — Inpatient Hospital Stay (HOSPITAL_COMMUNITY): Payer: Medicare Other

## 2022-08-28 DIAGNOSIS — A419 Sepsis, unspecified organism: Secondary | ICD-10-CM | POA: Diagnosis not present

## 2022-08-28 DIAGNOSIS — R509 Fever, unspecified: Secondary | ICD-10-CM

## 2022-08-28 DIAGNOSIS — R6520 Severe sepsis without septic shock: Secondary | ICD-10-CM | POA: Diagnosis not present

## 2022-08-28 LAB — CBC
HCT: 23.3 % — ABNORMAL LOW (ref 36.0–46.0)
Hemoglobin: 7.1 g/dL — ABNORMAL LOW (ref 12.0–15.0)
MCH: 28.3 pg (ref 26.0–34.0)
MCHC: 30.5 g/dL (ref 30.0–36.0)
MCV: 92.8 fL (ref 80.0–100.0)
Platelets: 100 10*3/uL — ABNORMAL LOW (ref 150–400)
RBC: 2.51 MIL/uL — ABNORMAL LOW (ref 3.87–5.11)
RDW: 14.3 % (ref 11.5–15.5)
WBC: 10 10*3/uL (ref 4.0–10.5)
nRBC: 0.6 % — ABNORMAL HIGH (ref 0.0–0.2)

## 2022-08-28 LAB — GLUCOSE, CAPILLARY
Glucose-Capillary: 125 mg/dL — ABNORMAL HIGH (ref 70–99)
Glucose-Capillary: 221 mg/dL — ABNORMAL HIGH (ref 70–99)
Glucose-Capillary: 252 mg/dL — ABNORMAL HIGH (ref 70–99)
Glucose-Capillary: 125 mg/dL — ABNORMAL HIGH (ref 70–99)

## 2022-08-28 LAB — BASIC METABOLIC PANEL
Anion gap: 5 (ref 5–15)
BUN: 26 mg/dL — ABNORMAL HIGH (ref 8–23)
CO2: 24 mmol/L (ref 22–32)
Calcium: 7.6 mg/dL — ABNORMAL LOW (ref 8.9–10.3)
Chloride: 106 mmol/L (ref 98–111)
Creatinine, Ser: 1.61 mg/dL — ABNORMAL HIGH (ref 0.44–1.00)
GFR, Estimated: 32 mL/min — ABNORMAL LOW (ref >60)
Glucose, Bld: 152 mg/dL — ABNORMAL HIGH (ref 70–99)
Potassium: 4.7 mmol/L (ref 3.5–5.1)
Sodium: 135 mmol/L (ref 135–145)

## 2022-08-28 NOTE — NC FL2 (Signed)
[place] LEVEL OF CARE SCREENING TOOL     IDENTIFICATION  Patient Name: Beth Garrison Birthdate: 1941-01-14 Sex: female Admission Date (Current Location): 08/23/2022  [facility] [place] Number:  Herbalist and Address:  [facility],  [place] [place], [place]      Provider Number: [id number]  Attending Physician Name and Address:  Edwin Dada, *  Relative Name and Phone Number:   Sharyn Lull [place](dtr) [phone])    Current Level of Care: Hospital Recommended Level of Care: [place] Prior Approval Number:    Date Approved/Denied:   PASRR Number:  [id number])  Discharge Plan: SNF    Current Diagnoses: Patient Active Problem List   Diagnosis Date Noted   Fever 08/28/2022   Pressure injury of buttock, stage 1 08/27/2022   Hypernatremia 08/26/2022   Thrombocytopenia ([place]) 08/26/2022   Malnutrition of moderate degree 08/25/2022   Severe sepsis ([place]) 08/24/2022   Acute respiratory failure with hypoxia ([place]) 18/56/3149   Acute metabolic acidosis 70/26/3785   Hypothermia 08/23/2022   Hyponatremia 03/27/2021   Intractable nausea and vomiting 03/27/2021   Pressure injury of skin 03/27/2021   Sepsis ([place]) 03/26/2021   Acute lower UTI 03/26/2021   Stage 3b chronic kidney disease (CKD) ([place]) 03/26/2021   Renal failure 07/07/2019   AKI (acute kidney injury) ([place]) 07/07/2019   Hyperkalemia 07/07/2019   Normocytic anemia 07/07/2019   Melena 07/07/2019   Routine general medical examination at a health care facility 03/23/2014   Other screening mammogram 03/23/2014   Type II diabetes mellitus with manifestations ([place]) 09/08/2013   Hyperlipidemia with target LDL less than 100 09/08/2013   HTN (hypertension) 09/08/2013    Orientation RESPIRATION BLADDER Height & Weight     Self, Time, Situation, Place  Normal Continent Weight: 84.3 kg Height:  '5\' 5"'$  (165.1 cm)  BEHAVIORAL SYMPTOMS/MOOD  NEUROLOGICAL BOWEL NUTRITION STATUS      Continent Diet (CHO MOD)  AMBULATORY STATUS COMMUNICATION OF NEEDS Skin   Limited Assist Verbally Normal                       Personal Care Assistance Level of Assistance  Bathing, Feeding, Dressing Bathing Assistance: Limited assistance Feeding assistance: Limited assistance Dressing Assistance: Limited assistance     Functional Limitations Info  Sight, Hearing, Speech Sight Info: Adequate Hearing Info: Adequate Speech Info: Adequate    SPECIAL CARE FACTORS FREQUENCY  PT (By licensed PT), OT (By licensed OT)     PT Frequency:  (5x week) OT Frequency:  (5x week)            Contractures Contractures Info: Not present    Additional Factors Info  Code Status, Allergies Code Status Info:  (Full) Allergies Info:  (Dulaglutide, Gabapentin)           Current Medications (08/28/2022):  This is the current hospital active medication list Current Facility-Administered Medications  Medication Dose Route Frequency Provider Last Rate Last Admin   0.9 %  sodium chloride infusion  250 mL Intravenous Continuous Frederik Pear, MD   Stopped at 08/27/22 1536   acetaminophen (TYLENOL) tablet 650 mg  650 mg Oral Q6H PRN Reubin Milan, MD   650 mg at 08/27/22 0556   Or   acetaminophen (TYLENOL) suppository 650 mg  650 mg Rectal Q6H PRN Reubin Milan, MD       albuterol (PROVENTIL) (2.5 MG/3ML) 0.083% nebulizer solution 2.5 mg  2.5  mg Nebulization Q4H PRN Reubin Milan, MD       arformoterol [facility]) nebulizer solution 15 mcg  15 mcg Nebulization BID Maryjane Hurter, MD   15 mcg at 08/28/22 0814   budesonide (PULMICORT) nebulizer solution 0.5 mg  0.5 mg Nebulization BID Noe Gens L, NP   0.5 mg at 08/28/22 0814   cefTRIAXone (ROCEPHIN) 1 g in sodium chloride 0.9 % 100 mL IVPB  1 g Intravenous Q24H Danford, Suann Larry, MD 200 mL/hr at 08/28/22 1131 1 g at 08/28/22 1131   dextrose 50 % solution 0-50 mL  0-50 mL  Intravenous PRN Frederik Pear, MD       enoxaparin (LOVENOX) injection 30 mg  30 mg Subcutaneous Q24H Danford, Suann Larry, MD   30 mg at 08/28/22 1341   feeding supplement (GLUCERNA SHAKE) (GLUCERNA SHAKE) liquid 237 mL  237 mL Oral TID BM Maryjane Hurter, MD   237 mL at 08/28/22 1341   insulin aspart (novoLOG) injection 0-5 Units  0-5 Units Subcutaneous QHS Maryjane Hurter, MD   2 Units at 08/27/22 2158   insulin aspart (novoLOG) injection 0-9 Units  0-9 Units Subcutaneous TID WC Maryjane Hurter, MD   3 Units at 08/28/22 1141   insulin glargine-yfgn [facility]) injection 8 Units  8 Units Subcutaneous Daily Maryjane Hurter, MD   8 Units at 08/28/22 0910   ipratropium-albuterol (DUONEB) 0.5-2.5 (3) MG/3ML nebulizer solution 3 mL  3 mL Nebulization BID Edwin Dada, MD   3 mL at 08/28/22 0165   multivitamin with minerals tablet 1 tablet  1 tablet Oral Daily Maryjane Hurter, MD   1 tablet at 08/28/22 0909   ondansetron (ZOFRAN) tablet 4 mg  4 mg Oral Q6H PRN Reubin Milan, MD   4 mg at 08/26/22 1240   Or   ondansetron (ZOFRAN) injection 4 mg  4 mg Intravenous Q6H PRN Reubin Milan, MD   4 mg at 08/27/22 1443   Oral care mouth rinse  15 mL Mouth Rinse PRN Danford, Suann Larry, MD       pantoprazole (PROTONIX) EC tablet 40 mg  40 mg Oral QHS Suzzanne Cloud, RPH   40 mg at 08/27/22 2158     Discharge Medications: Please see discharge summary for a list of discharge medications.  Relevant Imaging Results:  Relevant Lab Results:   Additional Information SS#: [id number]  Dessa Phi, RN

## 2022-08-28 NOTE — Assessment & Plan Note (Signed)
CXR shows elevated hemidiaphragm and atelectasis.  Will obtain blood and urine cultures for completeness, but appears nontoxic. - IS - Mobilization

## 2022-08-28 NOTE — Care Management Important Message (Signed)
Important Message  Patient Details IM Letter given to the Patient. Name: Beth Garrison MRN: [id number] Date of Birth: Jan 24, 1941   Medicare Important Message Given:  Yes     Kerin Salen 08/28/2022, 11:24 AM

## 2022-08-28 NOTE — Progress Notes (Signed)
  Progress Note   Patient: Beth Garrison [id number]:[id number] DOB: 01/09/1941 DOA: 08/23/2022     4 DOS: the patient was seen and examined on 08/28/2022 at 10:45AM      Brief hospital course: Beth Garrison is a 81 y.o. F with DM, HTN, CKD IIIb baseline 1.3-1.6, and anemia who presented with fatigue, malaise, and shortness of breath.  Found to have euglycemic DKA, probably sepsis.      Assessment and Plan: * Severe sepsis (Beth Garrison) Suspected UTI.  Urine culture insignificant growth.  Unfortunately no blood cultures obtained.  No prior MDRO.  Prior urine cultures with Klebsiella, mostly susceptible. - Continue Rocephin, day 6  Fever CXR shows elevated hemidiaphragm and atelectasis.  Will obtain blood and urine cultures for completeness, but appears nontoxic. - IS - Mobilization  Pressure injury of buttock, stage 1 Present on admission  Thrombocytopenia (Beth Garrison) Plts unchanged  Hypernatremia Resolved with fluids    Malnutrition of moderate degree - Glucerna - Dietitain consult  Hypothermia Resolved  Acute metabolic acidosis Due to euglycemic DKA.  pH less than 7.1 on admission, bicarb 5.  Beth Garrison stopped, treated with insulin and fluids.  Now resolved. -Stop Beth Garrison  Acute respiratory failure with hypoxia (Beth Garrison) Resolved.  Stage 3b chronic kidney disease (CKD) (Beth Garrison) Baseline 1.3-1.6.  Normocytic anemia Slgihtly better today  AKI (acute kidney injury) (Beth Garrison) Cr 2.7 on admission, resolved to baseline 1.6  HTN (hypertension) BP normal - Hold lisinopril - Stop midodrine   Hyperlipidemia with target LDL less than 100 - Hold pravastatin  Type II diabetes mellitus with manifestations (Beth Garrison) - Hold home Beth Garrison - Continue Beth Garrison, 8 units daily - Continue sliding scale corrections          Subjective: Low grade fever overnight.  No cough, no urinary irritative symptoms.  No abdominal pain.  No new sores.  No confusion.     Physical Exam: BP (!) 109/48 (BP Location:  Right Arm)   Pulse 87   Temp 98.6 F (37 C) (Oral)   Resp 16   Ht '5\' 5"'$  (1.651 m)   Wt 84.3 kg   SpO2 98%   BMI 30.93 kg/m   Elderly adult female, lying in bed, no acute distress, when coached to use the IS, first she blows in it, next she inhales only 250cc. RRR no murmurs, no LE edema RR normal, no rales or wheezing on exam Abdomen soft without tenderness to palpation or guarding, no ascites or distension Attention normal, affect normal, speech fluent, strength symmetric, lower extremity deconditioning noted.  Data Reviewed: Paltelets 100K, Hgb stable low 7s. Basic metabolic panel shows stable Cr 1.6, otherwise unremarkable      Disposition: Status is: Inpatient Medically ready for discharge if SNF bed available, will need significant rehabilityion, unsafe to discharge home        Author: Edwin Dada, MD 08/28/2022 2:27 PM  For on call review [email or web address].

## 2022-08-28 NOTE — TOC Progression Note (Addendum)
Transition of Care [facility]) - Progression Note    Patient Details  Name: Beth Garrison MRN: [id number] Date of Birth: 1941-04-15  Transition of Care [facility]) CM/SW Contact  Fredrico Beedle, Juliann Pulse, RN Phone Number: 08/28/2022, 3:56 PM  Clinical Narrative:  Faxed out await bed offers, choice prior auth.  -Family declines [facility].    Expected Discharge Plan: Skilled Nursing Facility Barriers to Discharge: Continued Medical Work up  Expected Discharge Plan and Services Expected Discharge Plan: [place]                                               Social Determinants of Health (SDOH) Interventions    Readmission Risk Interventions     No data to display

## 2022-08-29 DIAGNOSIS — A419 Sepsis, unspecified organism: Secondary | ICD-10-CM | POA: Diagnosis not present

## 2022-08-29 DIAGNOSIS — R6520 Severe sepsis without septic shock: Secondary | ICD-10-CM | POA: Diagnosis not present

## 2022-08-29 DIAGNOSIS — E669 Obesity, unspecified: Secondary | ICD-10-CM

## 2022-08-29 LAB — BASIC METABOLIC PANEL
Anion gap: 6 (ref 5–15)
BUN: 22 mg/dL (ref 8–23)
CO2: 22 mmol/L (ref 22–32)
Calcium: 7.6 mg/dL — ABNORMAL LOW (ref 8.9–10.3)
Chloride: 105 mmol/L (ref 98–111)
Creatinine, Ser: 1.58 mg/dL — ABNORMAL HIGH (ref 0.44–1.00)
GFR, Estimated: 33 mL/min — ABNORMAL LOW (ref >60)
Glucose, Bld: 140 mg/dL — ABNORMAL HIGH (ref 70–99)
Potassium: 5 mmol/L (ref 3.5–5.1)
Sodium: 133 mmol/L — ABNORMAL LOW (ref 135–145)

## 2022-08-29 LAB — URINE CULTURE: Culture: NO GROWTH

## 2022-08-29 LAB — GLUCOSE, CAPILLARY
Glucose-Capillary: 134 mg/dL — ABNORMAL HIGH (ref 70–99)
Glucose-Capillary: 165 mg/dL — ABNORMAL HIGH (ref 70–99)
Glucose-Capillary: 226 mg/dL — ABNORMAL HIGH (ref 70–99)
Glucose-Capillary: 168 mg/dL — ABNORMAL HIGH (ref 70–99)

## 2022-08-29 LAB — FOLATE: Folate: 19.2 ng/mL (ref >5.9)

## 2022-08-29 LAB — CBC
HCT: 23.2 % — ABNORMAL LOW (ref 36.0–46.0)
Hemoglobin: 6.9 g/dL — CL (ref 12.0–15.0)
MCH: 28.8 pg (ref 26.0–34.0)
MCHC: 29.7 g/dL — ABNORMAL LOW (ref 30.0–36.0)
MCV: 96.7 fL (ref 80.0–100.0)
Platelets: 97 10*3/uL — ABNORMAL LOW (ref 150–400)
RBC: 2.4 MIL/uL — ABNORMAL LOW (ref 3.87–5.11)
RDW: 14.1 % (ref 11.5–15.5)
WBC: 9.1 10*3/uL (ref 4.0–10.5)
nRBC: 0.3 % — ABNORMAL HIGH (ref 0.0–0.2)

## 2022-08-29 LAB — HEMOGLOBIN AND HEMATOCRIT, BLOOD
HCT: 28.6 % — ABNORMAL LOW (ref 36.0–46.0)
Hemoglobin: 9 g/dL — ABNORMAL LOW (ref 12.0–15.0)

## 2022-08-29 LAB — IRON AND TIBC
Iron: 18 ug/dL — ABNORMAL LOW (ref 28–170)
Saturation Ratios: 18 % (ref 10.4–31.8)
TIBC: 102 ug/dL — ABNORMAL LOW (ref 250–450)
UIBC: 84 ug/dL

## 2022-08-29 LAB — RETICULOCYTES
Immature Retic Fract: 25.2 % — ABNORMAL HIGH (ref 2.3–15.9)
RBC.: 2.49 MIL/uL — ABNORMAL LOW (ref 3.87–5.11)
Retic Count, Absolute: 69 10*3/uL (ref 19.0–186.0)
Retic Ct Pct: 2.8 % (ref 0.4–3.1)

## 2022-08-29 LAB — PREPARE RBC (CROSSMATCH)

## 2022-08-29 LAB — VITAMIN B12: Vitamin B-12: 430 pg/mL (ref 180–914)

## 2022-08-29 LAB — FERRITIN: Ferritin: 1238 ng/mL — ABNORMAL HIGH (ref 11–307)

## 2022-08-29 MED ORDER — LOPERAMIDE HCL 2 MG PO CAPS
2.0000 mg | ORAL_CAPSULE | ORAL | Status: DC | PRN
  Administered 2022-08-29: 2 mg via ORAL
  Filled 2022-08-29: qty 1

## 2022-08-29 MED ORDER — SODIUM CHLORIDE 0.9% IV SOLUTION: Freq: Once | INTRAVENOUS | Status: AC

## 2022-08-29 NOTE — TOC Progression Note (Addendum)
Transition of Care [facility]) - Progression Note    Patient Details  Name: Beth Garrison MRN: [id number] Date of Birth: 31-Jan-1941  Transition of Care [facility]) CM/SW [place], RN Phone Number: 08/29/2022, 2:19 PM  Clinical Narrative: Presented bed offers to patient and called patient's daughter to advise of the ([email or web address]) list. Awaiting, patient's choice before initiating auth and SNF placement.       - 2:46p Patient and family chose [facility] care. Notified Kia with [facility] who advised will have a bed available whenever insurance Josem Kaufmann is completed.  Expected Discharge Plan: Skilled Nursing Facility Barriers to Discharge: Continued Medical Work up  Expected Discharge Plan and Services Expected Discharge Plan: [place]                                               Social Determinants of Health (SDOH) Interventions    Readmission Risk Interventions     No data to display

## 2022-08-29 NOTE — Progress Notes (Signed)
Occupational Therapy Treatment Patient Details Name: Beth Garrison MRN: [id number] DOB: March 25, 1941 Today's Date: 08/29/2022   History of present illness 81 yo female admitted with UTI, severe sepsis. Hx of DM, CKD, anemia   OT comments  Patient noted to be with deconditioning, generalized weakness, impaired standing balance, impaired functional mobility, decreased independence with self-care tasks, and compromised endurance. She required mod assist x2 to perform 3 total stands using a RW, with her initially demonstrating very limited standing tolerance. She required increased effort for progressive tasks, as well as intermittent rest breaks. She further needed max assist x2 for toileting at bedside commode level. She will benefit from further OT services to decrease the risk for further weakness and deconditioning, and to facilitate improved ADL performance.    Recommendations for follow up therapy are one component of a multi-disciplinary discharge planning process, led by the attending physician.  Recommendations may be updated based on patient status, additional functional criteria and insurance authorization.    Follow Up Recommendations  Skilled nursing-short term rehab (<3 hours/day)    Assistance Recommended at Discharge Frequent or constant Supervision/Assistance  Patient can return home with the following  A lot of help with bathing/dressing/bathroom;A lot of help with walking and/or transfers;Help with stairs or ramp for entrance         Precautions / Restrictions Precautions Precautions: Fall Restrictions Weight Bearing Restrictions: No       Mobility Bed Mobility Overal bed mobility: Needs Assistance Bed Mobility: Sit to Supine     Supine to sit: Mod assist, +2 for physical assistance, HOB elevated          Transfers Overall transfer level: Needs assistance Equipment used: Rolling walker (2 wheels) Transfers: Sit to/from Stand, Bed to chair/wheelchair/BSC Sit to  Stand: Mod assist, +2 physical assistance, +2 safety/equipment Stand pivot transfers: +2 physical assistance, +2 safety/equipment, Mod assist         General transfer comment: Pt performed 3 total stands, requiring cues for pushing up from transfer surface with at least 1 upper extremity, as well as trunk extension in standing.                ADL either performed or assessed with clinical judgement   ADL Overall ADL's : Needs assistance/impaired Eating/Feeding: Set up Eating/Feeding Details (indicate cue type and reason): based on clinical judgement Grooming: Set up;Sitting Grooming Details (indicate cue type and reason): She performed face washing seated EOB.             Lower Body Dressing: Total assistance   Toilet Transfer: +2 for physical assistance;BSC/3in1;Moderate assistance Toilet Transfer Details (indicate cue type and reason): general cues provided for RW placement and advancement, as well as step sequence and reaching back prior to sitting down Toileting- Clothing Manipulation and Hygiene: Total assistance;+2 for physical assistance;Sit to/from stand Toileting - Clothing Manipulation Details (indicate cue type and reason): Cues required to demo upright posture/trunk extension while in standing              Cognition Arousal/Alertness: Awake/alert Behavior During Therapy: WFL for tasks assessed/performed Overall Cognitive Status: Within Functional Limits for tasks assessed                         Pertinent Vitals/ Pain       Pain Assessment Pain Assessment: 0-10 Pain Score: 3  Pain Location: 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 Pain Intervention(s):  (Patient reported receiving medication for management of condition)  Frequency  Min 2X/week        Progress Toward Goals  OT Goals(current goals can now be found in the care plan section)   Patient is progressing towards goals  Acute Rehab OT Goals Patient Stated Goal:  to get better OT Goal Formulation: With patient Time For Goal Achievement: 09/10/22 Potential to Achieve Goals: Good  Plan         AM-PAC OT "6 Clicks" Daily Activity     Outcome Measure   Help from another person eating meals?: None Help from another person taking care of personal grooming?: A Little Help from another person toileting, which includes using toliet, bedpan, or urinal?: Total Help from another person bathing (including washing, rinsing, drying)?: A Lot Help from another person to put on and taking off regular upper body clothing?: A Lot Help from another person to put on and taking off regular lower body clothing?: Total 6 Click Score: 13    End of Session Equipment Utilized During Treatment: Rolling walker (2 wheels);Gait belt  OT Visit Diagnosis: Muscle weakness (generalized) (M62.81);Unsteadiness on feet (R26.81)   Activity Tolerance  (Fair overall tolerance)   Patient Left in bed;with call bell/phone within reach;with bed alarm set;with family/visitor present   Nurse Communication  (Nurse cleared the pt for therapy participation)        Time: 6861-6837 OT Time Calculation (min): 29 min  Charges: OT General Charges $OT Visit: 1 Visit OT Treatments $Self Care/Home Management : 8-22 mins $Therapeutic Activity: 8-22 mins    Leota Sauers, OTR/L 08/29/2022, 11:34 AM

## 2022-08-29 NOTE — Progress Notes (Signed)
Progress Note   Patient: Beth Garrison WUJ:[id number] DOB: [birth year] DOA: 08/23/2022     5 DOS: the patient was seen and examined on 08/29/2022 at 9:35AM      Brief hospital course: Mrs. Beth Garrison is a 81 y.o. F with DM, HTN, CKD IIIb baseline 1.3-1.6, and anemia who presented with fatigue, malaise, and shortness of breath.      9/20: Admitted to hospitalist service on fluids, antibiotics; worsened late in day, CCM consulted and transferred to ICU 9/21: Euglycemic DKA recognized overnight, started on insulin drip 9/22: Transitioned off insulin drip 9/24: AKI resolved, pending placement     Assessment and Plan: * Severe sepsis with end organ damage Tachypneic and leukocytosis at admission with encephalopathy, respiratory failure reuqiring BiPAP and hypotension.  Suspected UTI.    Urine culture insignificant growth.  Unfortunately no blood cultures obtained on admission.  No prior MDRO.  Prior urine cultures with Klebsiella, mostly susceptible.  Had one fever 2 days ago, in setting of atelectasis, appears nontoxic, WBC normalized.   - Continue Rocephin, day 7 of 7 - Follow repeat cultures      Normocytic anemia Chronic anemia.  Lower here due to critical illness, phlebotomy.  Will check vitamin/mineral stores to be sure. Hgb 6.9 g/dL today, no clinical bleeding. - Transfuse 1 unit - Transfusion threshold 7 g/dL - Check iron, ferritin, retics, B12, folate   HTN (hypertension) BP normal - Hold lisinopril - Stop midodrine    Hyperlipidemia with target LDL less than 100 - Hold pravastatin   Type II diabetes mellitus with manifestations (H[facility]) Glucose controlled - Hold home Farxiga - Continue Semglee, 8 units daily - Continue sliding scale corrections   Stable or resolved issues: Stage 3b chronic kidney disease (CKD) (HCC) Baseline 1.3-1.6.  AKI (acute kidney injury) ([place]) Cr 2.7 on admission, resolved to baseline 1.6  Acute metabolic acidosis Due to euglycemic  DKA due to [place].  pH less than 7.1 on admission, bicarb 5.  [name] stopped, treated with insulin and fluids.  Now resolved. -Stop Farxiga  Pressure injury of buttock, stage 1 Present on admission  Thrombocytopenia (HCC) Plts slightly down 97K today  Hypernatremia Resolved with fluids   Malnutrition of moderate degree - Glucerna - Dietitain consult  Hypothermia Resolved  Acute respiratory failure with hypoxia (HCC) Resolved.  Obesity BMI 30.9         Subjective: No new fever, no cough, no dyspnea, no confusion, no urinary symptoms.  Overall very weak.  No melena, no hematuria.     Physical Exam: BP (!) 135/49   Pulse 92   Temp 98.3 F (36.8 C) (Oral)   Resp 18   Ht '5\' 5"'$  (1.651 m)   Wt 84.3 kg   SpO2 100%   BMI 30.93 kg/m   Elderly adult female, lying in bed, appears weak and tired, responds to questions, eating breakfast RRR, no murmurs, no peripheral edema Respiratory rate normal, lungs clear, overall diminished no rales or wheezes Abdomen soft without tenderness palpation, no voluntary guarding, no ascites or distention Attention normal, affect blunted, judgment and insight appear normal for age, face symmetric, speech fluent, moves upper extremities with normal coordination, just generalized weakness    Data Reviewed: Cultures no growth to date Creatinine 1.58, no change Hemogram shows hemoglobin down to 6.8, platelets 97   Family Communication: Daughter at the bedside    Disposition: Status is: Inpatient This is a 81 year old F with diabetes who presented with euglycemic DKA, sepsis from a UTI.  This  appears all resolved, she is medically stable for discharge when bed is available, will follow blood cultures for completeness            Author: Edwin Dada, MD 08/29/2022 3:49 PM  For on call review [email or web address].

## 2022-08-30 DIAGNOSIS — E118 Type 2 diabetes mellitus with unspecified complications: Secondary | ICD-10-CM | POA: Diagnosis not present

## 2022-08-30 LAB — BPAM RBC
Blood Product Expiration Date: 202310172359
ISSUE DATE / TIME: 202309261014
Unit Type and Rh: 6200

## 2022-08-30 LAB — TYPE AND SCREEN
ABO/RH(D): A POS
Antibody Screen: NEGATIVE
Unit division: 0

## 2022-08-30 LAB — CBC
HCT: 26.1 % — ABNORMAL LOW (ref 36.0–46.0)
Hemoglobin: 8.2 g/dL — ABNORMAL LOW (ref 12.0–15.0)
MCH: 29 pg (ref 26.0–34.0)
MCHC: 31.4 g/dL (ref 30.0–36.0)
MCV: 92.2 fL (ref 80.0–100.0)
Platelets: 102 10*3/uL — ABNORMAL LOW (ref 150–400)
RBC: 2.83 MIL/uL — ABNORMAL LOW (ref 3.87–5.11)
RDW: 14.4 % (ref 11.5–15.5)
WBC: 8.3 10*3/uL (ref 4.0–10.5)
nRBC: 0.2 % (ref 0.0–0.2)

## 2022-08-30 LAB — BASIC METABOLIC PANEL
Anion gap: 7 (ref 5–15)
BUN: 18 mg/dL (ref 8–23)
CO2: 23 mmol/L (ref 22–32)
Calcium: 7.8 mg/dL — ABNORMAL LOW (ref 8.9–10.3)
Chloride: 106 mmol/L (ref 98–111)
Creatinine, Ser: 1.4 mg/dL — ABNORMAL HIGH (ref 0.44–1.00)
GFR, Estimated: 38 mL/min — ABNORMAL LOW (ref >60)
Glucose, Bld: 127 mg/dL — ABNORMAL HIGH (ref 70–99)
Potassium: 4.7 mmol/L (ref 3.5–5.1)
Sodium: 136 mmol/L (ref 135–145)

## 2022-08-30 LAB — GLUCOSE, CAPILLARY
Glucose-Capillary: 122 mg/dL — ABNORMAL HIGH (ref 70–99)
Glucose-Capillary: 180 mg/dL — ABNORMAL HIGH (ref 70–99)

## 2022-08-30 MED ORDER — IPRATROPIUM-ALBUTEROL 0.5-2.5 (3) MG/3ML IN SOLN: 3.0000 mL | Freq: Four times a day (QID) | RESPIRATORY_TRACT | Status: DC | PRN

## 2022-08-30 MED ORDER — ENOXAPARIN SODIUM 40 MG/0.4ML IJ SOSY: 40.0000 mg | PREFILLED_SYRINGE | INTRAMUSCULAR | Status: DC

## 2022-08-30 NOTE — Plan of Care (Signed)
  Problem: Education: Goal: Knowledge of General Education information will improve Description Including pain rating scale, medication(s)/side effects and non-pharmacologic comfort measures Outcome: Progressing   Problem: Clinical Measurements: Goal: Ability to maintain clinical measurements within normal limits will improve Outcome: Progressing   Problem: Activity: Goal: Risk for activity intolerance will decrease Outcome: Progressing   

## 2022-08-30 NOTE — TOC Transition Note (Addendum)
Transition of Care [facility]) - CM/SW Discharge Note   Patient Details  Name: Beth Garrison MRN: [id number] Date of Birth: 31-Mar-1941  Transition of Care [facility]) CM/SW Contact:  Roseanne Kaufman, RN Phone Number: 08/30/2022, 9:12 AM   Clinical Narrative:   Insurance auth approved, [facility] has bed available today. Notified MD, RN awaiting discharge summary. Left voicemail message for patient's daughter Sharyn Lull for a call back, also unable to reach patient on room phone.  TOC will continue to follow.    - 10:25am spoke with patient's daughter to advise of insurance auth approved. Discharge summary sent to [facility], notified Kia.  This RNCM is awaiting room number and number to call report to start [facility] transport.   Final next level of care: Skilled Nursing Facility Barriers to Discharge: Continued Medical Work up   Patient Goals and CMS Choice Patient states their goals for this hospitalization and ongoing recovery are:: go to rehab CMS Medicare.gov Compare Post Acute Care list provided to:: Patient Choice offered to / list presented to : Adult Children, Patient  Discharge Placement                       Discharge Plan and Services In-house Referral: NA Discharge Planning Services: CM Consult Post Acute Care Choice: NA          DME Arranged: N/A DME Agency: NA       HH Arranged: NA HH Agency: NA        Social Determinants of Health (SDOH) Interventions     Readmission Risk Interventions     No data to display

## 2022-08-30 NOTE — Progress Notes (Signed)
Pt complained of pain 9/10 in both legs. Pt wanted something stronger than Tylenol. Notified provider.

## 2022-08-30 NOTE — Discharge Summary (Signed)
[facility]  Physician Discharge Summary   Patient ID: Beth Garrison MRN: [id number] DOB/AGE: Dec 02, 1941 81 y.o.  Admit date: 08/23/2022 Discharge date:   08/30/2022   PCP: Mindi Curling, PA-C  DISCHARGE DIAGNOSES:    Severe sepsis ([place])   Type II diabetes mellitus with manifestations ([place])   Hyperlipidemia with target LDL less than 100   HTN (hypertension)   AKI (acute kidney injury) ([place])   Normocytic anemia   Stage 3b chronic kidney disease (CKD) ([place])   Acute respiratory failure with hypoxia (HCC)   Acute metabolic acidosis   Malnutrition of moderate degree   Hypernatremia   Thrombocytopenia (HCC)   Obesity (BMI 30-39.9)   RECOMMENDATIONS FOR OUTPATIENT FOLLOW UP: Please check CBC and basic metabolic panel in 4 to 5 days Monitor CBGs and adjust insulin dose as indicated.   Home Health: Going to SNF Equipment/Devices: None  CODE STATUS: Full code  DISCHARGE CONDITION: fair  Diet recommendation: Modified carbohydrate  INITIAL HISTORY: Beth Garrison is a 81 y.o. F with DM, HTN, CKD IIIb baseline 1.3-1.6, and anemia who presented with fatigue, malaise, and shortness of breath.    9/20: Admitted to hospitalist service on fluids, antibiotics; worsened late in day, CCM consulted and transferred to ICU 9/21: Euglycemic DKA recognized overnight, started on insulin drip 9/22: Transitioned off insulin drip    HOSPITAL COURSE:   Severe sepsis with end organ damage Tachypneic and leukocytosis at admission with encephalopathy, respiratory failure reuqiring BiPAP and hypotension.  Suspected UTI.   Urine culture insignificant growth.  Unfortunately no blood cultures obtained on admission.  No prior MDRO.  Prior urine cultures with Klebsiella, mostly susceptible. Is completed 7 days of ceftriaxone.  Blood cultures negative so far.    Normocytic anemia Chronic anemia.  Lower here due to critical illness, phlebotomy.  Hemoglobin dropped down to 6.9.  No overt  bleeding was noted.  Patient was transfused 1 unit of PRBC.  Hemoglobin improved to 8.2.  Ferritin noted to be 1238, iron 18, TIBC 102, percent saturation 18.  Folic acid 54.0.  Vitamin B12 430.    Essential hypertension Required midodrine due to sepsis.  Now off of it.  Holding lisinopril.  Monitor blood pressure trends at the nursing facility.   Hyperlipidemia with target LDL less than 100 May resume home medications   Type II diabetes mellitus with manifestations HbA1c 6.8.  Presented with euglycemic ketoacidosis.  Likely due to [place].  This has been discontinued.  May resume her [place].  Monitor CBGs.      Stage 3b chronic kidney disease (CKD) Baseline 1.3-1.6.   AKI (acute kidney injury) Cr 2.7 on admission, resolved to baseline   Acute metabolic acidosis Due to euglycemic DKA due to [place].  pH less than 7.1 on admission, bicarb 5.  [name] stopped, treated with insulin and fluids.  Now resolved. -Stop Farxiga   Thrombocytopenia ([place]) Platelet counts are stable.   Hypernatremia Resolved with fluids   Malnutrition of moderate degree - Glucerna - Dietitain consult   Hypothermia Resolved   Acute respiratory failure with hypoxia (HCC) Resolved.   Obesity Estimated body mass index is 30.93 kg/m as calculated from the following:   Height as of this encounter: '5\' 5"'$  (1.651 m).   Weight as of this encounter: 84.3 kg.  Pressure injury of buttock, stage 1 Present on admission Pressure Injury 08/26/22 Buttocks Lower;Medial Stage 1 -  Intact skin with non-blanchable redness of a localized area usually over a bony prominence. (Active)  08/26/22  1549  Location: Buttocks  Location Orientation: Lower;Medial  Staging: Stage 1 -  Intact skin with non-blanchable redness of a localized area usually over a bony prominence.  Wound Description (Comments):   Present on Admission: Yes   Moderate protein calorie malnutrition Nutrition Problem: Moderate Malnutrition Etiology:  chronic illness (diabetes) Signs/Symptoms: mild fat depletion, mild muscle depletion  Patient is stable.  Okay for discharge to SNF.   PERTINENT LABS:  The results of significant diagnostics from this hospitalization (including imaging, microbiology, ancillary and laboratory) are listed below for reference.    Microbiology: Recent Results (from the past 240 hour(s))  MRSA Next Gen by PCR, Nasal     Status: None   Collection Time: 08/23/22  4:33 PM   Specimen: Nasal Mucosa; Nasal Swab  Result Value Ref Range Status   MRSA by PCR Next Gen NOT DETECTED NOT DETECTED Final    Comment: (NOTE) The GeneXpert MRSA Assay (FDA approved for NASAL specimens only), is one component of a comprehensive MRSA colonization surveillance program. It is not intended to diagnose MRSA infection nor to guide or monitor treatment for MRSA infections. Test performance is not FDA approved in patients less than 35 years old. Performed at [facility], [place] [address] [place], [place] [address]   Respiratory (~20 pathogens) panel by PCR     Status: None   Collection Time: 08/23/22  6:42 PM  Result Value Ref Range Status   Adenovirus NOT DETECTED NOT DETECTED Final   Coronavirus 229E NOT DETECTED NOT DETECTED Final    Comment: (NOTE) The Coronavirus on the Respiratory Panel, DOES NOT test for the novel  Coronavirus (2019 nCoV)    Coronavirus HKU1 NOT DETECTED NOT DETECTED Final   Coronavirus NL63 NOT DETECTED NOT DETECTED Final   Coronavirus OC43 NOT DETECTED NOT DETECTED Final   Metapneumovirus NOT DETECTED NOT DETECTED Final   Rhinovirus / Enterovirus NOT DETECTED NOT DETECTED Final   Influenza A NOT DETECTED NOT DETECTED Final   Influenza B NOT DETECTED NOT DETECTED Final   Parainfluenza Virus 1 NOT DETECTED NOT DETECTED Final   Parainfluenza Virus 2 NOT DETECTED NOT DETECTED Final   Parainfluenza Virus 3 NOT DETECTED NOT DETECTED Final   Parainfluenza Virus 4 NOT DETECTED NOT  DETECTED Final   Respiratory Syncytial Virus NOT DETECTED NOT DETECTED Final   Bordetella pertussis NOT DETECTED NOT DETECTED Final   Bordetella Parapertussis NOT DETECTED NOT DETECTED Final   Chlamydophila pneumoniae NOT DETECTED NOT DETECTED Final   Mycoplasma pneumoniae NOT DETECTED NOT DETECTED Final    Comment: Performed at [facility], [place]. [address] [place], [place] [address]  Urine Culture     Status: Abnormal   Collection Time: 08/24/22 12:15 PM   Specimen: Urine, Clean Catch  Result Value Ref Range Status   Specimen Description   Final    URINE, CLEAN CATCH Performed at [facility], [place] [address] [place], [place] [address]    Special Requests   Final    NONE Performed at [facility], [place] [address] [place], [place] [address]    Culture (A)  Final    <10,000 COLONIES/mL INSIGNIFICANT GROWTH Performed at [place] [address], [place], [place] [address]    Report Status 08/25/2022 FINAL  Final  Urine Culture     Status: None   Collection Time: 08/28/22  7:32 AM   Specimen: Urine, Catheterized  Result Value Ref Range Status   Specimen Description  Final    URINE, CATHETERIZED Performed at [facility], [place] [address] [place], [place] [address]    Special Requests   Final    NONE Performed at [facility], [place] [address] [place], [place] [address]    Culture   Final    NO GROWTH Performed at [facility], [place] [address], [place], [place] [address]    Report Status 08/29/2022 FINAL  Final  Culture, blood (Routine X 2) w Reflex to ID Panel     Status: None (Preliminary result)   Collection Time: 08/28/22  8:21 AM   Specimen: BLOOD  Result Value Ref Range Status   Specimen Description   Final    BLOOD Performed at [place] [address], [place], [place] [address]    Special Requests   Final    BLOOD LEFT ARM AEROBIC BOTTLE ONLY  ANAEROBIC BOTTLE ONLY Performed at [place] [address] [place], [place] [address]    Culture   Final    NO GROWTH 2 DAYS Performed at [facility] [address], [place], [place] [address]    Report Status PENDING  Incomplete  Culture, blood (Routine X 2) w Reflex to ID Panel     Status: None (Preliminary result)   Collection Time: 08/28/22  8:35 AM   Specimen: BLOOD  Result Value Ref Range Status   Specimen Description   Final    BLOOD SITE NOT SPECIFIED Performed at [place] [address] [place], [place] [address]    Special Requests   Final    BOTTLES DRAWN AEROBIC AND ANAEROBIC Blood Culture results may not be optimal due to an inadequate volume of blood received in culture bottles Performed at [place] [address] [place], [place] [address]    Culture   Final    NO GROWTH 2 DAYS Performed at [facility] [address], [place], [place] [address]    Report Status PENDING  Incomplete     Labs:   Basic Metabolic Panel: Recent Labs  Lab 08/24/22 0656 08/24/22 0945 08/25/22 2023 08/26/22 0536 08/27/22 0420 08/28/22 0405 08/29/22 0446 08/30/22 0419  NA 149*   < > 144 146* 140 135 133* 136  K 4.0   < > 3.9 3.9 4.3 4.7 5.0 4.7  CL 130*   < > 114* 116* 110 106 105 106  CO2 9*   < > '22 24 24 24 22 23  '$ GLUCOSE 158*   < > 261* 205* 197* 152* 140* 127*  BUN 65*   < > 47* 44* 35* 26* 22 18  CREATININE 2.73*   < > 1.94* 1.94* 1.64* 1.61* 1.58* 1.40*  CALCIUM 8.1*   < > 7.6* 7.6* 7.6* 7.6* 7.6* 7.8*  MG 1.8  --  1.3* 1.9  --   --   --   --    < > = values in this interval not displayed.   Liver Function Tests: Recent Labs  Lab 08/23/22 1056 08/24/22 0656 08/25/22 0600 08/27/22 0420  AST 18 32 39 35  ALT '18 20 20 27  '$ ALKPHOS 127* 95 80 83  BILITOT 0.6 0.8 0.5 0.4  PROT 6.7 5.3* 4.6* 4.8*  ALBUMIN 3.5 2.8* 2.5* 2.3*    CBC: Recent Labs  Lab 08/23/22 1056  08/24/22 0656 08/26/22 0536 08/27/22 0420 08/28/22 0405 08/29/22 0446 08/29/22 1910 08/30/22 0419  WBC 12.1*   < >  11.5* 10.0 10.0 9.1  --  8.3  NEUTROABS 7.9*  --   --   --   --   --   --   --   HGB 10.8*   < > 7.1* 7.6* 7.1* 6.9* 9.0* 8.2*  HCT 36.0   < > 22.2* 24.5* 23.3* 23.2* 28.6* 26.1*  MCV 94.7   < > 89.9 92.5 92.8 96.7  --  92.2  PLT 225   < > 116* 118* 100* 97*  --  102*   < > = values in this interval not displayed.    BNP: BNP (last 3 results) Recent Labs    08/23/22 1056 08/23/22 2243  BNP 39.7 85.1    CBG: Recent Labs  Lab 08/29/22 0801 08/29/22 1204 08/29/22 1621 08/29/22 2047 08/30/22 0741  GLUCAP 134* 165* 226* 168* 122*     IMAGING STUDIES DG CHEST PORT 1 VIEW  Result Date: 08/28/2022 CLINICAL DATA:  Fever. EXAM: PORTABLE CHEST 1 VIEW COMPARISON:  August 24, 2022. FINDINGS: The heart size and mediastinal contours are within normal limits. Left lung is clear. Elevated right hemidiaphragm is noted with mild right infrahilar opacity concerning for atelectasis or infiltrate. The visualized skeletal structures are unremarkable. IMPRESSION: Elevated right hemidiaphragm is noted with mild right infrahilar opacity concerning for atelectasis or infiltrate. Electronically Signed   By: Marijo Conception M.D.   On: 08/28/2022 08:18   DG CHEST PORT 1 VIEW  Result Date: 08/24/2022 CLINICAL DATA:  Central line placement EXAM: PORTABLE CHEST 1 VIEW COMPARISON:  Yesterday FINDINGS: Right IJ line with tip at the upper cavoatrial junction. Stable and normal mediastinal contours allowing for rotation. No pneumothorax. Mild interstitial crowding at the bases. No effusion or consolidation. IMPRESSION: New right IJ line without complicating feature. Electronically Signed   By: Jorje Guild M.D.   On: 08/24/2022 04:57   US RENAL  Result Date: 08/23/2022 CLINICAL DATA:  Renal dysfunction EXAM: RENAL / URINARY TRACT ULTRASOUND COMPLETE COMPARISON:  07/07/2019 FINDINGS:  Right Kidney: Renal measurements: 9.2 x 4.7 x 4.8 cm = volume: 108.5 mL. There is no hydronephrosis. There is increased cortical echogenicity. Left Kidney: Renal measurements: 7.7 x 3.9 x 3.6 cm = volume: 55.5 mL. Left kidney is smaller than right. There is no hydronephrosis. There is increased cortical echogenicity. There is 1.5 cm anechoic structure, cyst in medial upper pole of left kidney. Bladder: Urinary bladder is not distended and not adequately evaluated. Other: None. IMPRESSION: There is no hydronephrosis. Increased cortical echogenicity suggests medical renal disease. Small left renal cyst. Electronically Signed   By: Elmer Picker M.D.   On: 08/23/2022 19:24   DG Chest Port 1 View  Result Date: 08/23/2022 CLINICAL DATA:  Respiratory distress. EXAM: PORTABLE CHEST 1 VIEW COMPARISON:  March 26, 2021. FINDINGS: The heart size and mediastinal contours are within normal limits. Both lungs are clear. The visualized skeletal structures are unremarkable. IMPRESSION: No active disease. Electronically Signed   By: Marijo Conception M.D.   On: 08/23/2022 11:13   DG Knee Complete 4 Views Right  Result Date: 08/10/2022 CLINICAL DATA:  [place]. Right knee pain. EXAM: RIGHT KNEE - COMPLETE 4+ VIEW COMPARISON:  None Available. FINDINGS: The joint spaces are fairly well maintained for age. No significant degenerative changes. No acute fracture is identified. No osteochondral abnormality. No joint effusion. Advanced vascular calcifications are noted. IMPRESSION: No acute bony findings or joint effusion. Electronically Signed   By: Marijo Sanes M.D.   On: 08/10/2022 11:17  DISCHARGE EXAMINATION: Vitals:   08/29/22 1405 08/29/22 2051 08/30/22 0426 08/30/22 0743  BP: (!) 135/49 (!) 146/50 (!) 116/48   Pulse: 92 89 79   Resp: '18 19 18   '$ Temp: 98.3 F (36.8 C) 98.2 F (36.8 C) 98.8 F (37.1 C)   TempSrc: Oral Oral Oral   SpO2:  96% 95% 95%  Weight:      Height:       General appearance: Awake alert.   In no distress Resp: Clear to auscultation bilaterally.  Normal effort Cardio: S1-S2 is normal regular.  No S3-S4.  No rubs murmurs or bruit GI: Abdomen is soft.  Nontender nondistended.  Bowel sounds are present normal.  No masses organomegaly   DISPOSITION: SNF  Discharge Instructions     Call MD for:  difficulty breathing, headache or visual disturbances   Complete by: As directed    Call MD for:  extreme fatigue   Complete by: As directed    Call MD for:  persistant dizziness or light-headedness   Complete by: As directed    Call MD for:  persistant nausea and vomiting   Complete by: As directed    Call MD for:  severe uncontrolled pain   Complete by: As directed    Call MD for:  temperature >100.4   Complete by: As directed    Diet Carb Modified   Complete by: As directed    Discharge instructions   Complete by: As directed    Please review instructions on the discharge summary.  You were cared for by a hospitalist during your hospital stay. If you have any questions about your discharge medications or the care you received while you were in the hospital after you are discharged, you can call the unit and asked to speak with the hospitalist on call if the hospitalist that took care of you is not available. Once you are discharged, your primary care physician will handle any further medical issues. Please note that NO REFILLS for any discharge medications will be authorized once you are discharged, as it is imperative that you return to your primary care physician (or establish a relationship with a primary care physician if you do not have one) for your aftercare needs so that they can reassess your need for medications and monitor your lab values. If you do not have a primary care physician, you can call [phone] for a physician referral.   Increase activity slowly   Complete by: As directed    No wound care   Complete by: As directed          Allergies as of 08/30/2022        Reactions   Dulaglutide Diarrhea   Gabapentin Diarrhea        Medication List     STOP taking these medications    Farxiga 10 MG Tabs tablet Generic drug: dapagliflozin propanediol   Levemir FlexPen 100 UNIT/ML FlexPen Generic drug: insulin detemir   lisinopril 20 MG tablet Commonly known as: ZESTRIL       TAKE these medications    acetaminophen 500 MG tablet Commonly known as: TYLENOL Take 500 mg by mouth every 6 (six) hours as needed for mild pain.   diclofenac Sodium 1 % Gel Commonly known as: Voltaren Apply 2 g topically 4 (four) times daily. What changed:  when to take this reasons to take this   FeroSul 325 (65 FE) MG tablet Generic drug: ferrous sulfate Take 325 mg by mouth daily.  ipratropium-albuterol 0.5-2.5 (3) MG/3ML Soln Commonly known as: DUONEB Take 3 mLs by nebulization every 6 (six) hours as needed (shortness of breath).   multivitamin with minerals Tabs tablet Take 1 tablet by mouth daily.   pantoprazole 40 MG tablet Commonly known as: PROTONIX Take 1 tablet (40 mg total) by mouth 2 (two) times daily.   pravastatin 80 MG tablet Commonly known as: PRAVACHOL Take 80 mg by mouth daily.   Tyler Aas FlexTouch 200 UNIT/ML FlexTouch Pen Generic drug: insulin degludec Inject 10 Units into the skin daily.          Follow-up Information     Mindi Curling, PA-C Follow up in 1 week(s).   Specialty: Physician Assistant Contact information: [place] [phone]                 TOTAL DISCHARGE TIME: 27 minutes  [place]  [facility] Pager on [email or web address]  08/30/2022, 9:47 AM

## 2022-09-02 LAB — CULTURE, BLOOD (ROUTINE X 2)
Culture: NO GROWTH
Culture: NO GROWTH

## 2022-10-07 ENCOUNTER — Other Ambulatory Visit: Payer: Self-pay

## 2022-10-07 ENCOUNTER — Inpatient Hospital Stay (HOSPITAL_COMMUNITY)
Admission: EM | Admit: 2022-10-07 | Discharge: 2022-10-16 | DRG: 444 | Disposition: A | Discharge status: Hospice | Payer: Medicare Other | Attending: Family Medicine | Admitting: Family Medicine

## 2022-10-07 ENCOUNTER — Encounter (HOSPITAL_COMMUNITY): Payer: Self-pay | Admitting: Internal Medicine

## 2022-10-07 ENCOUNTER — Emergency Department (HOSPITAL_COMMUNITY): Payer: Medicare Other

## 2022-10-07 DIAGNOSIS — Z7189 Other specified counseling: Secondary | ICD-10-CM | POA: Diagnosis not present

## 2022-10-07 DIAGNOSIS — K8689 Other specified diseases of pancreas: Secondary | ICD-10-CM | POA: Diagnosis not present

## 2022-10-07 DIAGNOSIS — E118 Type 2 diabetes mellitus with unspecified complications: Secondary | ICD-10-CM | POA: Diagnosis not present

## 2022-10-07 DIAGNOSIS — L89620 Pressure ulcer of left heel, unstageable: Secondary | ICD-10-CM | POA: Diagnosis present

## 2022-10-07 DIAGNOSIS — D649 Anemia, unspecified: Secondary | ICD-10-CM | POA: Diagnosis not present

## 2022-10-07 DIAGNOSIS — C801 Malignant (primary) neoplasm, unspecified: Secondary | ICD-10-CM | POA: Diagnosis not present

## 2022-10-07 DIAGNOSIS — D630 Anemia in neoplastic disease: Secondary | ICD-10-CM | POA: Diagnosis present

## 2022-10-07 DIAGNOSIS — I10 Essential (primary) hypertension: Secondary | ICD-10-CM | POA: Diagnosis not present

## 2022-10-07 DIAGNOSIS — R634 Abnormal weight loss: Secondary | ICD-10-CM | POA: Diagnosis not present

## 2022-10-07 DIAGNOSIS — E785 Hyperlipidemia, unspecified: Secondary | ICD-10-CM | POA: Diagnosis present

## 2022-10-07 DIAGNOSIS — C786 Secondary malignant neoplasm of retroperitoneum and peritoneum: Secondary | ICD-10-CM | POA: Diagnosis present

## 2022-10-07 DIAGNOSIS — Z66 Do not resuscitate: Secondary | ICD-10-CM | POA: Diagnosis present

## 2022-10-07 DIAGNOSIS — R188 Other ascites: Secondary | ICD-10-CM | POA: Diagnosis not present

## 2022-10-07 DIAGNOSIS — E1122 Type 2 diabetes mellitus with diabetic chronic kidney disease: Secondary | ICD-10-CM | POA: Diagnosis present

## 2022-10-07 DIAGNOSIS — Z79899 Other long term (current) drug therapy: Secondary | ICD-10-CM

## 2022-10-07 DIAGNOSIS — I821 Thrombophlebitis migrans: Secondary | ICD-10-CM | POA: Diagnosis present

## 2022-10-07 DIAGNOSIS — E440 Moderate protein-calorie malnutrition: Secondary | ICD-10-CM | POA: Diagnosis present

## 2022-10-07 DIAGNOSIS — I82453 Acute embolism and thrombosis of peroneal vein, bilateral: Secondary | ICD-10-CM | POA: Diagnosis present

## 2022-10-07 DIAGNOSIS — I82433 Acute embolism and thrombosis of popliteal vein, bilateral: Secondary | ICD-10-CM | POA: Diagnosis present

## 2022-10-07 DIAGNOSIS — R630 Anorexia: Secondary | ICD-10-CM | POA: Diagnosis present

## 2022-10-07 DIAGNOSIS — Z8349 Family history of other endocrine, nutritional and metabolic diseases: Secondary | ICD-10-CM

## 2022-10-07 DIAGNOSIS — C762 Malignant neoplasm of abdomen: Secondary | ICD-10-CM | POA: Diagnosis not present

## 2022-10-07 DIAGNOSIS — I82413 Acute embolism and thrombosis of femoral vein, bilateral: Secondary | ICD-10-CM | POA: Diagnosis present

## 2022-10-07 DIAGNOSIS — Z6828 Body mass index (BMI) 28.0-28.9, adult: Secondary | ICD-10-CM

## 2022-10-07 DIAGNOSIS — N1832 Chronic kidney disease, stage 3b: Secondary | ICD-10-CM | POA: Diagnosis present

## 2022-10-07 DIAGNOSIS — L89610 Pressure ulcer of right heel, unstageable: Secondary | ICD-10-CM | POA: Diagnosis present

## 2022-10-07 DIAGNOSIS — D689 Coagulation defect, unspecified: Secondary | ICD-10-CM | POA: Diagnosis not present

## 2022-10-07 DIAGNOSIS — Z515 Encounter for palliative care: Secondary | ICD-10-CM

## 2022-10-07 DIAGNOSIS — K831 Obstruction of bile duct: Principal | ICD-10-CM | POA: Diagnosis present

## 2022-10-07 DIAGNOSIS — Z794 Long term (current) use of insulin: Secondary | ICD-10-CM

## 2022-10-07 DIAGNOSIS — R609 Edema, unspecified: Secondary | ICD-10-CM

## 2022-10-07 DIAGNOSIS — C259 Malignant neoplasm of pancreas, unspecified: Secondary | ICD-10-CM | POA: Diagnosis present

## 2022-10-07 DIAGNOSIS — R180 Malignant ascites: Secondary | ICD-10-CM | POA: Diagnosis present

## 2022-10-07 DIAGNOSIS — Z87891 Personal history of nicotine dependence: Secondary | ICD-10-CM

## 2022-10-07 DIAGNOSIS — D65 Disseminated intravascular coagulation [defibrination syndrome]: Secondary | ICD-10-CM | POA: Diagnosis present

## 2022-10-07 DIAGNOSIS — I129 Hypertensive chronic kidney disease with stage 1 through stage 4 chronic kidney disease, or unspecified chronic kidney disease: Secondary | ICD-10-CM | POA: Diagnosis present

## 2022-10-07 DIAGNOSIS — Z888 Allergy status to other drugs, medicaments and biological substances status: Secondary | ICD-10-CM

## 2022-10-07 DIAGNOSIS — I2699 Other pulmonary embolism without acute cor pulmonale: Secondary | ICD-10-CM | POA: Diagnosis present

## 2022-10-07 DIAGNOSIS — K219 Gastro-esophageal reflux disease without esophagitis: Secondary | ICD-10-CM | POA: Diagnosis present

## 2022-10-07 DIAGNOSIS — R17 Unspecified jaundice: Secondary | ICD-10-CM | POA: Diagnosis not present

## 2022-10-07 DIAGNOSIS — R109 Unspecified abdominal pain: Secondary | ICD-10-CM | POA: Diagnosis present

## 2022-10-07 DIAGNOSIS — E871 Hypo-osmolality and hyponatremia: Secondary | ICD-10-CM | POA: Diagnosis present

## 2022-10-07 DIAGNOSIS — I82403 Acute embolism and thrombosis of unspecified deep veins of lower extremity, bilateral: Secondary | ICD-10-CM | POA: Diagnosis not present

## 2022-10-07 DIAGNOSIS — Z833 Family history of diabetes mellitus: Secondary | ICD-10-CM

## 2022-10-07 DIAGNOSIS — Z7901 Long term (current) use of anticoagulants: Secondary | ICD-10-CM

## 2022-10-07 DIAGNOSIS — N179 Acute kidney failure, unspecified: Secondary | ICD-10-CM | POA: Diagnosis present

## 2022-10-07 LAB — COMPREHENSIVE METABOLIC PANEL
ALT: 131 U/L — ABNORMAL HIGH (ref 0–44)
AST: 265 U/L — ABNORMAL HIGH (ref 15–41)
Albumin: 2.3 g/dL — ABNORMAL LOW (ref 3.5–5.0)
Alkaline Phosphatase: 3364 U/L — ABNORMAL HIGH (ref 38–126)
Anion gap: 10 (ref 5–15)
BUN: 34 mg/dL — ABNORMAL HIGH (ref 8–23)
CO2: 17 mmol/L — ABNORMAL LOW (ref 22–32)
Calcium: 7.9 mg/dL — ABNORMAL LOW (ref 8.9–10.3)
Chloride: 105 mmol/L (ref 98–111)
Creatinine, Ser: 2.2 mg/dL — ABNORMAL HIGH (ref 0.44–1.00)
GFR, Estimated: 22 mL/min — ABNORMAL LOW (ref >60)
Glucose, Bld: 116 mg/dL — ABNORMAL HIGH (ref 70–99)
Potassium: 4 mmol/L (ref 3.5–5.1)
Sodium: 132 mmol/L — ABNORMAL LOW (ref 135–145)
Total Bilirubin: 9.4 mg/dL — ABNORMAL HIGH (ref 0.3–1.2)
Total Protein: 5.6 g/dL — ABNORMAL LOW (ref 6.5–8.1)

## 2022-10-07 LAB — CBC WITH DIFFERENTIAL/PLATELET
Abs Immature Granulocytes: 0.24 10*3/uL — ABNORMAL HIGH (ref 0.00–0.07)
Basophils Absolute: 0.1 10*3/uL (ref 0.0–0.1)
Basophils Relative: 0 %
Eosinophils Absolute: 0.1 10*3/uL (ref 0.0–0.5)
Eosinophils Relative: 1 %
HCT: 30.4 % — ABNORMAL LOW (ref 36.0–46.0)
Hemoglobin: 9.5 g/dL — ABNORMAL LOW (ref 12.0–15.0)
Immature Granulocytes: 2 %
Lymphocytes Relative: 13 %
Lymphs Abs: 1.8 10*3/uL (ref 0.7–4.0)
MCH: 28.8 pg (ref 26.0–34.0)
MCHC: 31.3 g/dL (ref 30.0–36.0)
MCV: 92.1 fL (ref 80.0–100.0)
Monocytes Absolute: 0.6 10*3/uL (ref 0.1–1.0)
Monocytes Relative: 4 %
Neutro Abs: 11.7 10*3/uL — ABNORMAL HIGH (ref 1.7–7.7)
Neutrophils Relative %: 80 %
Platelets: 233 10*3/uL (ref 150–400)
RBC: 3.3 MIL/uL — ABNORMAL LOW (ref 3.87–5.11)
RDW: 19.4 % — ABNORMAL HIGH (ref 11.5–15.5)
WBC: 14.5 10*3/uL — ABNORMAL HIGH (ref 4.0–10.5)
nRBC: 0 % (ref 0.0–0.2)

## 2022-10-07 LAB — PROTIME-INR
INR: 4 — ABNORMAL HIGH (ref 0.8–1.2)
Prothrombin Time: 38.3 seconds — ABNORMAL HIGH (ref 11.4–15.2)

## 2022-10-07 LAB — LACTIC ACID, PLASMA
Lactic Acid, Venous: 1.8 mmol/L (ref 0.5–1.9)
Lactic Acid, Venous: 1.2 mmol/L (ref 0.5–1.9)

## 2022-10-07 LAB — APTT: aPTT: 50 seconds — ABNORMAL HIGH (ref 24–36)

## 2022-10-07 LAB — HEPARIN LEVEL (UNFRACTIONATED): Heparin Unfractionated: <0.1 IU/mL — ABNORMAL LOW (ref 0.30–0.70)

## 2022-10-07 LAB — LIPASE, BLOOD: Lipase: 19 U/L (ref 11–51)

## 2022-10-07 LAB — GLUCOSE, CAPILLARY: Glucose-Capillary: 92 mg/dL (ref 70–99)

## 2022-10-07 MED ORDER — LACTATED RINGERS IV BOLUS
1000.0000 mL | Freq: Once | INTRAVENOUS | Status: AC
  Administered 2022-10-07: 1000 mL via INTRAVENOUS

## 2022-10-07 MED ORDER — HEPARIN (PORCINE) 25000 UT/250ML-% IV SOLN
1050.0000 [IU]/h | INTRAVENOUS | Status: DC
  Administered 2022-10-07: 1050 [IU]/h via INTRAVENOUS
  Filled 2022-10-07: qty 250

## 2022-10-07 MED ORDER — ONDANSETRON HCL 4 MG/2ML IJ SOLN
4.0000 mg | Freq: Four times a day (QID) | INTRAMUSCULAR | Status: DC | PRN
  Administered 2022-10-11: 4 mg via INTRAVENOUS
  Filled 2022-10-07: qty 2

## 2022-10-07 MED ORDER — IOHEXOL 300 MG/ML  SOLN
100.0000 mL | Freq: Once | INTRAMUSCULAR | Status: AC | PRN
  Administered 2022-10-07: 100 mL via INTRAVENOUS

## 2022-10-07 MED ORDER — ONDANSETRON HCL 4 MG PO TABS: 4.0000 mg | ORAL_TABLET | Freq: Four times a day (QID) | ORAL | Status: DC | PRN

## 2022-10-07 MED ORDER — PIPERACILLIN-TAZOBACTAM 3.375 G IVPB
3.3750 g | Freq: Three times a day (TID) | INTRAVENOUS | Status: DC
  Administered 2022-10-07 – 2022-10-09 (×6): 3.375 g via INTRAVENOUS
  Filled 2022-10-07 (×6): qty 50

## 2022-10-07 MED ORDER — HEPARIN BOLUS VIA INFUSION
1900.0000 [IU] | Freq: Once | INTRAVENOUS | Status: AC
  Administered 2022-10-07: 1900 [IU] via INTRAVENOUS
  Filled 2022-10-07: qty 1900

## 2022-10-07 MED ORDER — LACTATED RINGERS IV SOLN: INTRAVENOUS | Status: DC

## 2022-10-07 MED ORDER — HYDROMORPHONE HCL 1 MG/ML IJ SOLN
0.5000 mg | INTRAMUSCULAR | Status: AC | PRN
  Administered 2022-10-08 (×3): 0.5 mg via INTRAVENOUS
  Filled 2022-10-07 (×3): qty 0.5

## 2022-10-07 MED ORDER — HYDROMORPHONE HCL 1 MG/ML IJ SOLN
0.7500 mg | Freq: Once | INTRAMUSCULAR | Status: AC
  Administered 2022-10-07: 0.75 mg via INTRAVENOUS
  Filled 2022-10-07: qty 1

## 2022-10-07 NOTE — H&P (Signed)
History and Physical    Patient: Beth Garrison EZM:[id number] DOB: 04-Oct-1941 DOA: 10/07/2022 DOS: the patient was seen and examined on 10/07/2022 PCP: Mindi Curling, PA-C  Patient coming from: Home  Chief Complaint:  Chief Complaint  Patient presents with   Abdominal Pain    uti   HPI: Beth Garrison is a 81 y.o. female with medical history significant of  AKI, stage IIIa CKD, hyperkalemia, history of melena, history of sepsis, type II DM, type 2 diabetes, GERD, hyperlipidemia, hypertension, intractable nausea and vomiting, unspecified anemia who is brought via EMS due to abdominal pain, decreased appetite, nausea, weight loss and generalized weakness for the past 2 weeks.  Her stool has been lighter in color.  Her urine has been darker.  She and her daughter denied fever, chills, rhinorrhea, sore throat, wheezing or hemoptysis.  No chest pain, palpitations, diaphoresis, PND, orthopnea or pitting edema of the lower extremities.  No diarrhea, constipation, melena or hematochezia.  No flank pain, dysuria, frequency or hematuria.  No polyuria, polydipsia, polyphagia or blurred vision.   ED course: Initial vital signs were temperature 98.1 F, pulse 114, respiration 24, BP 126/51 mmHg and O2 sat 99% on room air.  The patient received LR 2000 mL bolus and was started on heparin.  Lab work: Her CBC showed a white count 14.5, hemoglobin 9.5 g/dL platelets 233.  Lactic acid was normal x2.  Lipase 19 units/L.  PT 38.3, INR 4.0 and PTT 50.  CMP showed a sodium 132 and CO2 of 17 mmol/L.  The rest of the electrolytes were normal after calcium correction.  Glucose 116, BUN 34 and creatinine 2.2 mg/dL.  Her most recent creatinine level was 1.4.  Total protein 5.6 and albumin 2.3 g/dL.  AST 265, ALT 131 and alkaline phosphatase 3364 units/L.  Total bilirubin 9.4 mg/dL.  Imaging: Portable 1 view chest radiograph with patchy airspace opacity in the periphery of the left lung base.  CT abdomen/pelvis with bilateral  lower extremity DVT and suspicion for right lower lobe segmental pulmonary emboli.  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  Ill-defined mass in the uncinate process of the pancreas.  Associated pancreatic ductal dilatation, diffuse pancreatic atrophy and significant dilatation of the CBD and gallbladder.  Indeterminate left adrenal nodule measuring 1.7 cm which could be an adenoma or metastatic implant.  Extensive atherosclerotic vascular disease with calcifications throughout the aorta and branch vessels.  Significant stenosis of the left renal artery given relative atrophy of the left kidney.   Review of Systems: As mentioned in the history of present illness. All other systems reviewed and are negative. Past Medical History:  Diagnosis Date   AKI (acute kidney injury) ([facility]) 07/07/2019   Diabetes mellitus without complication ([place])    GERD (gastroesophageal reflux disease)    Hyperlipidemia    Hypertension    Past Surgical History:  Procedure Laterality Date   BIOPSY  07/10/2019   Procedure: BIOPSY;  Surgeon: Carol Ada, MD;  Location: [facility];  Service: Endoscopy;;   ESOPHAGOGASTRODUODENOSCOPY (EGD) WITH PROPOFOL Left 07/10/2019   Procedure: ESOPHAGOGASTRODUODENOSCOPY (EGD) WITH PROPOFOL;  Surgeon: Carol Ada, MD;  Location: [facility];  Service: Endoscopy;  Laterality: Left;   TONSILLECTOMY AND ADENOIDECTOMY     Social History:  reports that she has never smoked. She has never used smokeless tobacco. She reports that she does not drink alcohol and does not use drugs.  Allergies  Allergen Reactions   Dulaglutide Diarrhea   Gabapentin Diarrhea    Family  History  Problem Relation Age of Onset   Diabetes Mother    Hyperlipidemia Mother    Diabetes Father    Diabetes Brother    Diabetes Maternal Grandmother    Diabetes Paternal Grandmother    Cancer Neg Hx    Stroke Neg Hx     Prior to Admission medications   Medication Sig Start Date End Date Taking?  Authorizing Provider  acetaminophen (TYLENOL) 500 MG tablet Take 500 mg by mouth every 6 (six) hours as needed for mild pain.    [provider]  diclofenac Sodium (VOLTAREN) 1 % GEL Apply 2 g topically 4 (four) times daily. Patient taking differently: Apply 2 g topically daily as needed. 08/10/22   Varney Biles, MD  FEROSUL 325 (65 Fe) MG tablet Take 325 mg by mouth daily. Patient not taking: Reported on 08/23/2022 02/21/21   [provider]  ipratropium-albuterol (DUONEB) 0.5-2.5 (3) MG/3ML SOLN Take 3 mLs by nebulization every 6 (six) hours as needed (shortness of breath). 08/30/22   Bonnielee Haff, MD  Multiple Vitamin (MULTIVITAMIN WITH MINERALS) TABS tablet Take 1 tablet by mouth daily.    [provider]  pantoprazole (PROTONIX) 40 MG tablet Take 1 tablet (40 mg total) by mouth 2 (two) times daily. 07/16/19   Monica Becton, MD  pravastatin (PRAVACHOL) 80 MG tablet Take 80 mg by mouth daily. 08/15/22   [provider]  TRESIBA FLEXTOUCH 200 UNIT/ML FlexTouch Pen Inject 10 Units into the skin daily. 11/29/20   [provider]    Physical Exam: Vitals:   10/07/22 1038 10/07/22 1100 10/07/22 1130 10/07/22 1215  BP: (!) 132/51 (!) 137/59 (!) 121/55 (!) 120/48  Pulse: (!) 109 (!) 104 (!) 103 95  Resp: 20 16 (!) 22 (!) 22  Temp:      TempSrc:      SpO2: 96% 99% 100% 99%  Weight:      Height:       Physical Exam Vitals and nursing note reviewed.  Constitutional:      Appearance: She is well-developed. She is ill-appearing.  HENT:     Head: Normocephalic.     Nose: No rhinorrhea.     Mouth/Throat:     Mouth: Mucous membranes are dry.  Eyes:     General: Scleral icterus present.     Pupils: Pupils are equal, round, and reactive to light.  Cardiovascular:     Rate and Rhythm: Normal rate and regular rhythm.  Pulmonary:     Effort: Pulmonary effort is normal.     Breath sounds: Normal breath sounds.  Abdominal:     General: Abdomen  is protuberant. Bowel sounds are normal.     Palpations: Abdomen is soft.     Tenderness: There is no abdominal tenderness.  Musculoskeletal:     Cervical back: Neck supple.     Right lower leg: No edema.     Left lower leg: No edema.  Skin:    General: Skin is warm and dry.  Neurological:     General: No focal deficit present.     Mental Status: She is alert. Mental status is at baseline.  Psychiatric:        Mood and Affect: Mood normal.     Data Reviewed:  There are no new results to review at this time.  Assessment and Plan: Principal Problem:   Hyperbilirubinemia In the setting of new   Pancreatic mass Admit to PCU/inpatient. Continue IV fluids. Full liquid diet. Keep n.p.o.  after midnight. Analgesics as needed. Antiemetics as needed. Pantoprazole 40 mg IVP daily. Zosyn 3.375 g IVPB every 8 hours. Follow CBC, CMP in AM. GI consult appreciated.  Active Problems:   AKI (acute kidney injury) ([facility]) Superimposed on:   Stage 3b chronic kidney disease (CKD) (H[facility]) Continue IV fluids. Avoid hypotension. Avoid nephrotoxins. Monitor intake and output. Monitor renal function electrolytes.    Hyponatremia Due to poor oral intake. Monitor sodium level.    DVT, bilateral lower limbs ([facility]) Started on heparin per pharmacy. Monitor closely given coagulopathy.    Type II diabetes mellitus with manifestations (HCC) CBG monitoring every 6 hours while NPO.    Hyperlipidemia with target LDL less than 100 Hold pravastatin.    HTN (hypertension) Not on hypertensive. Monitor blood pressure.    Normocytic anemia Monitor hematocrit and hemoglobin.   Advance Care Planning:   Code Status: Full code.  Consults: [facility].  Family Communication:   Severity of Illness: The appropriate patient status for this patient is INPATIENT. Inpatient status is judged to be reasonable and necessary in order to provide the required intensity of service to ensure the patient's  safety. The patient's presenting symptoms, physical exam findings, and initial radiographic and laboratory data in the context of their chronic comorbidities is felt to place them at high risk for further clinical deterioration. Furthermore, it is not anticipated that the patient will be medically stable for discharge from the hospital within 2 midnights of admission.   * I certify that at the point of admission it is my clinical judgment that the patient will require inpatient hospital care spanning beyond 2 midnights from the point of admission due to high intensity of service, high risk for further deterioration and high frequency of surveillance required.*  Author: Reubin Milan, MD 10/07/2022 1:01 PM  For on call review [email or web address].   This document was prepared using Dragon voice recognition software and may contain some unintended transcription errors.

## 2022-10-07 NOTE — Progress Notes (Signed)
ANTICOAGULATION CONSULT NOTE - Initial Consult  Pharmacy Consult for heparin Indication:  VTE treatment  Allergies  Allergen Reactions   Dulaglutide Diarrhea   Gabapentin Diarrhea    Patient Measurements: Height: '5\' 5"'$  (165.1 cm) Weight: 77.1 kg (170 lb) IBW/kg (Calculated) : 57 Heparin Dosing Weight: 73 kg  Vital Signs: Temp: 98.1 F (36.7 C) (11/04 0935) Temp Source: Oral (11/04 0935) BP: 120/48 (11/04 1215) Pulse Rate: 95 (11/04 1215)  Labs: Recent Labs    10/07/22 0939  HGB 9.5*  HCT 30.4*  PLT 233  CREATININE 2.20*    Estimated Creatinine Clearance: 20.9 mL/min (A) (by C-G formula based on SCr of 2.2 mg/dL (H)).   Medical History: Past Medical History:  Diagnosis Date   AKI (acute kidney injury) ([place]) 07/07/2019   Diabetes mellitus without complication (HCC)    GERD (gastroesophageal reflux disease)    Hyperlipidemia    Hypertension       Assessment: 81 year old female presented with abdominal pain, weakness. No anticoagulation noted PTA, baseline CBC consistent with baseline. Pharmacy consulted for heparin management.  CT abd/pelvis with: Bilateral lower extremity DVT. Recommend dedicated bilateral lower extremity duplex venous ultrasound to confirm and assess extent. Suspect right lower lobe segmental pulmonary emboli. CTA of the chest recommended for confirmation.  Goal of Therapy:  Heparin level 0.3-0.7 units/ml Monitor platelets by anticoagulation protocol: Yes   Plan:  Per [place] calculator: -Heparin 1900 unit bolus -Heparin infusion at 1050 units/hr  -Check heparin level ~ 8 hrs after start of infusion -Daily CBC while on heparin infusion -Monitor for s/sx bleeding, transition to oral anticoagulation as indicated  Tawnya Crook, PharmD, BCPS Clinical Pharmacist 10/07/2022 12:41 PM

## 2022-10-07 NOTE — ED Notes (Addendum)
Contacted lab to inquire about CMP & lipase resulting. Lab reports results will be uploaded.

## 2022-10-07 NOTE — ED Provider Notes (Signed)
[facility] DEPT Provider Note   CSN: [id number] Arrival date & time: 10/07/22  8828     History  No chief complaint on file.   Beth Garrison is a 81 y.o. female.  81 year old female presents with several days of weakness as well as anorexia.  Patient also notes increasing dyspnea on exertion.  Some dysuria as well as foul-smelling urine.  Slight right upper quadrant abdominal discomfort.  Denies any emesis.  No diarrhea or bloody stools.  Called EMS and transported here       Home Medications Prior to Admission medications   Medication Sig Start Date End Date Taking? Authorizing Provider  acetaminophen (TYLENOL) 500 MG tablet Take 500 mg by mouth every 6 (six) hours as needed for mild pain.    [provider]  diclofenac Sodium (VOLTAREN) 1 % GEL Apply 2 g topically 4 (four) times daily. Patient taking differently: Apply 2 g topically daily as needed. 08/10/22   Varney Biles, MD  FEROSUL 325 (65 Fe) MG tablet Take 325 mg by mouth daily. Patient not taking: Reported on 08/23/2022 02/21/21   [provider]  ipratropium-albuterol (DUONEB) 0.5-2.5 (3) MG/3ML SOLN Take 3 mLs by nebulization every 6 (six) hours as needed (shortness of breath). 08/30/22   Bonnielee Haff, MD  Multiple Vitamin (MULTIVITAMIN WITH MINERALS) TABS tablet Take 1 tablet by mouth daily.    [provider]  pantoprazole (PROTONIX) 40 MG tablet Take 1 tablet (40 mg total) by mouth 2 (two) times daily. 07/16/19   Monica Becton, MD  pravastatin (PRAVACHOL) 80 MG tablet Take 80 mg by mouth daily. 08/15/22   [provider]  TRESIBA FLEXTOUCH 200 UNIT/ML FlexTouch Pen Inject 10 Units into the skin daily. 11/29/20   [provider]      Allergies    Dulaglutide and Gabapentin    Review of Systems   Review of Systems  All other systems reviewed and are negative.   Physical Exam Updated Vital Signs BP (!) 136/51 (BP Location: Left Arm)    Pulse (!) 114   Temp 98.1 F (36.7 C) (Oral)   Resp (!) 24   SpO2 99%  Physical Exam Vitals and nursing note reviewed.  Constitutional:      General: She is not in acute distress.    Appearance: Normal appearance. She is well-developed. She is not toxic-appearing.  HENT:     Head: Normocephalic and atraumatic.  Eyes:     General: Lids are normal. Scleral icterus present.     Conjunctiva/sclera: Conjunctivae normal.     Pupils: Pupils are equal, round, and reactive to light.  Neck:     Thyroid: No thyroid mass.     Trachea: No tracheal deviation.  Cardiovascular:     Rate and Rhythm: Normal rate and regular rhythm.     Heart sounds: Normal heart sounds. No murmur heard.    No gallop.  Pulmonary:     Effort: Pulmonary effort is normal. No respiratory distress.     Breath sounds: Normal breath sounds. No stridor. No decreased breath sounds, wheezing, rhonchi or rales.  Abdominal:     General: There is no distension.     Palpations: Abdomen is soft.     Tenderness: There is no abdominal tenderness. There is no guarding or rebound.  Musculoskeletal:        General: No tenderness. Normal range of motion.     Cervical back: Normal range of motion and neck supple.  Skin:  General: Skin is warm and dry.     Coloration: Skin is jaundiced and pale.     Findings: No abrasion or rash.  Neurological:     General: No focal deficit present.     Mental Status: She is alert and oriented to person, place, and time. Mental status is at baseline.     GCS: GCS eye subscore is 4. GCS verbal subscore is 5. GCS motor subscore is 6.     Cranial Nerves: No cranial nerve deficit.     Sensory: No sensory deficit.     Motor: Motor function is intact.  Psychiatric:        Attention and Perception: Attention normal.        Speech: Speech normal.        Behavior: Behavior normal.     ED Results / Procedures / Treatments   Labs (all labs ordered are listed, but only abnormal results are  displayed) Labs Reviewed  COMPREHENSIVE METABOLIC PANEL  CBC WITH DIFFERENTIAL/PLATELET  LIPASE, BLOOD  URINALYSIS, ROUTINE W REFLEX MICROSCOPIC  LACTIC ACID, PLASMA  LACTIC ACID, PLASMA    EKG None  Radiology No results found.  Procedures Procedures    Medications Ordered in ED Medications  lactated ringers bolus 1,000 mL (has no administration in time range)    ED Course/ Medical Decision Making/ A&P                           Medical Decision Making Amount and/or Complexity of Data Reviewed Labs: ordered. Radiology: ordered.  Risk Prescription drug management.  Patient presented with anorexia and weakness.  Labs were significant for acute kidney injury as well as increased bilirubin and transaminases.  Abdominal CT performed per my review and interpretation shows bilateral DVTs and pulmonary embolus.  Subsequent Doppler ultrasounds confirmed the presence of DVTs.  Patient will be started on heparin.  Patient's abdominal CT also concerning for possible pancreatic cancer.  Patient's laboratory studies are consistent with this.  Sent secure chat to GI on-call.  Discussed with Dr. Olevia Bowens from [facility] hospitalist and patient will be admitted  CRITICAL CARE Performed by: Leota Jacobsen Total critical care time: 45 minutes Critical care time was exclusive of separately billable procedures and treating other patients. Critical care was necessary to treat or prevent imminent or life-threatening deterioration. Critical care was time spent personally by me on the following activities: development of treatment plan with patient and/or surrogate as well as nursing, discussions with consultants, evaluation of patient's response to treatment, examination of patient, obtaining history from patient or surrogate, ordering and performing treatments and interventions, ordering and review of laboratory studies, ordering and review of radiographic studies, pulse oximetry and re-evaluation of  patient's condition.         Final Clinical Impression(s) / ED Diagnoses Final diagnoses:  None    Rx / DC Orders ED Discharge Orders     None         Lacretia Leigh, MD 10/07/22 1243

## 2022-10-07 NOTE — ED Triage Notes (Addendum)
Patient BIB [facility] C/o abd pain, fever general weakness x 2 weeks RR with EMS 32-26 Patient placed on oxygen with EMS, does not use  supplemental oxygen at baseline Abd pain rated 6/10   C/o burning with urination Foul smelling urine

## 2022-10-07 NOTE — Consult Note (Addendum)
Consultation Note   Referring Provider: [facility] PCP: Mindi Curling, PA-C Primary Gastroenterologist: Carol Ada, MD Reason for consultation: pancreatic mass  Hospital Day: 1   HPI   Beth Garrison is a 81 y.o. female with multiple comorbidities, CKD, DM, hyperlipidemia, GERD, history of duodenal ulcers admitted with abdominal pain anorexia generalized weakness   Per patient and and her daughter her appetite has been significantly decreased for the past few weeks, she came home from rehab 2 weeks ago.  She has noticed light-colored stools and dark urine for the past 1 to 2 weeks.  Denies noticing yellowish discoloration of the skin," lives at home are not as bright".  She has mild abdominal discomfort, worse with eating.  Denies any blood in stool or melena. Denies any fever, chills, cough, nausea or vomiting. No history of frequent NSAIDs use.   Significant findings:   Alk phos 3364 Tbili 9.4 AST 265 / ALT 131 INR 4 Lipase 19 Cr 2.2 WBC 14.5  Hgb 9.4   CTAP -Peritoneal carcinomatosis with malignant ascites. The primary lesion is suspected to be an mass in the uncinate process of the pancreas which may reflect a neuroendocrine neoplasm. There is associated pancreatic ductal dilatation, diffuse pancreatic atrophy and significant dilation of the common bile duct. Gallbladder with marked distension with perhaps subtle gallbladder wall thickening  EGD 07/10/2019 - LA Grade D reflux esophagitis. - Non-bleeding gastric ulcers with no stigmata of bleeding. Biopsied. - Non-bleeding duodenal ulcer with no stigmata of bleeding.  Colonoscopy  05/20/2014 Ascending colon diminutive polyp, sigmoid diverticulosis   Recent Labs and Imaging CT Abdomen Pelvis W Contrast  Result Date: 10/07/2022 CLINICAL DATA:  Acute generalized abdominal pain EXAM: CT ABDOMEN AND PELVIS WITH CONTRAST TECHNIQUE: Multidetector CT imaging of the abdomen  and pelvis was performed using the standard protocol following bolus administration of intravenous contrast. RADIATION DOSE REDUCTION: This exam was performed according to the departmental dose-optimization program which includes automated exposure control, adjustment of the mA and/or kV according to patient size and/or use of iterative reconstruction technique. CONTRAST:  118m OMNIPAQUE IOHEXOL 300 MG/ML  SOLN COMPARISON:  None Available. FINDINGS: Lower chest: No acute abnormality. Hepatobiliary: Normal hepatic contour. Subtle 1.1 cm low-attenuation lesion in the anterior hepatic dome (image 9 series 2). Punctate calcifications are present in the posterior aspect of the hepatic dome. These are of uncertain etiology. Mild intrahepatic biliary ductal dilatation. Marked distension of the gallbladder with perhaps subtle gallbladder wall thickening. The common bile duct is also enlarged proximally at 1.5 cm. The bile duct and tapers to a normal diameter at the pancreatic head. Pancreas: Abnormal appearance of the pancreas. The pancreas is largely atrophied with diffuse dilation of the duct measuring up to 6 mm at the pancreatic head. Ill-defined enhancing nodule in the uncinate process measures approximately 2.0 x 1.4 cm. Spleen: Normal in size without focal abnormality. Adrenals/Urinary Tract: A nonspecific 1.7 cm left adrenal nodule measures 44 Hounsfield units. The right adrenal gland is normal. Mildly atrophic kidneys. Scattered low-attenuation lesions bilaterally are too small for accurate characterization but are statistically highly likely benign cysts. No follow-up imaging recommended. Slight renal size discrepancy with the left kidney smaller than the right. Ureters and bladder are unremarkable. Stomach/Bowel: Small  gastric fundal diverticulum. No evidence of bowel obstruction. Normal appendix. No focal mass. Vascular/Lymphatic: Extensive atherosclerotic vascular calcifications throughout the aorta. Suspect  significant left renal artery stenosis. No aneurysm or dissection. Suspect pulmonary emboli within partially imaged segmental right lower lobe PE. Additionally, filling defects are present within the left common femoral and bilateral femoral veins consistent with bilateral lower extremity DVT. Reproductive: Uterus and bilateral adnexa are unremarkable. Other: Diffusely abnormal peritoneum. Extensive micro nodularity throughout the mesentery with more subtle changes in the omentum. Nodular enhancement present along the peritoneal reflections in the pelvic cul-de-sac consistent with implants. There is associated moderate ascites which is almost certainly malignant in nature. No abdominal wall hernia. Musculoskeletal: Grade 1 anterolisthesis of L4 on L5. T9 vertebral body hemangioma. No acute fracture, malalignment or bony lesion. IMPRESSION: 1. Bilateral lower extremity DVT. Recommend dedicated bilateral lower extremity duplex venous ultrasound 2 confirm and assess extent. 2. Suspect right lower lobe segmental pulmonary emboli. CTA of the chest recommended for confirmation. 3. Peritoneal carcinomatosis with malignant ascites. The primary lesion is suspected to be an ill-defined enhancing mass in the uncinate process of the pancreas which may reflect a neuroendocrine neoplasm. There is associated pancreatic ductal dilatation, diffuse pancreatic atrophy and significant dilation of the common bile duct and the gallbladder. Recommend clinical correlation for signs and symptoms of acute cholecystitis given the degree of gallbladder distension. 4. Subtle 1.1 cm low-attenuation lesion in the anterior aspect of the hepatic dome concerning for hepatic metastatic disease. 5. Extensive atherosclerotic vascular disease with calcifications throughout the aorta and branch vessels. Probable hemodynamically significant stenosis of the left renal artery given relative atrophy of the left kidney. 6. Indeterminate left adrenal nodule  measuring up to 1.7 cm. This may represent an adenoma or metastatic implant. 7. Grade 1 anterolisthesis of L4 on L5 with associated multilevel degenerative disc disease. 8. Additional ancillary findings as above. Electronically Signed   By: Jacqulynn Cadet M.D.   On: 10/07/2022 10:44   DG Chest Port 1 View  Result Date: 10/07/2022 CLINICAL DATA:  Cough EXAM: PORTABLE CHEST 1 VIEW COMPARISON:  Prior chest x-ray 08/28/2022 FINDINGS: Nonspecific airspace opacity in the periphery of the left lung base. No pneumothorax or pleural effusion. Cardiac and mediastinal contours are unchanged. No acute osseous abnormality. 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 Aortic atherosclerotic vascular calcifications. IMPRESSION: 1. Nonspecific patchy airspace opacity in the periphery of the left lung base. Differential considerations include pneumonia versus atelectasis. Electronically Signed   By: Jacqulynn Cadet M.D.   On: 10/07/2022 10:25    Labs:  Recent Labs    10/07/22 0939  WBC 14.5*  HGB 9.5*  HCT 30.4*  PLT 233   Recent Labs    10/07/22 0939  NA 132*  K 4.0  CL 105  CO2 17*  GLUCOSE 116*  BUN 34*  CREATININE 2.20*  CALCIUM 7.9*   Recent Labs    10/07/22 0939  PROT 5.6*  ALBUMIN 2.3*  AST 265*  ALT 131*  ALKPHOS 3,364*  BILITOT 9.4*   No results for input(s): "HEPBSAG", "HCVAB", "HEPAIGM", "HEPBIGM" in the last 72 hours. Recent Labs    10/07/22 1345  LABPROT 38.3*  INR 4.0*    Past Medical History:  Diagnosis Date   AKI (acute kidney injury) ([place]) 07/07/2019   Diabetes mellitus without complication (HCC)    GERD (gastroesophageal reflux disease)    Hyperlipidemia    Hypertension     Past Surgical History:  Procedure Laterality Date   BIOPSY  07/10/2019   Procedure: BIOPSY;  Surgeon: Carol Ada, MD;  Location[facility] ENDOSCOPY;  Service: Endoscopy;;   ESOPHAGOGASTRODUODENOSCOPY (EGD) WITH PROPOFOL Left 07/10/2019   Procedure:  ESOPHAGOGASTRODUODENOSCOPY (EGD) WITH PROPOFOL;  Surgeon: Carol Ada, MD;  Location: [facility];  Service: Endoscopy;  Laterality: Left;   TONSILLECTOMY AND ADENOIDECTOMY      Family History  Problem Relation Age of Onset   Diabetes Mother    Hyperlipidemia Mother    Diabetes Father    Diabetes Brother    Diabetes Maternal Grandmother    Diabetes Paternal Grandmother    Cancer Neg Hx    Stroke Neg Hx     Prior to Admission medications   Medication Sig Start Date End Date Taking? Authorizing Provider  acetaminophen (TYLENOL) 500 MG tablet Take 500 mg by mouth every 6 (six) hours as needed for mild pain.   Yes [provider]  Multiple Vitamin (MULTIVITAMIN WITH MINERALS) TABS tablet Take 1 tablet by mouth daily.   Yes [provider]  pantoprazole (PROTONIX) 40 MG tablet Take 1 tablet (40 mg total) by mouth 2 (two) times daily. 07/16/19  Yes Vasireddy, Grier Mitts, MD  pravastatin (PRAVACHOL) 80 MG tablet Take 80 mg by mouth daily. 08/15/22  Yes [provider]  TRESIBA FLEXTOUCH 200 UNIT/ML FlexTouch Pen Inject 10 Units into the skin daily. 11/29/20  Yes [provider]  diclofenac Sodium (VOLTAREN) 1 % GEL Apply 2 g topically 4 (four) times daily. Patient not taking: Reported on 10/07/2022 08/10/22   Varney Biles, MD  ipratropium-albuterol (DUONEB) 0.5-2.5 (3) MG/3ML SOLN Take 3 mLs by nebulization every 6 (six) hours as needed (shortness of breath). Patient not taking: Reported on 10/07/2022 08/30/22   Bonnielee Haff, MD    Current Facility-Administered Medications  Medication Dose Route Frequency Provider Last Rate Last Admin   heparin ADULT infusion 100 units/mL (25000 units/215m)  1,050 Units/hr Intravenous Continuous ETawnya Crook RPH 10.5 mL/hr at 10/07/22 1351 1,050 Units/hr at 10/07/22 1351   lactated ringers infusion   Intravenous Continuous OReubin Milan MD       ondansetron ([facility] tablet 4 mg  4 mg Oral Q6H PRN OReubin Milan MD       Or   ondansetron ([facility] injection 4 mg  4 mg Intravenous Q6H PRN OReubin Milan MD       Current Outpatient Medications  Medication Sig Dispense Refill   acetaminophen (TYLENOL) 500 MG tablet Take 500 mg by mouth every 6 (six) hours as needed for mild pain.     Multiple Vitamin (MULTIVITAMIN WITH MINERALS) TABS tablet Take 1 tablet by mouth daily.     pantoprazole (PROTONIX) 40 MG tablet Take 1 tablet (40 mg total) by mouth 2 (two) times daily.     pravastatin (PRAVACHOL) 80 MG tablet Take 80 mg by mouth daily.     TRESIBA FLEXTOUCH 200 UNIT/ML FlexTouch Pen Inject 10 Units into the skin daily.     diclofenac Sodium (VOLTAREN) 1 % GEL Apply 2 g topically 4 (four) times daily. (Patient not taking: Reported on 10/07/2022) 50 g 0   ipratropium-albuterol (DUONEB) 0.5-2.5 (3) MG/3ML SOLN Take 3 mLs by nebulization every 6 (six) hours as needed (shortness of breath). (Patient not taking: Reported on 10/07/2022) 360 mL     Allergies as of 10/07/2022 - Review Complete 10/07/2022  Allergen Reaction Noted   Dulaglutide Diarrhea 07/23/2020   Gabapentin Diarrhea 03/24/2022    Social History   Socioeconomic History   Marital status:  Widowed    Spouse name: Not on file   Number of children: Not on file   Years of education: Not on file   Highest education level: Not on file  Occupational History   Not on file  Tobacco Use   Smoking status: Never   Smokeless tobacco: Never  Substance and Sexual Activity   Alcohol use: No   Drug use: No   Sexual activity: Yes    Birth control/protection: Post-menopausal  Other Topics Concern   Not on file  Social History Narrative   Not on file   Social Determinants of Health   Financial Resource Strain: Not on file  Food Insecurity: Not on file  Transportation Needs: Not on file  Physical Activity: Not on file  Stress: Not on file  Social Connections: Not on file  Intimate Partner Violence: Not on file    Review of  Systems: All systems reviewed and negative except where noted in HPI.  Physical Exam: Vital signs in last 24 hours: Temp:  [98.1 F (36.7 C)-98.6 F (37 C)] 98.6 F (37 C) (11/04 1358) Pulse Rate:  [95-114] 105 (11/04 1358) Resp:  [16-24] 24 (11/04 1358) BP: (109-137)/(48-90) 109/54 (11/04 1358) SpO2:  [96 %-100 %] 98 % (11/04 1358) Weight:  [77.1 kg] 77.1 kg (11/04 0951)    General:         Jaundiced and in no acute distress Eyes:               icteric. Lungs:            Clear  Heart:              S1S2 Abdomen:       soft, nodular firm in the periumbilical area with mild tenderness, otherwise no rebound tenderness Extremities:    no edema Skin                  no rash. Neuro:            A&O x 3.  Psych:            appropriate mood and  Affect.       Intake/Output from previous day: No intake/output data recorded. Intake/Output this shift:  No intake/output data recorded.    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    Biliary obstruction likely secondary to extrinsic compression vs tumor invasion No fever, mild leukocytosis, continue to monitor Start broad spectrum antibiotics Will need ERCP to decompress CBD and +/- EUS with FNA Will discuss with Dr Therisa Doyne (ERCP back up)regarding timing of the procedures, she informed me that she may be unable to do them this weekend as  she has multiple ERCP's scheduled already.   B/L DVT and PE on heparin gtt, continue, will need to  hold for 4 hours prior to the procedure INR 4, recheck in the AM  Full liquid diet as tolerated NPO after midnight  K. Denzil Magnuson , MD [phone]

## 2022-10-07 NOTE — Progress Notes (Signed)
Lower extremity venous has been completed.   Preliminary results in CV Proc.  Results given to MD.  Archie Patten 10/07/2022 12:41 PM

## 2022-10-07 NOTE — ED Notes (Signed)
Lab returned the call and stated her ALP had to be manually diluted and that is what the holdup is. No hemolyzation reported.

## 2022-10-08 DIAGNOSIS — Z7189 Other specified counseling: Secondary | ICD-10-CM

## 2022-10-08 DIAGNOSIS — E118 Type 2 diabetes mellitus with unspecified complications: Secondary | ICD-10-CM

## 2022-10-08 DIAGNOSIS — Z515 Encounter for palliative care: Secondary | ICD-10-CM

## 2022-10-08 DIAGNOSIS — E871 Hypo-osmolality and hyponatremia: Secondary | ICD-10-CM

## 2022-10-08 DIAGNOSIS — N179 Acute kidney failure, unspecified: Secondary | ICD-10-CM | POA: Diagnosis not present

## 2022-10-08 DIAGNOSIS — I10 Essential (primary) hypertension: Secondary | ICD-10-CM | POA: Diagnosis not present

## 2022-10-08 DIAGNOSIS — Z66 Do not resuscitate: Secondary | ICD-10-CM | POA: Diagnosis not present

## 2022-10-08 DIAGNOSIS — R109 Unspecified abdominal pain: Secondary | ICD-10-CM

## 2022-10-08 DIAGNOSIS — I82403 Acute embolism and thrombosis of unspecified deep veins of lower extremity, bilateral: Secondary | ICD-10-CM | POA: Diagnosis not present

## 2022-10-08 DIAGNOSIS — D649 Anemia, unspecified: Secondary | ICD-10-CM

## 2022-10-08 LAB — GLUCOSE, CAPILLARY
Glucose-Capillary: 117 mg/dL — ABNORMAL HIGH (ref 70–99)
Glucose-Capillary: 186 mg/dL — ABNORMAL HIGH (ref 70–99)
Glucose-Capillary: 246 mg/dL — ABNORMAL HIGH (ref 70–99)
Glucose-Capillary: 89 mg/dL (ref 70–99)
Glucose-Capillary: 94 mg/dL (ref 70–99)

## 2022-10-08 LAB — URINALYSIS, ROUTINE W REFLEX MICROSCOPIC
Bilirubin Urine: NEGATIVE
Glucose, UA: NEGATIVE mg/dL
Hgb urine dipstick: NEGATIVE
Ketones, ur: NEGATIVE mg/dL
Nitrite: NEGATIVE
Protein, ur: 100 mg/dL — AB
Specific Gravity, Urine: >1.046 — ABNORMAL HIGH (ref 1.005–1.030)
pH: 5 (ref 5.0–8.0)

## 2022-10-08 LAB — PROTIME-INR
INR: 7.2 (ref 0.8–1.2)
INR: 7.5 (ref 0.8–1.2)
Prothrombin Time: 61.3 seconds — ABNORMAL HIGH (ref 11.4–15.2)
Prothrombin Time: 63.1 seconds — ABNORMAL HIGH (ref 11.4–15.2)

## 2022-10-08 LAB — HEMOGLOBIN AND HEMATOCRIT, BLOOD
HCT: 22.5 % — ABNORMAL LOW (ref 36.0–46.0)
Hemoglobin: 7.1 g/dL — ABNORMAL LOW (ref 12.0–15.0)

## 2022-10-08 LAB — CBC
HCT: 24 % — ABNORMAL LOW (ref 36.0–46.0)
HCT: 25.4 % — ABNORMAL LOW (ref 36.0–46.0)
Hemoglobin: 7.6 g/dL — ABNORMAL LOW (ref 12.0–15.0)
Hemoglobin: 8 g/dL — ABNORMAL LOW (ref 12.0–15.0)
MCH: 29 pg (ref 26.0–34.0)
MCH: 29.1 pg (ref 26.0–34.0)
MCHC: 31.5 g/dL (ref 30.0–36.0)
MCHC: 31.7 g/dL (ref 30.0–36.0)
MCV: 91.6 fL (ref 80.0–100.0)
MCV: 92.4 fL (ref 80.0–100.0)
Platelets: 206 10*3/uL (ref 150–400)
Platelets: 224 K/uL (ref 150–400)
RBC: 2.62 MIL/uL — ABNORMAL LOW (ref 3.87–5.11)
RBC: 2.75 MIL/uL — ABNORMAL LOW (ref 3.87–5.11)
RDW: 19.5 % — ABNORMAL HIGH (ref 11.5–15.5)
RDW: 20 % — ABNORMAL HIGH (ref 11.5–15.5)
WBC: 13.7 K/uL — ABNORMAL HIGH (ref 4.0–10.5)
WBC: 14.9 10*3/uL — ABNORMAL HIGH (ref 4.0–10.5)
nRBC: 0 % (ref 0.0–0.2)
nRBC: 0 % (ref 0.0–0.2)

## 2022-10-08 LAB — HEPARIN LEVEL (UNFRACTIONATED)
Heparin Unfractionated: 0.58 IU/mL (ref 0.30–0.70)
Heparin Unfractionated: >1.1 [IU]/mL — ABNORMAL HIGH (ref 0.30–0.70)
Heparin Unfractionated: >1.1 [IU]/mL — ABNORMAL HIGH (ref 0.30–0.70)

## 2022-10-08 LAB — COMPREHENSIVE METABOLIC PANEL
ALT: 104 U/L — ABNORMAL HIGH (ref 0–44)
AST: 196 U/L — ABNORMAL HIGH (ref 15–41)
Albumin: 1.9 g/dL — ABNORMAL LOW (ref 3.5–5.0)
Alkaline Phosphatase: 3286 U/L — ABNORMAL HIGH (ref 38–126)
Anion gap: 9 (ref 5–15)
BUN: 31 mg/dL — ABNORMAL HIGH (ref 8–23)
CO2: 18 mmol/L — ABNORMAL LOW (ref 22–32)
Calcium: 7.7 mg/dL — ABNORMAL LOW (ref 8.9–10.3)
Chloride: 108 mmol/L (ref 98–111)
Creatinine, Ser: 2.04 mg/dL — ABNORMAL HIGH (ref 0.44–1.00)
GFR, Estimated: 24 mL/min — ABNORMAL LOW (ref >60)
Glucose, Bld: 128 mg/dL — ABNORMAL HIGH (ref 70–99)
Potassium: 4.6 mmol/L (ref 3.5–5.1)
Sodium: 135 mmol/L (ref 135–145)
Total Bilirubin: 8 mg/dL — ABNORMAL HIGH (ref 0.3–1.2)
Total Protein: 4.4 g/dL — ABNORMAL LOW (ref 6.5–8.1)

## 2022-10-08 LAB — DIC (DISSEMINATED INTRAVASCULAR COAGULATION)PANEL
D-Dimer, Quant: 2.84 ug/mL-FEU — ABNORMAL HIGH (ref 0.00–0.50)
Fibrinogen: 383 mg/dL (ref 210–475)
INR: 6.4 (ref 0.8–1.2)
Platelets: 215 10*3/uL (ref 150–400)
Prothrombin Time: 55.7 seconds — ABNORMAL HIGH (ref 11.4–15.2)
Smear Review: NONE SEEN
aPTT: >200 seconds (ref 24–36)

## 2022-10-08 LAB — FIBRINOGEN: Fibrinogen: 356 mg/dL (ref 210–475)

## 2022-10-08 LAB — APTT: aPTT: >200 s (ref 24–36)

## 2022-10-08 MED ORDER — ORAL CARE MOUTH RINSE: 15.0000 mL | OROMUCOSAL | Status: DC | PRN

## 2022-10-08 MED ORDER — HEPARIN (PORCINE) 25000 UT/250ML-% IV SOLN
900.0000 [IU]/h | INTRAVENOUS | Status: DC
  Administered 2022-10-08: 900 [IU]/h via INTRAVENOUS
  Filled 2022-10-08: qty 250

## 2022-10-08 MED ORDER — SODIUM CHLORIDE 0.9% IV SOLUTION: Freq: Once | INTRAVENOUS | Status: DC

## 2022-10-08 MED ORDER — HEPARIN (PORCINE) 25000 UT/250ML-% IV SOLN
600.0000 [IU]/h | INTRAVENOUS | Status: DC
  Administered 2022-10-08: 600 [IU]/h via INTRAVENOUS

## 2022-10-08 NOTE — Progress Notes (Signed)
[facility] ROUNDING NOTE   Subjective: No acute events overnight.  Patient feeling a little better today.  She denies any abdominal pain, nausea or vomiting.  She does not have much of an appetite.  No fevers or chills overnight.   Objective: Vital signs in last 24 hours: Temp:  [98.2 F (36.8 C)-98.6 F (37 C)] 98.2 F (36.8 C) (11/05 0656) Pulse Rate:  [95-111] 97 (11/05 0656) Resp:  [16-24] 16 (11/05 0656) BP: (105-137)/(47-60) 116/60 (11/05 0656) SpO2:  [96 %-100 %] 98 % (11/05 0656) Last BM Date : 10/06/22 General: NAD Lungs:  CTA b/l, no w/r/r Heart:  RRR, no m/r/g Abdomen:  Soft, NT, ND, +BS, negative Murphy's Ext: Bilateral trace pitting edema    Intake/Output from previous day: 11/04 0701 - 11/05 0700 In: 1182.5 [P.O.:240; I.V.:892.5; IV Piggyback:50] Out: 200 [Urine:200] Intake/Output this shift: No intake/output data recorded.   Lab Results: Recent Labs    10/07/22 0939 10/07/22 2331 10/08/22 0445  WBC 14.5* 14.9*  --   HGB 9.5* 7.6* 7.1*  PLT 233 206  --   MCV 92.1 91.6  --    BMET Recent Labs    10/07/22 0939 10/07/22 2331  NA 132* 135  K 4.0 4.6  CL 105 108  CO2 17* 18*  GLUCOSE 116* 128*  BUN 34* 31*  CREATININE 2.20* 2.04*  CALCIUM 7.9* 7.7*   LFT Recent Labs    10/07/22 0939 10/07/22 2331  PROT 5.6* 4.4*  ALBUMIN 2.3* 1.9*  AST 265* 196*  ALT 131* 104*  ALKPHOS 3,364* 3,286*  BILITOT 9.4* 8.0*   PT/INR Recent Labs    10/07/22 1345 10/07/22 2331  INR 4.0* 7.2*      Imaging/Other results: VAS [place] LOWER EXTREMITY VENOUS (DVT) (7a-7p)  Result Date: 10/07/2022  Lower Venous DVT Study Patient Name:  ILLIANNA PASCHAL  Date of Exam:   10/07/2022 Medical Rec #: [id number]     Accession #:    [id number] Date of Birth: 1941/07/02     Patient Gender: F Patient Age:   34 years Exam Location:  [facility] Procedure:      VAS [place] LOWER EXTREMITY VENOUS (DVT) Referring Phys: Lacretia Leigh  --------------------------------------------------------------------------------  Indications: Edema.  Comparison Study: no prior Performing Technologist: Archie Patten RVS  Examination Guidelines: A complete evaluation includes B-mode imaging, spectral Doppler, color Doppler, and power Doppler as needed of all accessible portions of each vessel. Bilateral testing is considered an integral part of a complete examination. Limited examinations for reoccurring indications may be performed as noted. The reflux portion of the exam is performed with the patient in reverse Trendelenburg.  +---------+---------------+---------+-----------+----------+-----------------+ RIGHT    CompressibilityPhasicitySpontaneityPropertiesThrombus Aging    +---------+---------------+---------+-----------+----------+-----------------+ CFV      Full           Yes      Yes                                    +---------+---------------+---------+-----------+----------+-----------------+ SFJ      Full                                                           +---------+---------------+---------+-----------+----------+-----------------+ FV Prox  None  Age Indeterminate +---------+---------------+---------+-----------+----------+-----------------+ FV Mid   None                                         Age Indeterminate +---------+---------------+---------+-----------+----------+-----------------+ FV DistalNone                                         Age Indeterminate +---------+---------------+---------+-----------+----------+-----------------+ PFV      None                                         Age Indeterminate +---------+---------------+---------+-----------+----------+-----------------+ POP      None           No       No                   Age Indeterminate +---------+---------------+---------+-----------+----------+-----------------+ PTV      None                                          Age Indeterminate +---------+---------------+---------+-----------+----------+-----------------+ PERO     None                                         Age Indeterminate +---------+---------------+---------+-----------+----------+-----------------+   +---------+---------------+---------+-----------+----------+-----------------+ LEFT     CompressibilityPhasicitySpontaneityPropertiesThrombus Aging    +---------+---------------+---------+-----------+----------+-----------------+ CFV      None           No       No                   Age Indeterminate +---------+---------------+---------+-----------+----------+-----------------+ SFJ      None                                         Age Indeterminate +---------+---------------+---------+-----------+----------+-----------------+ FV Prox  None                                         Age Indeterminate +---------+---------------+---------+-----------+----------+-----------------+ FV Mid   None                                         Age Indeterminate +---------+---------------+---------+-----------+----------+-----------------+ FV DistalNone                                         Age Indeterminate +---------+---------------+---------+-----------+----------+-----------------+ PFV      None                                         Age Indeterminate +---------+---------------+---------+-----------+----------+-----------------+ POP      None  No       No                   Age Indeterminate +---------+---------------+---------+-----------+----------+-----------------+ PTV      None                                         Age Indeterminate +---------+---------------+---------+-----------+----------+-----------------+ PERO     None                                         Age Indeterminate  +---------+---------------+---------+-----------+----------+-----------------+ EIV                     No       No                   Age Indeterminate +---------+---------------+---------+-----------+----------+-----------------+     Summary: RIGHT: - Findings consistent with age indeterminate deep vein thrombosis involving the right femoral vein, right proximal profunda vein, right popliteal vein, right posterior tibial veins, and right peroneal veins. - No cystic structure found in the popliteal fossa.  LEFT: - Findings consistent with age indeterminate deep vein thrombosis involving the left common femoral vein, SF junction, left femoral vein, left proximal profunda vein, left popliteal vein, left posterior tibial veins, left peroneal veins, and EIV. - No cystic structure found in the popliteal fossa.  *See table(s) above for measurements and observations. Electronically signed by Orlie Pollen on 10/07/2022 at 1:39:36 PM.    Final    CT Abdomen Pelvis W Contrast  Result Date: 10/07/2022 CLINICAL DATA:  Acute generalized abdominal pain EXAM: CT ABDOMEN AND PELVIS WITH CONTRAST TECHNIQUE: Multidetector CT imaging of the abdomen and pelvis was performed using the standard protocol following bolus administration of intravenous contrast. RADIATION DOSE REDUCTION: This exam was performed according to the departmental dose-optimization program which includes automated exposure control, adjustment of the mA and/or kV according to patient size and/or use of iterative reconstruction technique. CONTRAST:  117m OMNIPAQUE IOHEXOL 300 MG/ML  SOLN COMPARISON:  None Available. FINDINGS: Lower chest: No acute abnormality. Hepatobiliary: Normal hepatic contour. Subtle 1.1 cm low-attenuation lesion in the anterior hepatic dome (image 9 series 2). Punctate calcifications are present in the posterior aspect of the hepatic dome. These are of uncertain etiology. Mild intrahepatic biliary ductal dilatation. Marked distension  of the gallbladder with perhaps subtle gallbladder wall thickening. The common bile duct is also enlarged proximally at 1.5 cm. The bile duct and tapers to a normal diameter at the pancreatic head. Pancreas: Abnormal appearance of the pancreas. The pancreas is largely atrophied with diffuse dilation of the duct measuring up to 6 mm at the pancreatic head. Ill-defined enhancing nodule in the uncinate process measures approximately 2.0 x 1.4 cm. Spleen: Normal in size without focal abnormality. Adrenals/Urinary Tract: A nonspecific 1.7 cm left adrenal nodule measures 44 Hounsfield units. The right adrenal gland is normal. Mildly atrophic kidneys. Scattered low-attenuation lesions bilaterally are too small for accurate characterization but are statistically highly likely benign cysts. No follow-up imaging recommended. Slight renal size discrepancy with the left kidney smaller than the right. Ureters and bladder are unremarkable. Stomach/Bowel: Small gastric fundal diverticulum. No evidence of bowel obstruction. Normal appendix. No focal mass. Vascular/Lymphatic: Extensive atherosclerotic vascular calcifications throughout the aorta. Suspect significant left  renal artery stenosis. No aneurysm or dissection. Suspect pulmonary emboli within partially imaged segmental right lower lobe PE. Additionally, filling defects are present within the left common femoral and bilateral femoral veins consistent with bilateral lower extremity DVT. Reproductive: Uterus and bilateral adnexa are unremarkable. Other: Diffusely abnormal peritoneum. Extensive micro nodularity throughout the mesentery with more subtle changes in the omentum. Nodular enhancement present along the peritoneal reflections in the pelvic cul-de-sac consistent with implants. There is associated moderate ascites which is almost certainly malignant in nature. No abdominal wall hernia. Musculoskeletal: Grade 1 anterolisthesis of L4 on L5. T9 vertebral body hemangioma.  No acute fracture, malalignment or bony lesion. IMPRESSION: 1. Bilateral lower extremity DVT. Recommend dedicated bilateral lower extremity duplex venous ultrasound 2 confirm and assess extent. 2. Suspect right lower lobe segmental pulmonary emboli. CTA of the chest recommended for confirmation. 3. Peritoneal carcinomatosis with malignant ascites. The primary lesion is suspected to be an ill-defined enhancing mass in the uncinate process of the pancreas which may reflect a neuroendocrine neoplasm. There is associated pancreatic ductal dilatation, diffuse pancreatic atrophy and significant dilation of the common bile duct and the gallbladder. Recommend clinical correlation for signs and symptoms of acute cholecystitis given the degree of gallbladder distension. 4. Subtle 1.1 cm low-attenuation lesion in the anterior aspect of the hepatic dome concerning for hepatic metastatic disease. 5. Extensive atherosclerotic vascular disease with calcifications throughout the aorta and branch vessels. Probable hemodynamically significant stenosis of the left renal artery given relative atrophy of the left kidney. 6. Indeterminate left adrenal nodule measuring up to 1.7 cm. This may represent an adenoma or metastatic implant. 7. Grade 1 anterolisthesis of L4 on L5 with associated multilevel degenerative disc disease. 8. Additional ancillary findings as above. Electronically Signed   By: Jacqulynn Cadet M.D.   On: 10/07/2022 10:44   DG Chest Port 1 View  Result Date: 10/07/2022 CLINICAL DATA:  Cough EXAM: PORTABLE CHEST 1 VIEW COMPARISON:  Prior chest x-ray 08/28/2022 FINDINGS: Nonspecific airspace opacity in the periphery of the left lung base. No pneumothorax or pleural effusion. Cardiac and mediastinal contours are unchanged. No acute osseous abnormality. 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 Aortic atherosclerotic vascular calcifications. IMPRESSION: 1. Nonspecific patchy airspace opacity in  the periphery of the left lung base. Differential considerations include pneumonia versus atelectasis. Electronically Signed   By: Jacqulynn Cadet M.D.   On: 10/07/2022 10:25      Assessment and Plan:  81 year old female admitted with several weeks of decreasing appetite and painless jaundice, found to have pancreatic mass, dilated bile duct, markedly distended gallbladder and ascites, most likely peritoneal carcinomatosis.  Also found to have extensive bilateral DVTs and suspected pulmonary embolism.  She was coagulopathic on admission with an INR of 4, possibly secondary to DIC. Although there was some degree of suspicion for cholangitis given her mild leukocytosis and coagulopathy, I think this is less likely.  She has remained afebrile, denies abdominal pain, nausea, vomiting, subjective chills.  She has been hemodynamically stable with a normal lactate.  Her coagulopathy is most likely malignancy her extensive DVTs/PE and presumed metastatic cancer. She was started on a heparin drip, and had a INR of 7 this morning.  She needs an ERCP to decompress her bile duct and possibly diagnostic EUS with FNA to confirm suspicion for cancer.  It may also be possible to obtain a diagnosis through cytology from her ascites. The patient belongs to Dr. Adriana Mccallum, who will be taking over her care tomorrow.  I will  discuss her case with them this evening to determine if they would like to proceed with an ERCP tomorrow, versus waiting until her coagulopathy is more controlled.  Given no plans for an invasive procedure today, okay for patient to eat, will make n.p.o. after midnight for possible procedure tomorrow.  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 Adriana Mccallum further to formulate plans for management of coagulopathy in the perioperative setting    Daryel November, MD  10/08/2022, 10:18 AM [facility]

## 2022-10-08 NOTE — Progress Notes (Signed)
   10/08/22 3524  Provider Notification  Provider Name/Title Raenette Rover NP  Date Provider Notified 10/08/22  Time Provider Notified [phone]  Method of Notification  (secure chat)  Notification Reason Critical result  Test performed and critical result INR 7.2  Date Critical Result Received 10/08/22  Time Critical Result Received 0412  Provider response No new orders  Date of Provider Response 10/08/22  Time of Provider Response [phone]

## 2022-10-08 NOTE — Consult Note (Signed)
[facility] Nurse Consult Note: Reason for Consult:Bilateral Unstageable pressure injuries to heels Wound type:pressure  Pressure Injury POA: Yes Measurement: Left heel: 3cnm x 2.5cm nonviable tissue, dry Right heel: 2cm round, scant drainage Wound bed:AS noted above Drainage (amount, consistency, odor) As noted above Periwound:intact, mild maceration to left heel periwound area Dressing procedure/placement/frequency: I have provided pressure redistribution heel boots (Prevalon) and twice daily topical care guidance for Nursing using a soap and water cleanse, rinse and dry followed by placement of betadine (povidone-iodine) moistened gauze to te wounds topped with dry gauze and secured with a few turns of Kerlix roll gauze/paper tape. Feet are to be placed into Prevalon pressure redistribution heel boots.  Recommend consultation with Podiatric Medicine (Podiatry) or Orthopedic Surgery while in house for their evaluation and input to the [facility] as they may also elect to follow post discharge. If you agree, please order/arrange consult.  [facility] nursing team will not follow, but will remain available to this patient, the nursing and medical teams.  Please re-consult if needed.  Thank you for inviting [place] to participate in this patient's Plan of Care.  Maudie Flakes, MSN, RN, CNS, [place], Serita Grammes, [employer], [employer] phone:  [phone]

## 2022-10-08 NOTE — Progress Notes (Signed)
ANTICOAGULATION CONSULT NOTE - Follow Up Consult  Pharmacy Consult for Heparin Indication: VTE treatment  Allergies  Allergen Reactions   Dulaglutide Diarrhea   Gabapentin Diarrhea    Patient Measurements: Height: _0  (165.1 cm) Weight: 77.1 kg (170 lb) IBW/kg (Calculated) : 57 Heparin Dosing Weight: 73kg  Vital Signs: Temp: 97.8 F (36.6 C) (11/05 2042) Temp Source: Oral (11/05 2042) BP: 131/48 (11/05 2042) Pulse Rate: 103 (11/05 2042)  Labs: Recent Labs     0000 10/07/22 0939 10/07/22 1345 10/07/22 1414 10/07/22 2331 10/08/22 0445 10/08/22 0957 10/08/22 1845 10/08/22 2034  HGB  --  9.5*  --   --  7.6* 7.1* 8.0*  --   --   HCT  --  30.4*  --   --  24.0* 22.5* 25.4*  --   --   PLT  --  233  --   --  206  --  224 215  --   APTT  --   --  50*  --   --   --  >200* >200*  --   LABPROT   < >  --  38.3*  --  61.3*  --  63.1* 55.7*  --   INR   < >  --  4.0*  --  7.2*  --  7.5* 6.4*  --   HEPARINUNFRC  --   --   --    < > >1.10*  --  >1.10*  --  0.58  CREATININE  --  2.20*  --   --  2.04*  --   --   --   --    < > = values in this interval not displayed.     Estimated Creatinine Clearance: 22.6 mL/min (A) (by C-G formula based on SCr of 2.04 mg/dL (H)).   Medications:  Infusions:   heparin 600 Units/hr (10/08/22 1236)   lactated ringers 100 mL/hr at 10/08/22 0521   piperacillin-tazobactam (ZOSYN)  IV 3.375 g (10/08/22 1311)    Assessment: 57 yoF presented to ED on 11/4 with abdominal pain, weakness. CT showed bilateral LE DVT and suspected PE.  Pharmacy consulted for heparin management. Also noted to have pancreatic mass and biliary obstruction leading to acute hepatic injury  Baseline INR elevated (4.0 > 7.2) No anticoagulation noted PTA, baseline CBC consistent with baseline.   Today, 10/08/2022: Heparin level now therapeutic after multiple rate decreases CBC: Hgb improved to 8.0, Plt WNL No bleeding or infusion issues reported; no further IV site  bleeding SCr decreased to 2.04 Alk Phos remains elevated 3,286, AST/ALT 196/104, Tbili 8 Baseline INR remains elevated but slightly decreased   Goal of Therapy:  Heparin level 0.3-0.7 units/ml Monitor platelets by anticoagulation protocol: Yes   Plan:  Continue heparin at 600 units/hr Recheck confirmatory heparin level with AM labs Daily heparin level and CBC Follow up timing for ERCP (GI note states need to hold heparin gtt 4 hours prior)    Reuel Boom, PharmD, BCPS [phone] 10/08/2022, 9:05 PM

## 2022-10-08 NOTE — Consult Note (Signed)
Palliative Care Consult Note                                  Date: 10/08/2022   Patient Name: Beth Garrison  DOB: [birth year]  MRN: [id number]  Age / Sex: 81 y.o., female  PCP: Juanda Chance Referring Physician: Hosie Poisson, MD  Reason for Consultation: Establishing goals of care  HPI/Patient Profile: Palliative Care consult requested for goals of care discussion in this 81 y.o. female  with past medical history of diabetes, GERD, hyperlipidemia, stage IIIa CKD, hypertension, anemia, and sepsis. She was admitted on 10/07/2022 from home with weight loss, decreased appetite, nausea, and abdominal pain. During work-up CT abd/pelvis showed bilateral lower extremity DVT, pulmonary emboli, peritoneal carcinomatosis, malignant ascites, and pancreatic mass. She is being followed by GI with plans for ERCP and diagnostic paracentesis.   Past Medical History:  Diagnosis Date   AKI (acute kidney injury) ([place]) 07/07/2019   Diabetes mellitus without complication (HCC)    GERD (gastroesophageal reflux disease)    Hyperlipidemia    Hypertension      Subjective:   This NP Osborne Oman reviewed medical records, received report from team, assessed the patient and then met at the patient's bedside with Beth Garrison and her daughter, Beth Garrison to discuss diagnosis, prognosis, [place], EOL wishes disposition and options.  Ms. Cove is awake and alert. No acute distress. Able to engage in discussions appropriately.   Concept of Palliative Care was introduced as specialized medical care for people and their families living with serious illness.  It focuses on providing relief from the symptoms and stress of a serious illness.  The goal is to improve quality of life for both the patient and the family. Values and goals of care important to patient and family were attempted to be elicited.  I created space and opportunity for patient and family to explore state  of health prior to admission, thoughts, and feelings.   [name] lives in the home with her daughter.  Single.  2 children.  She is originally from [place].  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  She is ambulatory in the home with a walker.  No recent falls.  Is independent of most ADLs however does require occasional assistance due to increased fatigue and weakness.  Patient and daughter endorses significant decrease in her appetite with an approximate weight loss of 15 or more pounds over the past 3-4 months.  She states when she eats this sometimes causes abdominal pain.  Patient is jaundice.  Both she and her daughter denies noticing this prior to admission.  Daughter states they do not have the best lighting inside their home.  We discussed Her current illness and what it means in the larger context of Her on-going co-morbidities. Natural disease trajectory and expectations were discussed.  Beth Garrison and her daughter verbalized understanding of current illness and co-morbidities.  They are realistic in their understanding of her highly suspicious pancreatic cancer.  Ms. Seevers states she knew that she was not feeling well but never in a lifetime expected to be told she has cancer.  Emotional support provided.  Patient daughter are clear and expressed wishes to continue to treat the treatable aggressively allow her every opportunity to continue to thrive.  When she has a definitive diagnosis and clear understanding of available treatment options they would then make the best decision based on her quality of life.  She states  she is open to chemotherapy and radiation if recommended.  I discussed the importance of continued conversation with family and their medical providers regarding overall plan of care and treatment options, ensuring decisions are within the context of the patients values and GOCs.  Questions and concerns were addressed. Patient and daughter was encouraged to call with questions or concerns.   PMT will continue to support holistically as needed.   Objective:   Primary Diagnoses: Present on Admission:  Hyperbilirubinemia  AKI (acute kidney injury) ([place])  Hyponatremia  Hyperlipidemia with target LDL less than 100  HTN (hypertension)  Stage 3b chronic kidney disease (CKD) ([place])  Type II diabetes mellitus with manifestations (HCC)  Normocytic anemia  Pancreatic mass  DVT, bilateral lower limbs (HCC)   Scheduled Meds:   Continuous Infusions:  heparin     lactated ringers 100 mL/hr at 10/08/22 0521   piperacillin-tazobactam (ZOSYN)  IV 3.375 g (10/08/22 0520)    PRN Meds: HYDROmorphone (DILAUDID) injection, ondansetron **OR** ondansetron (ZOFRAN) IV, mouth rinse  Allergies  Allergen Reactions   Dulaglutide Diarrhea   Gabapentin Diarrhea    Review of Systems  Constitutional:  Positive for activity change, appetite change and fatigue.  Gastrointestinal:  Positive for abdominal distention and abdominal pain.  Musculoskeletal:  Positive for back pain.  Neurological:  Positive for weakness.  Unless otherwise noted, a complete review of systems is negative.  Physical Exam General: NAD Cardiovascular: regular rate and rhythm Pulmonary: Normal breathing pattern, diminished bilaterally  Abdomen: soft, tender, distended, + bowel sounds Extremities: Lower extremity tenderness Skin:  bilateral heel wounds (not observed) Neurological: AAOx3, mood appropriate  Vital Signs:  BP 116/60 (BP Location: Left Leg)   Pulse 97   Temp 98.2 F (36.8 C) (Oral)   Resp 16   Ht _0  (1.651 m)   Wt 77.1 kg   SpO2 98%   BMI 28.29 kg/m  Pain Scale: 0-10   Pain Score: 0-No pain  SpO2: SpO2: 98 % O2 Device:SpO2: 98 % O2 Flow Rate: .   IO: Intake/output summary:  Intake/Output Summary (Last 24 hours) at 10/08/2022 1207 Last data filed at 10/08/2022 0200 Gross per 24 hour  Intake 1182.46 ml  Output 200 ml  Net 982.46 ml    LBM: Last BM Date : 10/06/22 Baseline  Weight: Weight: 77.1 kg Most recent weight: Weight: 77.1 kg      Palliative Assessment/Data:    Advanced Care Planning:   Primary Decision Maker: PATIENT  Code Status/Advance Care Planning: DNR  A discussion was had today regarding advanced directives. Concepts specific to code status, artifical feeding and hydration, continued IV antibiotics and rehospitalization was had.  The difference between a aggressive medical intervention path and a palliative comfort care path was discussed.   Patient does not have a documented advanced directive.  Confirms that her daughter Beth Garrison would be her medical decision-maker.  Education provided on in the event of an emergency both children would be considered equally responsible for decision making.  Patient verbalized understanding expressing wishes to complete directives while hospitalized.  Advanced directive packet provided along with education on each section.  I empathetically approach discussions around her full CODE STATUS with consideration of her current illness and comorbidities.  Recommendations for DNR/DNI.  Patient verbalized understanding expressing she would not want heroic life-sustaining measures with the desire of a natural death.  Daughter verbalized understanding and support for her mother's expressed wishes.  Education provided on DNR/DNI.  Patient and daughter both verbalized understanding again  confirming wishes for DNR/DNI.  Assessment & Plan:   SUMMARY OF RECOMMENDATIONS   DNR/DNI-as requested and confirmed by patient/daughter.  Out of facility form completed and placed on chart. Extensive goals of care discussions.  Continue with current plan of care.  Patient and daughter are clear and expressed wishes to continue to treat the treatable aggressively allow her every opportunity to continue to thrive.  She is realistic in her understanding of probable pancreatic cancer.  Expresses she will be open to chemoradiation if  recommended. Spiritual care referral for assistance with advanced directive completion. PMT will continue to support and follow. Please secure chat with urgent needs.  Symptom Management:  Per Attending  Palliative Prophylaxis:  Bowel Regimen, Frequent Pain Assessment, Palliative Wound Care, and Turn Reposition  Additional Recommendations (Limitations, Scope, Preferences): Full Scope Treatment and DNR/DNI  Psycho-social/Spiritual:  Desire for further Chaplaincy support: yes Additional Recommendations:  Ongoing goals of care discussions  Prognosis:  Guarded  Discharge Planning:  To Be Determined   Discussed with: RN  Patient and daughter expressed understanding and was in agreement with this plan.   Time Total: 75 min  Visit consisted of counseling and education dealing with the complex and emotionally intense issues of symptom management and palliative care in the setting of serious and potentially life-threatening illness.Greater than 50%  of this time was spent counseling and coordinating care related to the above assessment and plan.  Signed by:  Alda Lea, AGPCNP-BC Palliative Medicine Team  Phone: [phone] Pager: [phone] Amion: Bjorn Pippin   Thank you for allowing the Palliative Medicine Team to assist in the care of this patient. Please utilize secure chat with additional questions, if there is no response within 30 minutes please call the above phone number. Palliative Medicine Team providers are available by phone from 7am to 5pm daily and can be reached through the team cell phone.  Should this patient require assistance outside of these hours, please call the patient's attending physician.

## 2022-10-08 NOTE — Progress Notes (Signed)
[facility]                                                                               Beth Garrison, is a 81 y.o. female, DOB - 12-21-1940, ONG:[id number] Admit date - 10/07/2022    Outpatient Primary MD for the patient is Beth Curling, PA-C  LOS - 1  days    Brief summary    Beth Garrison is a 81 y.o. female with medical history significant of  AKI, stage IIIa CKD, hyperkalemia, history of melena, history of sepsis, type II DM, type 2 diabetes, GERD, hyperlipidemia, hypertension, intractable nausea and vomiting, unspecified anemia who is brought via EMS due to abdominal pain, decreased appetite, nausea, weight loss and generalized weakness for the past 2 weeks.     CT abdomen/pelvis with bilateral lower extremity DVT and suspicion for right lower lobe segmental pulmonary emboli.  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  Ill-defined mass in the uncinate process of the pancreas.  Associated pancreatic ductal dilatation, diffuse pancreatic atrophy and significant dilatation of the CBD and gallbladder.   GI and IR Consulted for recommendations.    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  - GI on board and plan for ERCP at the earliest  - diagnostic paracentesis with cytology.  - pain control.  - NPO after midnight.     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  - INR repeatedly high 7.5, unable to do paracentesis.  - ordered FFP, discussed with Oncologist Dr Alen Blew recommended at least 5 units of FFP and repeat DIC panel in the morning.     Bilateral DVT and suspected PE:  On Heparin gtt , supra therapeutic INR;    Acute on STAGE 3 a CKD:  Creatinine at baseline between 1. 4 to 1.6.  Gently hydrate and repeat renal parameters in am.    Hypertension:  Well controlled BP parameters.    Type 2 DM:  CBG (last 3)  Recent Labs    10/08/22 0636 10/08/22 1155 10/08/22 1651  GLUCAP 94 89 186*   Well controlled CBG'S .    Hyperlipidemia:  On statin     Anemia of chronic disease:  Transfuse to keep hemoglobin greater than 7.    Leukocytosis:  Suspect reactive.  Empirically  on IV antibiotics.     RN Pressure Injury Documentation: Pressure Injury 03/27/21 Coccyx Medial Stage 1 -  Intact skin with non-blanchable redness of a localized area usually over a bony prominence. reddened (Active)  03/27/21 0045  Location: Coccyx  Location Orientation: Medial  Staging: Stage 1 -  Intact skin with non-blanchable redness of a localized area usually over a bony prominence.  Wound Description (Comments): reddened  Present on Admission: Yes     Pressure Injury 03/27/21 Buttocks Right Deep Tissue Pressure Injury - Purple or maroon localized area of discolored intact skin or blood-filled blister due to damage of underlying soft tissue from pressure and/or shear. purplish area to right buttocks (Active)  03/27/21 0045  Location: Buttocks  Location Orientation: Right  Staging: Deep Tissue Pressure Injury -  Purple or maroon localized area of discolored intact skin or blood-filled blister due to damage of underlying soft tissue from pressure and/or shear.  Wound Description (Comments): purplish area to right buttocks  Present on Admission: Yes     Pressure Injury 08/26/22 Buttocks Lower;Medial Stage 1 -  Intact skin with non-blanchable redness of a localized area usually over a bony prominence. (Active)  08/26/22 1549  Location: Buttocks  Location Orientation: Lower;Medial  Staging: Stage 1 -  Intact skin with non-blanchable redness of a localized area usually over a bony prominence.  Wound Description (Comments):   Present on Admission: Yes     Pressure Injury 10/07/22 Heel Left Unstageable - Full thickness tissue loss in which the base of the injury is covered by slough  (yellow, tan, gray, green or brown) and/or eschar (tan, brown or black) in the wound bed. 3 cm x 2 1/2 cm (Active)  10/07/22 1545  Location: Heel  Location Orientation: Left  Staging: Unstageable - Full thickness tissue loss in which the base of the injury is covered by slough (yellow, tan, gray, green or brown) and/or eschar (tan, brown or black) in the wound bed.  Wound Description (Comments): 3 cm x 2 1/2 cm  Present on Admission: Yes  Dressing Type Foam - Lift dressing to assess site every shift 10/08/22 1120     Pressure Injury 10/07/22 Heel Right Unstageable - Full thickness tissue loss in which the base of the injury is covered by slough (yellow, tan, gray, green or brown) and/or eschar (tan, brown or black) in the wound bed. 2 cm x 2 cm (Active)  10/07/22 1545  Location: Heel  Location Orientation: Right  Staging: Unstageable - Full thickness tissue loss in which the base of the injury is covered by slough (yellow, tan, gray, green or brown) and/or eschar (tan, brown or black) in the wound bed.  Wound Description (Comments): 2 cm x 2 cm  Present on Admission: Yes  Dressing Type Foam - Lift dressing to assess site every shift 10/08/22 1120     Estimated body mass index is 28.29 kg/m as calculated from the following:   Height as of this encounter: '5\' 5"'$  (1.651 m).   Weight as of this encounter: 77.1 kg.  Code Status: DNR DVT Prophylaxis:  Heparin.    Level of Care: Level of care: Progressive Family Communication: none at bedside.   Disposition Plan:     Remains inpatient appropriate:  further work up for pancreatic mass.   Procedures:  US paracentesis.   Consultants:   GI IR.   Antimicrobials:   Anti-infectives (From admission, onward)    Start     Dose/Rate Route Frequency Ordered Stop   10/07/22 2000  piperacillin-tazobactam (ZOSYN) IVPB 3.375 g        3.375 g 12.5 mL/hr over 240 Minutes Intravenous Every 8 hours 10/07/22 1856           Medications  Scheduled Meds: Continuous Infusions:  heparin 600 Units/hr (10/08/22 1236)   lactated ringers 100 mL/hr at 10/08/22 0521   piperacillin-tazobactam (ZOSYN)  IV 3.375 g (10/08/22 1311)   PRN Meds:.HYDROmorphone (DILAUDID) injection, ondansetron **OR** ondansetron (ZOFRAN) IV, mouth rinse    Subjective:   Montine Hight was seen and examined today.  Wants to eat  .  Objective:   Vitals:   10/07/22 2342 10/08/22 0147 10/08/22 0656 10/08/22 1411  BP: (!) 114/55 (!) 114/52 116/60 115/60  Pulse: (!) 103 (!) 102 97 (!) 103  Resp: 16  16 18  Temp: 98.3 F (36.8 C)  98.2 F (36.8 C) 98.9 F (37.2 C)  TempSrc: Oral  Oral Oral  SpO2: 98% 98% 98% 98%  Weight:      Height:        Intake/Output Summary (Last 24 hours) at 10/08/2022 1722 Last data filed at 10/08/2022 1509 Gross per 24 hour  Intake 1674.59 ml  Output 500 ml  Net 1174.59 ml   Filed Weights   10/07/22 0951  Weight: 77.1 kg     Exam General exam: Appears calm and comfortable  Respiratory system: Clear to auscultation. Respiratory effort normal. Cardiovascular system: S1 & S2 heard, RRR. No JVD,. No pedal edema. Gastrointestinal system: Abdomen is distended, soft, Normal bowel sounds heard. Central nervous system: Alert and oriented. No focal neurological deficits. Extremities: Symmetric 5 x 5 power. Skin: No rashes, lesions or ulcers Psychiatry: Judgement and insight appear normal  Data Reviewed:  I have personally reviewed following labs and imaging studies   CBC Lab Results  Component Value Date   WBC 13.7 (H) 10/08/2022   RBC 2.75 (L) 10/08/2022   HGB 8.0 (L) 10/08/2022   HCT 25.4 (L) 10/08/2022   MCV 92.4 10/08/2022   MCH 29.1 10/08/2022   PLT 224 10/08/2022   MCHC 31.5 10/08/2022   RDW 20.0 (H) 10/08/2022   LYMPHSABS 1.8 10/07/2022   MONOABS 0.6 10/07/2022   EOSABS 0.1 10/07/2022   BASOSABS 0.1 67/61/9509     Last metabolic panel Lab Results  Component Value Date    NA 135 10/07/2022   K 4.6 10/07/2022   CL 108 10/07/2022   CO2 18 (L) 10/07/2022   BUN 31 (H) 10/07/2022   CREATININE 2.04 (H) 10/07/2022   GLUCOSE 128 (H) 10/07/2022   GFRNONAA 24 (L) 10/07/2022   GFRAA 36 (L) 07/15/2019   CALCIUM 7.7 (L) 10/07/2022   PHOS 2.8 03/27/2021   PROT 4.4 (L) 10/07/2022   ALBUMIN 1.9 (L) 10/07/2022   BILITOT 8.0 (H) 10/07/2022   ALKPHOS 3,286 (H) 10/07/2022   AST 196 (H) 10/07/2022   ALT 104 (H) 10/07/2022   ANIONGAP 9 10/07/2022    CBG (last 3)  Recent Labs    10/08/22 0636 10/08/22 1155 10/08/22 1651  GLUCAP 94 89 186*      Coagulation Profile: Recent Labs  Lab 10/07/22 1345 10/07/22 2331 10/08/22 0957  INR 4.0* 7.2* 7.5*     Radiology Studies: VAS [place] LOWER EXTREMITY VENOUS (DVT) (7a-7p)  Result Date: 10/07/2022  Lower Venous DVT Study Patient Name:  MALAYJA FREUND  Date of Exam:   10/07/2022 Medical Rec #: [id number]     Accession #:    [id number] Date of Birth: 06-04-1941     Patient Gender: F Patient Age:   89 years Exam Location:  [facility] Procedure:      VAS [place] LOWER EXTREMITY VENOUS (DVT) Referring Phys: Lacretia Leigh --------------------------------------------------------------------------------  Indications: Edema.  Comparison Study: no prior Performing Technologist: Archie Patten RVS  Examination Guidelines: A complete evaluation includes B-mode imaging, spectral Doppler, color Doppler, and power Doppler as needed of all accessible portions of each vessel. Bilateral testing is considered an integral part of a complete examination. Limited examinations for reoccurring indications may be performed as noted. The reflux portion of the exam is performed with the patient in reverse Trendelenburg.  +---------+---------------+---------+-----------+----------+-----------------+ RIGHT    CompressibilityPhasicitySpontaneityPropertiesThrombus Aging    +---------+---------------+---------+-----------+----------+-----------------+  CFV      Full  Yes      Yes                                    +---------+---------------+---------+-----------+----------+-----------------+ SFJ      Full                                                           +---------+---------------+---------+-----------+----------+-----------------+ FV Prox  None                                         Age Indeterminate +---------+---------------+---------+-----------+----------+-----------------+ FV Mid   None                                         Age Indeterminate +---------+---------------+---------+-----------+----------+-----------------+ FV DistalNone                                         Age Indeterminate +---------+---------------+---------+-----------+----------+-----------------+ PFV      None                                         Age Indeterminate +---------+---------------+---------+-----------+----------+-----------------+ POP      None           No       No                   Age Indeterminate +---------+---------------+---------+-----------+----------+-----------------+ PTV      None                                         Age Indeterminate +---------+---------------+---------+-----------+----------+-----------------+ PERO     None                                         Age Indeterminate +---------+---------------+---------+-----------+----------+-----------------+   +---------+---------------+---------+-----------+----------+-----------------+ LEFT     CompressibilityPhasicitySpontaneityPropertiesThrombus Aging    +---------+---------------+---------+-----------+----------+-----------------+ CFV      None           No       No                   Age Indeterminate +---------+---------------+---------+-----------+----------+-----------------+ SFJ      None                                         Age Indeterminate  +---------+---------------+---------+-----------+----------+-----------------+ FV Prox  None                                         Age  Indeterminate +---------+---------------+---------+-----------+----------+-----------------+ FV Mid   None                                         Age Indeterminate +---------+---------------+---------+-----------+----------+-----------------+ FV DistalNone                                         Age Indeterminate +---------+---------------+---------+-----------+----------+-----------------+ PFV      None                                         Age Indeterminate +---------+---------------+---------+-----------+----------+-----------------+ POP      None           No       No                   Age Indeterminate +---------+---------------+---------+-----------+----------+-----------------+ PTV      None                                         Age Indeterminate +---------+---------------+---------+-----------+----------+-----------------+ PERO     None                                         Age Indeterminate +---------+---------------+---------+-----------+----------+-----------------+ EIV                     No       No                   Age Indeterminate +---------+---------------+---------+-----------+----------+-----------------+     Summary: RIGHT: - Findings consistent with age indeterminate deep vein thrombosis involving the right femoral vein, right proximal profunda vein, right popliteal vein, right posterior tibial veins, and right peroneal veins. - No cystic structure found in the popliteal fossa.  LEFT: - Findings consistent with age indeterminate deep vein thrombosis involving the left common femoral vein, SF junction, left femoral vein, left proximal profunda vein, left popliteal vein, left posterior tibial veins, left peroneal veins, and EIV. - No cystic structure found in the popliteal fossa.  *See table(s) above  for measurements and observations. Electronically signed by Orlie Pollen on 10/07/2022 at 1:39:36 PM.    Final    CT Abdomen Pelvis W Contrast  Result Date: 10/07/2022 CLINICAL DATA:  Acute generalized abdominal pain EXAM: CT ABDOMEN AND PELVIS WITH CONTRAST TECHNIQUE: Multidetector CT imaging of the abdomen and pelvis was performed using the standard protocol following bolus administration of intravenous contrast. RADIATION DOSE REDUCTION: This exam was performed according to the departmental dose-optimization program which includes automated exposure control, adjustment of the mA and/or kV according to patient size and/or use of iterative reconstruction technique. CONTRAST:  185m OMNIPAQUE IOHEXOL 300 MG/ML  SOLN COMPARISON:  None Available. FINDINGS: Lower chest: No acute abnormality. Hepatobiliary: Normal hepatic contour. Subtle 1.1 cm low-attenuation lesion in the anterior hepatic dome (image 9 series 2). Punctate calcifications are present in the posterior aspect of the hepatic dome. These are of uncertain etiology. Mild intrahepatic biliary ductal dilatation. Marked distension of the gallbladder with  perhaps subtle gallbladder wall thickening. The common bile duct is also enlarged proximally at 1.5 cm. The bile duct and tapers to a normal diameter at the pancreatic head. Pancreas: Abnormal appearance of the pancreas. The pancreas is largely atrophied with diffuse dilation of the duct measuring up to 6 mm at the pancreatic head. Ill-defined enhancing nodule in the uncinate process measures approximately 2.0 x 1.4 cm. Spleen: Normal in size without focal abnormality. Adrenals/Urinary Tract: A nonspecific 1.7 cm left adrenal nodule measures 44 Hounsfield units. The right adrenal gland is normal. Mildly atrophic kidneys. Scattered low-attenuation lesions bilaterally are too small for accurate characterization but are statistically highly likely benign cysts. No follow-up imaging recommended. Slight renal  size discrepancy with the left kidney smaller than the right. Ureters and bladder are unremarkable. Stomach/Bowel: Small gastric fundal diverticulum. No evidence of bowel obstruction. Normal appendix. No focal mass. Vascular/Lymphatic: Extensive atherosclerotic vascular calcifications throughout the aorta. Suspect significant left renal artery stenosis. No aneurysm or dissection. Suspect pulmonary emboli within partially imaged segmental right lower lobe PE. Additionally, filling defects are present within the left common femoral and bilateral femoral veins consistent with bilateral lower extremity DVT. Reproductive: Uterus and bilateral adnexa are unremarkable. Other: Diffusely abnormal peritoneum. Extensive micro nodularity throughout the mesentery with more subtle changes in the omentum. Nodular enhancement present along the peritoneal reflections in the pelvic cul-de-sac consistent with implants. There is associated moderate ascites which is almost certainly malignant in nature. No abdominal wall hernia. Musculoskeletal: Grade 1 anterolisthesis of L4 on L5. T9 vertebral body hemangioma. No acute fracture, malalignment or bony lesion. IMPRESSION: 1. Bilateral lower extremity DVT. Recommend dedicated bilateral lower extremity duplex venous ultrasound 2 confirm and assess extent. 2. Suspect right lower lobe segmental pulmonary emboli. CTA of the chest recommended for confirmation. 3. Peritoneal carcinomatosis with malignant ascites. The primary lesion is suspected to be an ill-defined enhancing mass in the uncinate process of the pancreas which may reflect a neuroendocrine neoplasm. There is associated pancreatic ductal dilatation, diffuse pancreatic atrophy and significant dilation of the common bile duct and the gallbladder. Recommend clinical correlation for signs and symptoms of acute cholecystitis given the degree of gallbladder distension. 4. Subtle 1.1 cm low-attenuation lesion in the anterior aspect of the  hepatic dome concerning for hepatic metastatic disease. 5. Extensive atherosclerotic vascular disease with calcifications throughout the aorta and branch vessels. Probable hemodynamically significant stenosis of the left renal artery given relative atrophy of the left kidney. 6. Indeterminate left adrenal nodule measuring up to 1.7 cm. This may represent an adenoma or metastatic implant. 7. Grade 1 anterolisthesis of L4 on L5 with associated multilevel degenerative disc disease. 8. Additional ancillary findings as above. Electronically Signed   By: Jacqulynn Cadet M.D.   On: 10/07/2022 10:44   DG Chest Port 1 View  Result Date: 10/07/2022 CLINICAL DATA:  Cough EXAM: PORTABLE CHEST 1 VIEW COMPARISON:  Prior chest x-ray 08/28/2022 FINDINGS: Nonspecific airspace opacity in the periphery of the left lung base. No pneumothorax or pleural effusion. Cardiac and mediastinal contours are unchanged. No acute osseous abnormality. 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 Aortic atherosclerotic vascular calcifications. IMPRESSION: 1. Nonspecific patchy airspace opacity in the periphery of the left lung base. Differential considerations include pneumonia versus atelectasis. Electronically Signed   By: Jacqulynn Cadet M.D.   On: 10/07/2022 10:25       Hosie Poisson M.D. [facility] 10/08/2022, 5:22 PM  Available via Epic secure chat 7am-7pm After 7 pm, please refer to night coverage  provider listed on amion.

## 2022-10-08 NOTE — Progress Notes (Addendum)
Date and time results received: 10/08/22 1230 pm (use smartphrase ".now" to insert current time)  Test: Heparin level Critical Value: >1.10 PTT >200  Name of Provider Notified: Dr. Karleen Hampshire  Orders Received? Or Actions Taken?:MD aware and Pharmacy aware. New orders from Pharmacy to adjust Heparin Drip

## 2022-10-08 NOTE — Progress Notes (Signed)
ANTICOAGULATION CONSULT NOTE - Follow up [place] for heparin Indication:  VTE treatment  Allergies  Allergen Reactions   Dulaglutide Diarrhea   Gabapentin Diarrhea    Patient Measurements: Height: '5\' 5"'$  (165.1 cm) Weight: 77.1 kg (170 lb) IBW/kg (Calculated) : 57 Heparin Dosing Weight: 73 kg  Vital Signs: Temp: 98.3 F (36.8 C) (11/04 2342) Temp Source: Oral (11/04 2342) BP: 114/55 (11/04 2342) Pulse Rate: 103 (11/04 2342)  Labs: Recent Labs    10/07/22 0939 10/07/22 1345 10/07/22 1414 10/07/22 2331  HGB 9.5*  --   --  7.6*  HCT 30.4*  --   --  24.0*  PLT 233  --   --  206  APTT  --  50*  --   --   LABPROT  --  38.3*  --   --   INR  --  4.0*  --   --   HEPARINUNFRC  --   --  <0.10* >1.10*  CREATININE 2.20*  --   --   --      Estimated Creatinine Clearance: 20.9 mL/min (A) (by C-G formula based on SCr of 2.2 mg/dL (H)).   Medical History: Past Medical History:  Diagnosis Date   AKI (acute kidney injury) ([place]) 07/07/2019   Diabetes mellitus without complication (HCC)    GERD (gastroesophageal reflux disease)    Hyperlipidemia    Hypertension       Assessment: 81 year old female presented with abdominal pain, weakness. Baseline INR elevated (4.0) No anticoagulation noted PTA, baseline CBC consistent with baseline. Pharmacy consulted for heparin management.  CT abd/pelvis with: Bilateral lower extremity DVT. Recommend dedicated bilateral lower extremity duplex venous ultrasound to confirm and assess extent. Suspect right lower lobe segmental pulmonary emboli. CTA of the chest recommended for confirmation. New pancreatic mass  10/08/22 Heparin level > 1.1 (supratherapeutic) with heparin gtt @ 1050 units/hr RN verified that heparin level had been drawn from IV site on the arm opposite where IV heparin gtt infusing RN noted some bleeding from the IV site of blood draw and shared this had been present earlier in the day  Goal of Therapy:   Heparin level 0.3-0.7 units/ml Monitor platelets by anticoagulation protocol: Yes   Plan:  Hold heparin gtt x  1 hour Resume heparin gtt at decreased rate of 900 units/hr Check heparin level 8 hr after heparin resumed Daily CBC while on heparin infusion Monitor for s/sx bleeding, transition to oral  anticoagulation as indicated Plan for ERCP and GI note states need to hold heparin gtt 4 hours prior to procedure (timing of procedure unknown at this time)  Everette Rank, PharmD 10/08/2022 12:20 AM

## 2022-10-08 NOTE — Progress Notes (Signed)
Interventional Radiology Brief Note:  Repeat INR ordered prior to proceed with paracentesis.   Brynda Greathouse, MS RD PA-C

## 2022-10-08 NOTE — Plan of Care (Signed)
  Problem: Coping: Goal: Level of anxiety will decrease Outcome: Progressing   Problem: Elimination: Goal: Will not experience complications related to bowel motility Outcome: Progressing Goal: Will not experience complications related to urinary retention Outcome: Progressing   Problem: Pain Managment: Goal: General experience of comfort will improve Outcome: Progressing   

## 2022-10-08 NOTE — Progress Notes (Signed)
ANTICOAGULATION CONSULT NOTE - Follow Up Consult  Pharmacy Consult for Heparin Indication: VTE treatment  Allergies  Allergen Reactions   Dulaglutide Diarrhea   Gabapentin Diarrhea    Patient Measurements: Height: _0  (165.1 cm) Weight: 77.1 kg (170 lb) IBW/kg (Calculated) : 57 Heparin Dosing Weight: 73kg  Vital Signs: Temp: 98.2 F (36.8 C) (11/05 0656) Temp Source: Oral (11/05 0656) BP: 116/60 (11/05 0656) Pulse Rate: 97 (11/05 0656)  Labs: Recent Labs    10/07/22 0939 10/07/22 1345 10/07/22 1414 10/07/22 2331 10/08/22 0445  HGB 9.5*  --   --  7.6* 7.1*  HCT 30.4*  --   --  24.0* 22.5*  PLT 233  --   --  206  --   APTT  --  50*  --   --   --   LABPROT  --  38.3*  --  61.3*  --   INR  --  4.0*  --  7.2*  --   HEPARINUNFRC  --   --  <0.10* >1.10*  --   CREATININE 2.20*  --   --  2.04*  --     Estimated Creatinine Clearance: 22.6 mL/min (A) (by C-G formula based on SCr of 2.04 mg/dL (H)).   Medications:  Infusions:   heparin 900 Units/hr (10/08/22 0143)   lactated ringers 100 mL/hr at 10/08/22 0521   piperacillin-tazobactam (ZOSYN)  IV 3.375 g (10/08/22 0520)    Assessment: 80 yoF presented to ED on 11/4 with abdominal pain, weakness. CT showed bilateral LE DVT and suspected PE.  Pharmacy consulted for heparin management. Baseline INR elevated (4.0 > 7.2) No anticoagulation noted PTA, baseline CBC consistent with baseline.   Today, 10/08/2022: Heparin level > 1.1, remains supratherapeutic despite previous rate decreases - RN confirms labs were drawn on opposite arm from heparin infusion CBC:  Hgb improved to 8.0, Plt WNL No bleeding or complications reported.  No further IV site bleeding.  No issues with infusion.   SCr decreased to 2.04 Alk Phos remains elevated 3,286, AST/ALT 196/104, Tbili 8 Baseline INR elevated at 4, and increased to 7.  Recheck pending.    Goal of Therapy:  Heparin level 0.3-0.7 units/ml Monitor platelets by anticoagulation  protocol: Yes   Plan:  Hold x1 hour, then reduce to heparin IV infusion at 600 units/hr Heparin level in 8 hours Daily heparin level and CBC Follow up timing for ERCP (GI note states need to hold heparin gtt 4 hours prior)    Gretta Arab PharmD, BCPS [facility] main pharmacy [phone] 10/08/2022 11:30 AM

## 2022-10-09 ENCOUNTER — Inpatient Hospital Stay (HOSPITAL_COMMUNITY): Payer: Medicare Other

## 2022-10-09 ENCOUNTER — Encounter (HOSPITAL_COMMUNITY): Payer: Self-pay | Admitting: Radiology

## 2022-10-09 DIAGNOSIS — R630 Anorexia: Secondary | ICD-10-CM

## 2022-10-09 DIAGNOSIS — D689 Coagulation defect, unspecified: Secondary | ICD-10-CM

## 2022-10-09 DIAGNOSIS — K8689 Other specified diseases of pancreas: Secondary | ICD-10-CM | POA: Diagnosis not present

## 2022-10-09 DIAGNOSIS — C801 Malignant (primary) neoplasm, unspecified: Secondary | ICD-10-CM

## 2022-10-09 DIAGNOSIS — R634 Abnormal weight loss: Secondary | ICD-10-CM

## 2022-10-09 DIAGNOSIS — R180 Malignant ascites: Secondary | ICD-10-CM

## 2022-10-09 DIAGNOSIS — C259 Malignant neoplasm of pancreas, unspecified: Secondary | ICD-10-CM

## 2022-10-09 DIAGNOSIS — C786 Secondary malignant neoplasm of retroperitoneum and peritoneum: Secondary | ICD-10-CM | POA: Diagnosis not present

## 2022-10-09 DIAGNOSIS — K831 Obstruction of bile duct: Principal | ICD-10-CM

## 2022-10-09 DIAGNOSIS — R188 Other ascites: Secondary | ICD-10-CM | POA: Diagnosis not present

## 2022-10-09 DIAGNOSIS — R17 Unspecified jaundice: Secondary | ICD-10-CM

## 2022-10-09 LAB — HEMOGLOBIN AND HEMATOCRIT, BLOOD
HCT: 22.3 % — ABNORMAL LOW (ref 36.0–46.0)
Hemoglobin: 7 g/dL — ABNORMAL LOW (ref 12.0–15.0)

## 2022-10-09 LAB — CBC
HCT: 20.2 % — ABNORMAL LOW (ref 36.0–46.0)
Hemoglobin: 6.4 g/dL — CL (ref 12.0–15.0)
MCH: 29.2 pg (ref 26.0–34.0)
MCHC: 31.7 g/dL (ref 30.0–36.0)
MCV: 92.2 fL (ref 80.0–100.0)
Platelets: 209 10*3/uL (ref 150–400)
RBC: 2.19 MIL/uL — ABNORMAL LOW (ref 3.87–5.11)
RDW: 20 % — ABNORMAL HIGH (ref 11.5–15.5)
WBC: 10.6 10*3/uL — ABNORMAL HIGH (ref 4.0–10.5)
nRBC: 0 % (ref 0.0–0.2)

## 2022-10-09 LAB — DIC (DISSEMINATED INTRAVASCULAR COAGULATION)PANEL
D-Dimer, Quant: 2.2 ug/mL-FEU — ABNORMAL HIGH (ref 0.00–0.50)
Fibrinogen: 367 mg/dL (ref 210–475)
INR: 1.7 — ABNORMAL HIGH (ref 0.8–1.2)
Platelets: 204 10*3/uL (ref 150–400)
Prothrombin Time: 20 seconds — ABNORMAL HIGH (ref 11.4–15.2)
Smear Review: NONE SEEN
aPTT: 151 seconds — ABNORMAL HIGH (ref 24–36)

## 2022-10-09 LAB — PREPARE RBC (CROSSMATCH)

## 2022-10-09 LAB — COMPREHENSIVE METABOLIC PANEL
ALT: 78 U/L — ABNORMAL HIGH (ref 0–44)
AST: 130 U/L — ABNORMAL HIGH (ref 15–41)
Albumin: 2.2 g/dL — ABNORMAL LOW (ref 3.5–5.0)
Alkaline Phosphatase: 2067 U/L — ABNORMAL HIGH (ref 38–126)
Anion gap: 8 (ref 5–15)
BUN: 28 mg/dL — ABNORMAL HIGH (ref 8–23)
CO2: 21 mmol/L — ABNORMAL LOW (ref 22–32)
Calcium: 7.9 mg/dL — ABNORMAL LOW (ref 8.9–10.3)
Chloride: 106 mmol/L (ref 98–111)
Creatinine, Ser: 1.85 mg/dL — ABNORMAL HIGH (ref 0.44–1.00)
GFR, Estimated: 27 mL/min — ABNORMAL LOW (ref >60)
Glucose, Bld: 188 mg/dL — ABNORMAL HIGH (ref 70–99)
Potassium: 4.2 mmol/L (ref 3.5–5.1)
Sodium: 135 mmol/L (ref 135–145)
Total Bilirubin: 8.7 mg/dL — ABNORMAL HIGH (ref 0.3–1.2)
Total Protein: 4.8 g/dL — ABNORMAL LOW (ref 6.5–8.1)

## 2022-10-09 LAB — HEPARIN LEVEL (UNFRACTIONATED)
Heparin Unfractionated: 0.31 IU/mL (ref 0.30–0.70)
Heparin Unfractionated: 0.38 IU/mL (ref 0.30–0.70)

## 2022-10-09 LAB — GLUCOSE, CAPILLARY
Glucose-Capillary: 150 mg/dL — ABNORMAL HIGH (ref 70–99)
Glucose-Capillary: 152 mg/dL — ABNORMAL HIGH (ref 70–99)

## 2022-10-09 LAB — PREALBUMIN: Prealbumin: 12 mg/dL — ABNORMAL LOW (ref 18–38)

## 2022-10-09 MED ORDER — HYDROMORPHONE HCL 1 MG/ML IJ SOLN
0.5000 mg | Freq: Once | INTRAMUSCULAR | Status: AC
  Administered 2022-10-09: 0.5 mg via INTRAVENOUS
  Filled 2022-10-09: qty 0.5

## 2022-10-09 MED ORDER — SODIUM CHLORIDE 0.9% IV SOLUTION: Freq: Once | INTRAVENOUS | Status: AC

## 2022-10-09 MED ORDER — HYDROMORPHONE HCL 1 MG/ML IJ SOLN
0.5000 mg | INTRAMUSCULAR | Status: DC | PRN
  Administered 2022-10-09: 0.5 mg via INTRAVENOUS
  Filled 2022-10-09: qty 0.5

## 2022-10-09 MED ORDER — HYDROMORPHONE HCL 1 MG/ML IJ SOLN
0.5000 mg | INTRAMUSCULAR | Status: DC | PRN
  Administered 2022-10-10 – 2022-10-15 (×6): 1 mg via INTRAVENOUS
  Filled 2022-10-09 (×6): qty 1

## 2022-10-09 MED ORDER — BOOST / RESOURCE BREEZE PO LIQD CUSTOM
1.0000 | ORAL | Status: DC
  Administered 2022-10-09 – 2022-10-10 (×2): 1 via ORAL

## 2022-10-09 MED ORDER — OXYCODONE-ACETAMINOPHEN 5-325 MG PO TABS
1.0000 | ORAL_TABLET | ORAL | Status: DC | PRN
  Administered 2022-10-09: 2 via ORAL
  Filled 2022-10-09: qty 2

## 2022-10-09 NOTE — Progress Notes (Addendum)
Chaplain was able to complete notarization of Advanced Directive paperwork, provide three copies including the original to Blanket and her daughter, input a copy in her medical chart, and upload one to our ACP system.    Chaplain provided support, assurance, and presence.  Daughter appeared to be overwhelmed as she talked with physicians and had this paperwork completed.      10/09/22 1200  Clinical Encounter Type  Visited With Patient and family together  Visit Type Follow-up;Social support

## 2022-10-09 NOTE — Consult Note (Addendum)
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  HPI: Beth Garrison is a very charming 81 year old white female.  She had a [birth year] on [birth year].  Unfortunately, she was in the hospital.  She does have history of diabetes.  She thinks she has had diabetes for about 9 or 10 years.  She really cannot give too much history as to what is going on right now.  She thinks she has lost weight.  Her appetite is not that great.  About home, she said that she just got weak.  She had no energy..  She does have a history of chronic renal insufficiency.  She was brought to the ER by EMS.  From what I can tell, it seems as if things were going on for about 2 weeks.  Her urine is quite dark in color.  Her stools have been light.  When she got to the ER, her hemoglobin was 9.5.  Her bilirubin was 9.4.  Her alkaline phosphatase was 3300.  SGPT was 265 SGOT 131.  Her INR was 4.  Again she is not on any anticoagulation.  She did have a scan done.  This was of the abdomen pelvis.  It showed bilateral thromboembolic disease in the pelvic vessels.  She had a likely right lower lobe pulmonary embolism.  She had peritoneal carcinomatosis with ascites.  She had a mass in the uncinate process of the pancreas.  He also had there was a lesion in the liver.  She had lower extremity Dopplers done on 10/07/2022.  This showed bilateral thromboembolic disease involving the femoral veins down to the distal legs.  We did do a prealbumin on her today.  It was only 12.  She does not have much of an appetite.  She does have some jaundice.   She smoked many years ago.  She does not drink.  I would have said that her overall, her performance status is not all that good.    Currently, her hemoglobin is 6.4.  MCV is 92.  Her BUN is 28 creatinine 1.85.  Calcium 7.9.  Her bilirubin is 8.7.  SGPT is 78 with an SGOT of 130.  Her alkaline phosphatase is 2067.  She really does not complain of any pain issues.  Currently, I would have said that her performance status is probably ECOG 3.   Past Medical History:  Diagnosis Date   AKI (acute kidney injury) ([place]) 07/07/2019   Diabetes mellitus without complication ([place])    GERD (gastroesophageal reflux disease)    Hyperlipidemia    Hypertension   :   Past Surgical History:  Procedure Laterality Date   BIOPSY  07/10/2019   Procedure: BIOPSY;  Surgeon: Carol Ada, MD;  Location: [facility];  Service: Endoscopy;;   ESOPHAGOGASTRODUODENOSCOPY (EGD) WITH PROPOFOL Left 07/10/2019   Procedure: ESOPHAGOGASTRODUODENOSCOPY (EGD) WITH PROPOFOL;  Surgeon: Carol Ada, MD;  Location: [facility];  Service: Endoscopy;  Laterality: Left;   TONSILLECTOMY AND ADENOIDECTOMY    :   Current Facility-Administered Medications:    0.9 %  sodium chloride infusion (Manually program via Guardrails IV Fluids), , Intravenous, Once, Hosie Poisson, MD   feeding supplement (BOOST / RESOURCE BREEZE) liquid 1 Container, 1 Container, Oral, Q24H, Akula, Vijaya, MD   heparin ADULT infusion 100 units/mL (  25000 units/235m), 600 Units/hr, Intravenous, Continuous, [place], CHaze Justin RPH, Last Rate: 6 mL/hr at 10/08/22 1236, 600 Units/hr at 10/08/22 1236   HYDROmorphone (DILAUDID) injection 0.5-1 mg, 0.5-1 mg, Intravenous, Q2H PRN, AHosie Poisson MD   ondansetron (ZOFRAN) tablet 4 mg, 4 mg, Oral, Q6H PRN **OR** ondansetron (ZOFRAN) injection 4 mg, 4 mg, Intravenous, Q6H PRN, OReubin Milan MD   Oral care mouth rinse, 15 mL, Mouth Rinse, PRN, OReubin Milan MD   oxyCODONE-acetaminophen (PERCOCET/ROXICET) 5-325 MG per tablet 1-2 tablet, 1-2 tablet, Oral, Q4H PRN, AHosie Poisson MD, 2 tablet at 10/09/22 1443   piperacillin-tazobactam  (ZOSYN) IVPB 3.375 g, 3.375 g, Intravenous, Q8H, Nandigam, Kavitha V, MD, Last Rate: 12.5 mL/hr at 10/09/22 1331, 3.375 g at 10/09/22 1331:   sodium chloride   Intravenous Once   feeding supplement  1 Container Oral Q24H  :   Allergies  Allergen Reactions   Dulaglutide Diarrhea   Gabapentin Diarrhea  :   Family History  Problem Relation Age of Onset   Diabetes Mother    Hyperlipidemia Mother    Diabetes Father    Diabetes Brother    Diabetes Maternal Grandmother    Diabetes Paternal Grandmother    Cancer Neg Hx    Stroke Neg Hx   :   Social History   Socioeconomic History   Marital status: Widowed    Spouse name: Not on file   Number of children: Not on file   Years of education: Not on file   Highest education level: Not on file  Occupational History   Not on file  Tobacco Use   Smoking status: Never   Smokeless tobacco: Never  Substance and Sexual Activity   Alcohol use: No   Drug use: No   Sexual activity: Yes    Birth control/protection: Post-menopausal  Other Topics Concern   Not on file  Social History Narrative   Not on file   Social Determinants of Health   Financial Resource Strain: Not on file  Food Insecurity: No Food Insecurity (10/07/2022)   Hunger Vital Sign    Worried About Running Out of Food in the Last Year: Never true    Ran Out of Food in the Last Year: Never true  Transportation Needs: No Transportation Needs (10/07/2022)   PRAPARE - THydrologist(Medical): No    Lack of Transportation (Non-Medical): No  Physical Activity: Not on file  Stress: Not on file  Social Connections: Not on file  Intimate Partner Violence: Not At Risk (10/07/2022)   Humiliation, Afraid, Rape, and Kick questionnaire    Fear of Current or Ex-Partner: No    Emotionally Abused: No    Physically Abused: No    Sexually Abused: No  :  Review of Systems  Constitutional:  Positive for malaise/fatigue and weight loss.  HENT:  Negative.    Eyes: Negative.   Respiratory:  Negative for shortness of breath.   Cardiovascular:  Positive for leg swelling.  Gastrointestinal:  Positive for abdominal pain and nausea.  Genitourinary: Negative.   Musculoskeletal: Negative.   Skin: Negative.   Neurological:  Positive for weakness.  Endo/Heme/Allergies: Negative.   Psychiatric/Behavioral: Negative.       Exam: Patient Vitals for the past 24 hrs:  BP Temp Temp src Pulse Resp SpO2  10/09/22 1649 (!) 114/55 97.9 F (36.6 C) Oral 87 18 96 %  10/09/22 1541 122/62 97.8 F (36.6 C) Oral 90 18 98 %  10/09/22 1520  104/64 98.2 F (36.8 C) Oral 87 16 98 %  10/09/22 1325 (!) 108/45 98.1 F (36.7 C) Oral 95 20 100 %  10/09/22 0548 (!) 113/47 98 F (36.7 C) Oral 89 18 94 %  10/09/22 0409 (!) 116/49 98.2 F (36.8 C) Oral 88 20 95 %  10/09/22 0342 (!) 119/48 98.1 F (36.7 C) Oral 90 19 95 %  10/09/22 0207 (!) 111/45 98.2 F (36.8 C) Oral 93 16 95 %  10/09/22 0041 (!) 119/43 98.3 F (36.8 C) Oral 95 17 95 %  10/09/22 0008 (!) 110/51 98.3 F (36.8 C) Oral (!) 101 19 96 %  10/08/22 2240 (!) 129/93 98.8 F (37.1 C) Oral 99 20 95 %  10/08/22 2042 (!) 131/48 97.8 F (36.6 C) Oral (!) 103 18 97 %  10/08/22 2011 (!) 96/56 99.1 F (37.3 C) Oral (!) 107 17 98 %   Her vital signs show a temperature of 97.9.  Pulse 87.  Blood pressure 114/55.  Physical Exam Vitals reviewed.  HENT:     Head: Normocephalic and atraumatic.  Eyes:     Pupils: Pupils are equal, round, and reactive to light.  Cardiovascular:     Rate and Rhythm: Normal rate and regular rhythm.     Heart sounds: Normal heart sounds.  Pulmonary:     Effort: Pulmonary effort is normal.     Breath sounds: Normal breath sounds.  Abdominal:     General: Bowel sounds are normal.     Palpations: Abdomen is soft.     Comments: Abdominal exam is soft.  There may be a little bit of distention.  There is no guarding or rebound tenderness.  I cannot detect any obvious  fluid wave.  She has no obvious abdominal mass.  There is no palpable liver or spleen tip.  Musculoskeletal:        General: No tenderness or deformity. Normal range of motion.     Cervical back: Normal range of motion.  Lymphadenopathy:     Cervical: No cervical adenopathy.  Skin:    General: Skin is warm and dry.     Findings: No erythema or rash.  Neurological:     Mental Status: She is alert and oriented to person, place, and time.  Psychiatric:        Behavior: Behavior normal.        Thought Content: Thought content normal.        Judgment: Judgment normal.     Recent Labs    10/08/22 0957 10/08/22 1845 10/09/22 0742 10/09/22 0800  WBC 13.7*  --   --  10.6*  HGB 8.0*  --   --  6.4*  HCT 25.4*  --   --  20.2*  PLT 224   < > 204 209   < > = values in this interval not displayed.    Recent Labs    10/07/22 2331 10/09/22 0742  NA 135 135  K 4.6 4.2  CL 108 106  CO2 18* 21*  GLUCOSE 128* 188*  BUN 31* 28*  CREATININE 2.04* 1.85*  CALCIUM 7.7* 7.9*    Blood smear review: none  Pathology: none    Assessment and Plan: Beth Garrison is a very nice 81 year old white female.  She has a lot going on right now.  Unfortunately, I think that this will end up being metastatic pancreatic cancer.  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  She is not  in good shape at all.  She does appear to be quite weak.  She is quite jaundiced.  I have been notified that her family and she wishes for comfort care.  Right now, I am worried that her hemoglobin is dropped quite a bit.  She is on heparin.  We will need to stop the heparin.  I think with that we should at least do a CT of the abdomen to make sure she does not have any retroperitoneal bleeding.  If there is any bleeding, then I might consider her for a filter.  I realize that the family does not want any invasive procedures to be done but I think the filter would be  reasonable option for her.  It does not sound like we are going to be able to get tissue diagnosis.  We will have to see what the CA 19-9 is.  I would think that this would be elevated.  She is very nice.  Unfortunately, I think her prognosis is going to be incredibly limited.  Given everything that I have seen so far, I would think that her prognosis is easily less than 1 month.  I do agree with Hospice.  I will have to speak to her daughter.  Her daughter is working right now.  I will try to get hold of her at work.  I know that she has been seen by quite a few physicians.  I know that everybody is leaning toward comfort with her.  We will follow along while she is in the hospital.  Lattie Haw, MD  2 Timothy 4:16-18   ADDENDUM: I spoke to Elwin Sleight, the daughter of Beth Garrison.  I told her what I thought was going on.  She agrees with Hospice.  However, she just is not can be able to take care of of her mom at home.  I think that given the seriousness of the situation with Beth Garrison, that she would be a great candidate for [employer].  I will call [facility] tomorrow and give them information on Beth Garrison.  I think once a bed opens up, she would be a very good candidate.  I really think that her prognosis is very limited.  I doubt that she will make it through the month of November.  I think that she makes it 2 or 3 weeks, that also might be optimistic.  Sharyn Lull agrees with this.  Again I think this would be a excellent idea.   Lattie Haw, MD

## 2022-10-09 NOTE — Progress Notes (Signed)
PT Cancellation Note  Patient Details Name: Beth Garrison MRN: [id number] DOB: August 02, 1941   Cancelled Treatment:    Reason Eval/Treat Not Completed: Medical issues which prohibited therapyto get blood. [place] Office [phone] Weekend [phone]    Claretha Arkwright 10/09/2022, 1:32 PM

## 2022-10-09 NOTE — Progress Notes (Signed)
ANTICOAGULATION CONSULT NOTE - Follow Up Consult  Pharmacy Consult for Heparin Indication: VTE treatment  Allergies  Allergen Reactions   Dulaglutide Diarrhea   Gabapentin Diarrhea    Patient Measurements: Height: '5\' 5"'$  (165.1 cm) Weight: 77.1 kg (170 lb) IBW/kg (Calculated) : 57 Heparin Dosing Weight: 73kg  Vital Signs: Temp: 98 F (36.7 C) (11/06 0548) Temp Source: Oral (11/06 0548) BP: 113/47 (11/06 0548) Pulse Rate: 89 (11/06 0548)  Labs: Recent Labs    10/07/22 0939 10/07/22 1345 10/07/22 2331 10/08/22 0445 10/08/22 0957 10/08/22 1845 10/08/22 2034 10/09/22 0742  HGB 9.5*  --  7.6* 7.1* 8.0*  --   --   --   HCT 30.4*  --  24.0* 22.5* 25.4*  --   --   --   PLT 233  --  206  --  224 215  --  204  APTT  --    < >  --   --  >200* >200*  --  151*  LABPROT  --    < > 61.3*  --  63.1* 55.7*  --  20.0*  INR  --    < > 7.2*  --  7.5* 6.4*  --  1.7*  HEPARINUNFRC  --    < > >1.10*  --  >1.10*  --  0.58 0.31  CREATININE 2.20*  --  2.04*  --   --   --   --  1.85*   < > = values in this interval not displayed.     Estimated Creatinine Clearance: 24.9 mL/min (A) (by C-G formula based on SCr of 1.85 mg/dL (H)).   Medications:  Infusions:   heparin 600 Units/hr (10/08/22 1236)   lactated ringers 100 mL/hr at 10/08/22 0521   piperacillin-tazobactam (ZOSYN)  IV 3.375 g (10/09/22 0531)    Assessment: 22 yoF presented to ED on 11/4 with abdominal pain, weakness. CT showed bilateral LE DVT and suspected PE.  Pharmacy consulted for heparin management. Also noted to have pancreatic mass and biliary obstruction leading to acute hepatic injury  Baseline INR elevated (4.0) No anticoagulation noted PTA, baseline CBC consistent with baseline.   Today, 10/09/2022: Heparin level now therapeutic x 2 on heparin 600 units/hr CBC: pending No bleeding or infusion issues reported SCr decreased to 1.85 Baseline INR remains elevated but decreased s/p FFP  Goal of Therapy:  Heparin  level 0.3-0.7 units/ml Monitor platelets by anticoagulation protocol: Yes   Plan:  Continue heparin at 600 units/hr Daily heparin level and CBC Follow up timing if ERCP planned (GI note states need to hold heparin gtt 4 hours prior)    Peggyann Juba, PharmD, BCPS Pharmacy: [phone] 10/09/2022, 10:21 AM

## 2022-10-09 NOTE — TOC Initial Note (Signed)
Transition of Care [facility]) - Initial/Assessment Note    Patient Details  Name: Beth Garrison MRN: [id number] Date of Birth: [birth year]  [facility] CM/SW Contact:    Dessa Phi, RN Phone Number: 10/09/2022, 12:27 PM  Clinical Narrative:  Active w/[facility] HHRN/PT/OT-rep [facility] following.                 Expected Discharge Plan: [place] Barriers to Discharge: Continued Medical Work up   Patient Goals and CMS Choice Patient states their goals for this hospitalization and ongoing recovery are::  (Home) CMS Medicare.gov Compare Post Acute Care list provided to:: Patient Represenative (must comment) (Michelle(dtr)) Choice offered to / list presented to : Adult Children  Expected Discharge Plan and Services Expected Discharge Plan: [place]   Discharge Planning Services: CM Consult Post Acute Care Choice: [place] arrangements for the past 2 months: Single Family Home                                      Prior Living Arrangements/Services Living arrangements for the past 2 months: Single Family Home Lives with:: Adult Children   Do you feel safe going back to the place where you live?: Yes          Current home services: Home RN, Home PT, Home OT (Active [facility] HHRN/PT/OT)    Activities of Daily Living Home Assistive Devices/Equipment: Cane (specify quad or straight), Walker (specify type) ADL Screening (condition at time of admission) Patient's cognitive ability adequate to safely complete daily activities?: Yes Is the patient deaf or have difficulty hearing?: No Does the patient have difficulty seeing, even when wearing glasses/contacts?: No Does the patient have difficulty concentrating, remembering, or making decisions?: No Patient able to express need for assistance with ADLs?: Yes Does the patient have difficulty dressing or bathing?: Yes Independently performs ADLs?: No Communication:  Independent Grooming: Needs assistance Is this a change from baseline?: Change from baseline, expected to last >3 days Feeding: Independent Bathing: Needs assistance Is this a change from baseline?: Change from baseline, expected to last >3 days Toileting: Needs assistance Is this a change from baseline?: Change from baseline, expected to last >3days In/Out Bed: Independent Walks in Home: Independent with device (comment) Does the patient have difficulty walking or climbing stairs?: Yes Weakness of Legs: Both Weakness of Arms/Hands: Both  Permission Sought/Granted Permission sought to share information with : Case Manager Permission granted to share information with : Yes, Verbal Permission Granted              Emotional Assessment              Admission diagnosis:  Hyperbilirubinemia [E80.6] Acute kidney injury ([place]) [N17.9] Patient Active Problem List   Diagnosis Date Noted   Hyperbilirubinemia 10/07/2022   Pancreatic mass 10/07/2022   DVT, bilateral lower limbs ([place]) 10/07/2022   Obesity (BMI 30-39.9) 08/29/2022   Pressure injury of buttock, stage 1 08/27/2022   Hypernatremia 08/26/2022   Thrombocytopenia ([place]) 08/26/2022   Malnutrition of moderate degree 08/25/2022   Severe sepsis ([place]) 08/24/2022   Acute respiratory failure with hypoxia ([place]) 62/95/2841   Acute metabolic acidosis 32/44/0102   Hypothermia 08/23/2022   Hyponatremia 03/27/2021   Intractable nausea and vomiting 03/27/2021   Pressure injury of skin 03/27/2021   Sepsis ([place]) 03/26/2021   Acute lower UTI 03/26/2021   Stage 3b chronic  kidney disease (CKD) ([place]) 03/26/2021   Renal failure 07/07/2019   AKI (acute kidney injury) ([place]) 07/07/2019   Hyperkalemia 07/07/2019   Normocytic anemia 07/07/2019   Melena 07/07/2019   Routine general medical examination at a health care facility 03/23/2014   Other screening mammogram 03/23/2014   Type II diabetes mellitus with manifestations ([place]) 09/08/2013    Hyperlipidemia with target LDL less than 100 09/08/2013   HTN (hypertension) 09/08/2013   PCP:  Juanda Chance Pharmacy:   [place], [place] [id number] Phone: [phone] Fax: [phone]     Social Determinants of Health (SDOH) Interventions    Readmission Risk Interventions     No data to display

## 2022-10-09 NOTE — Progress Notes (Signed)
Initial Nutrition Assessment  DOCUMENTATION CODES:  Non-severe (moderate) malnutrition in context of chronic illness  INTERVENTION:  -Advance diet as clinically appropriate -Trial Boost Breeze (250kcal, 9g protein) -When diet may be advance, recommend low fat -Consider adding insulin as diet is advanced -Resume MVI as clinically appropriate -Adjust ONS as diet is advanced-Carnation Breakfast Essential (CBE) with low fat milk or Ensure Max will provide least amount of fat/serving.  NUTRITION DIAGNOSIS:  Moderate Malnutrition related to chronic illness as evidenced by percent weight loss, energy intake < or equal to 75% for > or equal to 1 month, moderate fat depletion, moderate muscle depletion.  GOAL:  Patient will meet greater than or equal to 90% of their needs  MONITOR:  PO intake, Supplement acceptance, Diet advancement  REASON FOR ASSESSMENT:  Malnutrition Screening Tool    ASSESSMENT:  Pt is an 81yo F with PMH of AKI, stage 3a CKD, hyperkalemia, melena, sepsis, type II DM, type 2 diabetes, GERD, HLD, HTN, intractable nausea and vomiting, and unspecified anemia who presents with abdominal pain, decreased appetite, nausea, weight loss and generalized weakness for the past 2 weeks.   CT abdomen and pelvis on admission shows, "Peritoneal carcinomatosis with malignant ascites. The primary lesion is suspected to be an mass in the uncinate process of the pancreas which may reflect a neuroendocrine neoplasm."  Visited pt at bedside this afternoon. She reports very poor po intake x 1 month PTA. She reports taking just a bite or 2 at meals and then feeling too full to continue. Diet recall meeting <75% estimated nutrient needs for 1 month. In this time, pt reports a significant 7% (13#) unintended weight loss. NFPE shows moderate muscle wasting, moderate fat loss and mild edema. Pt meets ASPEN criteria for moderate protein calorie malnutrition.  Diet was advanced to clear liquid this  afternoon. Pt reports she tolerated the [place] ice. No n/v or increased abdominal pain noted. She is agreeable to try Boost Breeze this afternoon to provide 250kcal and 9g protein/bottle. No currently on insulin or DM medications, but may benefit from coverage as diet is advanced. When diet may be advanced to solids, recommend low fat diet for comfort. Adjust ONS with diet advancement (CBE w/ 1% milk or Ensure Max will provide least fat/bottle).  Medications reviewed and include: LR_0 /hr, zosyn   Labs reviewed: BG:89-246, A1c:6.8, Alk Phos:2067, AST:130, ALT:78, GFR:27, Cr:1.85, BUN:28  NUTRITION - FOCUSED PHYSICAL EXAM:  Flowsheet Row Most Recent Value  Orbital Region Mild depletion  Upper Arm Region Severe depletion  Thoracic and Lumbar Region Moderate depletion  Buccal Region Moderate depletion  Temple Region Moderate depletion  Clavicle Bone Region Moderate depletion  Clavicle and Acromion Bone Region Moderate depletion  Scapular Bone Region Unable to assess  Dorsal Hand Moderate depletion  Patellar Region Mild depletion  Anterior Thigh Region Mild depletion  Posterior Calf Region Mild depletion  Edema (RD Assessment) Mild  Hair Reviewed  [falling out more than usual]  Eyes Reviewed  Mouth Reviewed  [c/o taste changes]  Skin Reviewed  Nails Reviewed  [breaking more than usual]       Diet Order:   Diet Order             Diet clear liquid Room service appropriate? Yes; Fluid consistency: Thin  Diet effective now                   EDUCATION NEEDS:  Not appropriate for education at this time  Skin:  Skin Assessment: Skin Integrity  Issues: Skin Integrity Issues:: Unstageable Unstageable: bilateral heels  Last BM:  11/3  Height:  Ht Readings from Last 1 Encounters:  10/07/22 _0  (1.651 m)    Weight:  Wt Readings from Last 1 Encounters:  10/07/22 77.1 kg   BMI:  Body mass index is 28.29 kg/m.  Estimated Nutritional Needs:  Kcal:   2150-2350kcal Protein:  75-115g Fluid:  >1951m  KCandise Bowens MS, RD, LDN, CNSC See AMiON for contact information

## 2022-10-09 NOTE — Progress Notes (Addendum)
Subjective: Beth Garrison is a 81 year old white female with multiple medical problems including stage III kidney disease, AODM, GERD, hyperlipidemia, hypertension and anemia was brought to the hospital by EMS for abdominal pain anorexia abnormal weight loss and generalized weakness that she has been experiencing over the last couple of weeks.  CT scan of the abdomen pelvis showed bilateral lower extremity DVTs and pulmonary emboli in the right lower lobe there is also evidence of peritoneal carcinomatosis and malignant ascites with a ill-defined mass in the uncinate process of the pancreas still with pancreatic ductal dilatation diffuse pancreatic atrophy and dilatation of the CBD and gallbladder. She was noted to have elevated liver enzymes and ERCP was requested. I have reviewed her chart in detail and talked to the patient extensively this morning. Oncology and IR consults are pending.  Patient's hemoglobin dropped to 6.4 g/dL and she received 1 unit of packed red blood cells she is on a heparin drip for the DVTs and PE.  Objective: Vital signs in last 24 hours: Temp:  [97.8 F (36.6 C)-99.1 F (37.3 C)] 98 F (36.7 C) (11/06 0548) Pulse Rate:  [88-107] 89 (11/06 0548) Resp:  [16-20] 18 (11/06 0548) BP: (96-131)/(43-93) 113/47 (11/06 0548) SpO2:  [94 %-98 %] 94 % (11/06 0548) Last BM Date : 10/06/22  Intake/Output from previous day: 11/05 0701 - 11/06 0700 In: 3053.9 [P.O.:360; I.V.:1661; Blood:1001; IV Piggyback:31.9] Out: 700 [Urine:700] Intake/Output this shift: No intake/output data recorded.  General appearance: alert, cooperative, appears stated age, fatigued, and mild distress Resp: clear to auscultation bilaterally Cardio: regular rate and rhythm, S1, S2 normal, no murmur, click, rub or gallop GI: soft, diffusely tender on palpation with no guarding rebound or rigidity; bowel sounds normal; no masses,  no organomegaly Extremities: Patient has pressure sores on her heels and her  her feet bandaged  exam:[id number]}  Lab Results: Recent Labs    10/07/22 2331 10/08/22 0445 10/08/22 0957 10/08/22 1845 10/09/22 0742 10/09/22 0800  WBC 14.9*  --  13.7*  --   --  10.6*  HGB 7.6* 7.1* 8.0*  --   --  6.4*  HCT 24.0* 22.5* 25.4*  --   --  20.2*  PLT 206  --  224 215 204 209   BMET Recent Labs    10/07/22 0939 10/07/22 2331 10/09/22 0742  NA 132* 135 135  K 4.0 4.6 4.2  CL 105 108 106  CO2 17* 18* 21*  GLUCOSE 116* 128* 188*  BUN 34* 31* 28*  CREATININE 2.20* 2.04* 1.85*  CALCIUM 7.9* 7.7* 7.9*   LFT Recent Labs    10/09/22 0742  PROT 4.8*  ALBUMIN 2.2*  AST 130*  ALT 78*  ALKPHOS 2,067*  BILITOT 8.7*   PT/INR Recent Labs    10/08/22 1845 10/09/22 0742  LABPROT 55.7* 20.0*  INR 6.4* 1.7*   No results found.  Medications: I have reviewed the patient's current medications. Prior to Admission:  Medications Prior to Admission  Medication Sig Dispense Refill Last Dose   acetaminophen (TYLENOL) 500 MG tablet Take 500 mg by mouth every 6 (six) hours as needed for mild pain.   Past Week   Multiple Vitamin (MULTIVITAMIN WITH MINERALS) TABS tablet Take 1 tablet by mouth daily.   10/06/2022   pantoprazole (PROTONIX) 40 MG tablet Take 1 tablet (40 mg total) by mouth 2 (two) times daily.   10/06/2022   pravastatin (PRAVACHOL) 80 MG tablet Take 80 mg by mouth daily.   10/06/2022   TRESIBA  FLEXTOUCH 200 UNIT/ML FlexTouch Pen Inject 10 Units into the skin daily.   Past Week   diclofenac Sodium (VOLTAREN) 1 % GEL Apply 2 g topically 4 (four) times daily. (Patient not taking: Reported on 10/07/2022) 50 g 0 Not Taking   ipratropium-albuterol (DUONEB) 0.5-2.5 (3) MG/3ML SOLN Take 3 mLs by nebulization every 6 (six) hours as needed (shortness of breath). 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 I am waiting to hear from Dr. Marin Olp who is to see the patient in consultation but I feel comfort care might be the best for this patient.  I think we has a team need to define goals for the patient and keep her comfortable if we all agree to do this.  I tried to reach her daughter and her granddaughter but I was unable to do so.  I will try again and have a detailed discussion with them and see how they feel about my decision.  Continue supportive care for now. Addendum: I spoke to the patient's daughter [name] over the phone 4 [phone] and she has agreed to make the patient comfort care only.  She would like her mother to eat and be comfortable and not have any invasive procedures done.  Informed Dr. Karleen Hampshire about this decision as well.  LOS: 2 days   Juanita Craver 10/09/2022, 12:50 PM

## 2022-10-09 NOTE — Progress Notes (Signed)
ANTICOAGULATION CONSULT NOTE - Follow Up Consult  Pharmacy Consult for Heparin Indication: VTE treatment  Allergies  Allergen Reactions   Dulaglutide Diarrhea   Gabapentin Diarrhea    Patient Measurements: Height: '5\' 5"'$  (165.1 cm) Weight: 77.1 kg (170 lb) IBW/kg (Calculated) : 57 Heparin Dosing Weight: 73kg  Vital Signs: Temp: 97.8 F (36.6 C) (11/06 1541) Temp Source: Oral (11/06 1541) BP: 122/62 (11/06 1541) Pulse Rate: 90 (11/06 1541)  Labs: Recent Labs    10/07/22 0939 10/07/22 1345 10/07/22 2331 10/08/22 0445 10/08/22 0957 10/08/22 1845 10/08/22 2034 10/09/22 0742 10/09/22 0800 10/09/22 1558  HGB 9.5*  --  7.6* 7.1* 8.0*  --   --   --  6.4*  --   HCT 30.4*  --  24.0* 22.5* 25.4*  --   --   --  20.2*  --   PLT 233  --  206  --  224 215  --  204 209  --   APTT  --    < >  --   --  >200* >200*  --  151*  --   --   LABPROT  --    < > 61.3*  --  63.1* 55.7*  --  20.0*  --   --   INR  --    < > 7.2*  --  7.5* 6.4*  --  1.7*  --   --   HEPARINUNFRC  --    < > >1.10*  --  >1.10*  --  0.58 0.31  --  0.38  CREATININE 2.20*  --  2.04*  --   --   --   --  1.85*  --   --    < > = values in this interval not displayed.     Estimated Creatinine Clearance: 24.9 mL/min (A) (by C-G formula based on SCr of 1.85 mg/dL (H)).   Medications:  Infusions:   heparin 600 Units/hr (10/08/22 1236)   piperacillin-tazobactam (ZOSYN)  IV 3.375 g (10/09/22 1331)    Assessment: 34 yoF presented to ED on 11/4 with abdominal pain, weakness. CT showed bilateral LE DVT and suspected PE.  Pharmacy consulted for heparin management. Also noted to have pancreatic mass and biliary obstruction leading to acute hepatic injury  Baseline INR elevated (4.0) No anticoagulation noted PTA, baseline CBC consistent with baseline.   Today, 10/09/2022: Heparin level now therapeutic x 3 on heparin 600 units/hr CBC: Hgb down 6.4 and ok'd by provider to continue heparin No bleeding or infusion issues  reported SCr decreased to 1.85 Baseline INR remains elevated but decreased s/p FFP  Goal of Therapy:  Heparin level 0.3-0.7 units/ml Monitor platelets by anticoagulation protocol: Yes   Plan:  Continue heparin at 600 units/hr Daily heparin level and CBC Follow up timing if ERCP planned (GI note states need to hold heparin gtt 4 hours prior)    Thank you for allowing pharmacy to be a part of this patient's care.  Royetta Asal, PharmD, BCPS Clinical Pharmacist [place] Please utilize Amion for appropriate phone number to reach the unit pharmacist ([place]) 10/09/2022 4:48 PM

## 2022-10-09 NOTE — Progress Notes (Signed)
Chaplain engaged in an initial visit with Beth Garrison and her daughter.  They desire to have her healthcare POA documents notarized.  Chaplain looked through their paperwork and will complete notarization later today.  [name] needed to go for an MRI and also had physicians visiting to speak with her and her daughter. Daughter became emotional as she had just spoken to a doctor by phone.  Chaplain offered support and affirmed her presence for her mom.   Chaplain will follow-up to get paperwork witnessed, signed, and notarized.     10/09/22 1100  Clinical Encounter Type  Visited With Patient and family together  Visit Type Initial;Social support  Spiritual Encounters  Spiritual Needs Literature;Brochure

## 2022-10-09 NOTE — Progress Notes (Addendum)
[facility]                                                                               [name], is a 81 y.o. female, DOB - [birth year], UXN:[id number] Admit date - 10/07/2022    Outpatient Primary MD for the patient is Mindi Curling, PA-C  LOS - 2  days    Brief summary    Beth Garrison is a 81 y.o. female with medical history significant of  AKI, stage IIIa CKD, hyperkalemia, history of melena, history of sepsis, type II DM, type 2 diabetes, GERD, hyperlipidemia, hypertension, intractable nausea and vomiting, unspecified anemia who is brought via EMS due to abdominal pain, decreased appetite, nausea, weight loss and generalized weakness for the past 2 weeks.     CT abdomen/pelvis with bilateral lower extremity DVT and suspicion for right lower lobe segmental pulmonary emboli.  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  Ill-defined mass in the uncinate process of the pancreas.  Associated pancreatic ductal dilatation, diffuse pancreatic atrophy and significant dilatation of the CBD and gallbladder.   GI and IR Consulted for recommendations.  After further discussions with family , GI, daughter and patient wanted to transition to comfort measures. Discussed with palliative, will request toc for hospice and discontinue the biopsy and other invasive tests.    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  - GI on board , awaiting recommendations regarding ERCP and biopsy -  IR consulted for diagnostic paracentesis with cytology and possible omental biopsy  - pain control.  - palliative care consulted for goals of care.  After further discussions with family , GI, daughter and patient wanted to transition to comfort measures. Discussed with palliative, will request toc for hospice and discontinue the biopsy and other invasive tests.       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  S/p 5 units of FFP.     Bilateral DVT and suspected PE:  On Heparin gtt , possibly transition to eliquis in the morning.    Acute on STAGE 3 a CKD:  Creatinine at baseline between 1. 4 to 1.6.  Gently hydrate dand repeat renal parameters show improvement to 1.85.    Hypertension:  Well controlled BP parameters.   Type 2 DM:  CBG (last 3)  Recent Labs    10/08/22 2010 10/09/22 0717 10/09/22 1144  GLUCAP 246* 150* 152*   Continue with SSI for now.  She is on tresiba 10 units daily at home. Marland Kitchen    Hyperlipidemia:  On statin     Anemia of chronic disease:  Transfuse to keep hemoglobin greater than 7.    Leukocytosis:  Suspect reactive.  Empirically  on IV antibiotics which were discontinued.    Anemia of chronic disease/ anemia of malignancy;  Suspect some hemodilution.  Hemoglobin 6.4 this morning. S/p 1 unit of prbc transfusion.     Pressure injury:  RN Pressure Injury Documentation: Pressure Injury 03/27/21 Coccyx Medial Stage 1 -  Intact skin with non-blanchable redness of a localized area usually over a bony prominence.  reddened (Active)  03/27/21 0045  Location: Coccyx  Location Orientation: Medial  Staging: Stage 1 -  Intact skin with non-blanchable redness of a localized area usually over a bony prominence.  Wound Description (Comments): reddened  Present on Admission: Yes     Pressure Injury 03/27/21 Buttocks Right Deep Tissue Pressure Injury - Purple or maroon localized area of discolored intact skin or blood-filled blister due to damage of underlying soft tissue from pressure and/or shear. purplish area to right buttocks (Active)  03/27/21 0045  Location: Buttocks  Location Orientation: Right  Staging: Deep Tissue Pressure Injury - Purple or maroon localized area of discolored intact skin or blood-filled blister due to damage of underlying soft tissue from pressure  and/or shear.  Wound Description (Comments): purplish area to right buttocks  Present on Admission: Yes     Pressure Injury 08/26/22 Buttocks Lower;Medial Stage 1 -  Intact skin with non-blanchable redness of a localized area usually over a bony prominence. (Active)  08/26/22 1549  Location: Buttocks  Location Orientation: Lower;Medial  Staging: Stage 1 -  Intact skin with non-blanchable redness of a localized area usually over a bony prominence.  Wound Description (Comments):   Present on Admission: Yes     Pressure Injury 10/07/22 Heel Left Unstageable - Full thickness tissue loss in which the base of the injury is covered by slough (yellow, tan, gray, green or brown) and/or eschar (tan, brown or black) in the wound bed. 3 cm x 2 1/2 cm (Active)  10/07/22 1545  Location: Heel  Location Orientation: Left  Staging: Unstageable - Full thickness tissue loss in which the base of the injury is covered by slough (yellow, tan, gray, green or brown) and/or eschar (tan, brown or black) in the wound bed.  Wound Description (Comments): 3 cm x 2 1/2 cm  Present on Admission: Yes  Dressing Type Foam - Lift dressing to assess site every shift 10/08/22 1949     Pressure Injury 10/07/22 Heel Right Unstageable - Full thickness tissue loss in which the base of the injury is covered by slough (yellow, tan, gray, green or brown) and/or eschar (tan, brown or black) in the wound bed. 2 cm x 2 cm (Active)  10/07/22 1545  Location: Heel  Location Orientation: Right  Staging: Unstageable - Full thickness tissue loss in which the base of the injury is covered by slough (yellow, tan, gray, green or brown) and/or eschar (tan, brown or black) in the wound bed.  Wound Description (Comments): 2 cm x 2 cm  Present on Admission: Yes  Dressing Type Foam - Lift dressing to assess site every shift 10/08/22 1120   Local foam dressing applied.     Estimated body mass index is 28.29 kg/m as calculated from the  following:   Height as of this encounter: '5\' 5"'$  (1.651 m).   Weight as of this encounter: 77.1 kg.  Code Status: DNR DVT Prophylaxis:  Heparin.    Level of Care: Level of care: Med-Surg Family Communication: discussed with the patient's daughter at the bedside and over the phone.   Disposition Plan:     Remains inpatient appropriate:  further work up for pancreatic mass.   Procedures:  US paracentesis.   Consultants:   GI IR.  Palliative care.  Antimicrobials:   Anti-infectives (From admission, onward)    Start     Dose/Rate Route Frequency Ordered Stop   10/07/22 2000  piperacillin-tazobactam (ZOSYN) IVPB 3.375 g        3.375  g 12.5 mL/hr over 240 Minutes Intravenous Every 8 hours 10/07/22 1856          Medications  Scheduled Meds:  sodium chloride   Intravenous Once   sodium chloride   Intravenous Once   Continuous Infusions:  heparin 600 Units/hr (10/08/22 1236)   lactated ringers 100 mL/hr at 10/08/22 0521   piperacillin-tazobactam (ZOSYN)  IV 3.375 g (10/09/22 0531)   PRN Meds:.ondansetron **OR** ondansetron (ZOFRAN) IV, mouth rinse    Subjective:   Beth Garrison was seen and examined today.  Wants to eat  .  Objective:   Vitals:   10/09/22 0207 10/09/22 0342 10/09/22 0409 10/09/22 0548  BP: (!) 111/45 (!) 119/48 (!) 116/49 (!) 113/47  Pulse: 93 90 88 89  Resp: '16 19 20 18  '$ Temp: 98.2 F (36.8 C) 98.1 F (36.7 C) 98.2 F (36.8 C) 98 F (36.7 C)  TempSrc: Oral Oral Oral Oral  SpO2: 95% 95% 95% 94%  Weight:      Height:        Intake/Output Summary (Last 24 hours) at 10/09/2022 1228 Last data filed at 10/09/2022 0700 Gross per 24 hour  Intake 3053.88 ml  Output 700 ml  Net 2353.88 ml    Filed Weights   10/07/22 0951  Weight: 77.1 kg     Exam General exam: ill appearing jaundiced , mild distress from pain.  Respiratory system: diminished air entry at bases.  Cardiovascular system: S1 & S2 heard, RRR. No JVD,  No pedal  edema. Gastrointestinal system: Abdomen is soft, distended, mildy tender.  Normal bowel sounds heard. Central nervous system: Alert and oriented. No focal neurological deficits. Extremities: no cyanosis or clubbing.  Skin: pressure injury on the back.  Psychiatry: flat affect.    Data Reviewed:  I have personally reviewed following labs and imaging studies   CBC Lab Results  Component Value Date   WBC 10.6 (H) 10/09/2022   RBC 2.19 (L) 10/09/2022   HGB 6.4 (LL) 10/09/2022   HCT 20.2 (L) 10/09/2022   MCV 92.2 10/09/2022   MCH 29.2 10/09/2022   PLT 209 10/09/2022   MCHC 31.7 10/09/2022   RDW 20.0 (H) 10/09/2022   LYMPHSABS 1.8 10/07/2022   MONOABS 0.6 10/07/2022   EOSABS 0.1 10/07/2022   BASOSABS 0.1 92/42/6834     Last metabolic panel Lab Results  Component Value Date   NA 135 10/09/2022   K 4.2 10/09/2022   CL 106 10/09/2022   CO2 21 (L) 10/09/2022   BUN 28 (H) 10/09/2022   CREATININE 1.85 (H) 10/09/2022   GLUCOSE 188 (H) 10/09/2022   GFRNONAA 27 (L) 10/09/2022   GFRAA 36 (L) 07/15/2019   CALCIUM 7.9 (L) 10/09/2022   PHOS 2.8 03/27/2021   PROT 4.8 (L) 10/09/2022   ALBUMIN 2.2 (L) 10/09/2022   BILITOT 8.7 (H) 10/09/2022   ALKPHOS 2,067 (H) 10/09/2022   AST 130 (H) 10/09/2022   ALT 78 (H) 10/09/2022   ANIONGAP 8 10/09/2022    CBG (last 3)  Recent Labs    10/08/22 2010 10/09/22 0717 10/09/22 1144  GLUCAP 246* 150* 152*       Coagulation Profile: Recent Labs  Lab 10/07/22 1345 10/07/22 2331 10/08/22 0957 10/08/22 1845 10/09/22 0742  INR 4.0* 7.2* 7.5* 6.4* 1.7*      Radiology Studies: VAS [place] LOWER EXTREMITY VENOUS (DVT) (7a-7p)  Result Date: 10/07/2022  Lower Venous DVT Study Patient Name:  ESTELENE CARMACK  Date of Exam:   10/07/2022 Medical Rec #:  [id number]     Accession #:    [id number] Date of Birth: May 24, 1941     Patient Gender: F Patient Age:   3 years Exam Location:  [facility] Procedure:      VAS [place] LOWER EXTREMITY VENOUS (DVT)  Referring Phys: Lacretia Leigh --------------------------------------------------------------------------------  Indications: Edema.  Comparison Study: no prior Performing Technologist: Archie Patten RVS  Examination Guidelines: A complete evaluation includes B-mode imaging, spectral Doppler, color Doppler, and power Doppler as needed of all accessible portions of each vessel. Bilateral testing is considered an integral part of a complete examination. Limited examinations for reoccurring indications may be performed as noted. The reflux portion of the exam is performed with the patient in reverse Trendelenburg.  +---------+---------------+---------+-----------+----------+-----------------+ RIGHT    CompressibilityPhasicitySpontaneityPropertiesThrombus Aging    +---------+---------------+---------+-----------+----------+-----------------+ CFV      Full           Yes      Yes                                    +---------+---------------+---------+-----------+----------+-----------------+ SFJ      Full                                                           +---------+---------------+---------+-----------+----------+-----------------+ FV Prox  None                                         Age Indeterminate +---------+---------------+---------+-----------+----------+-----------------+ FV Mid   None                                         Age Indeterminate +---------+---------------+---------+-----------+----------+-----------------+ FV DistalNone                                         Age Indeterminate +---------+---------------+---------+-----------+----------+-----------------+ PFV      None                                         Age Indeterminate +---------+---------------+---------+-----------+----------+-----------------+ POP      None           No       No                   Age Indeterminate  +---------+---------------+---------+-----------+----------+-----------------+ PTV      None                                         Age Indeterminate +---------+---------------+---------+-----------+----------+-----------------+ PERO     None                                         Age Indeterminate +---------+---------------+---------+-----------+----------+-----------------+   +---------+---------------+---------+-----------+----------+-----------------+  LEFT     CompressibilityPhasicitySpontaneityPropertiesThrombus Aging    +---------+---------------+---------+-----------+----------+-----------------+ CFV      None           No       No                   Age Indeterminate +---------+---------------+---------+-----------+----------+-----------------+ SFJ      None                                         Age Indeterminate +---------+---------------+---------+-----------+----------+-----------------+ FV Prox  None                                         Age Indeterminate +---------+---------------+---------+-----------+----------+-----------------+ FV Mid   None                                         Age Indeterminate +---------+---------------+---------+-----------+----------+-----------------+ FV DistalNone                                         Age Indeterminate +---------+---------------+---------+-----------+----------+-----------------+ PFV      None                                         Age Indeterminate +---------+---------------+---------+-----------+----------+-----------------+ POP      None           No       No                   Age Indeterminate +---------+---------------+---------+-----------+----------+-----------------+ PTV      None                                         Age Indeterminate +---------+---------------+---------+-----------+----------+-----------------+ PERO     None                                          Age Indeterminate +---------+---------------+---------+-----------+----------+-----------------+ EIV                     No       No                   Age Indeterminate +---------+---------------+---------+-----------+----------+-----------------+     Summary: RIGHT: - Findings consistent with age indeterminate deep vein thrombosis involving the right femoral vein, right proximal profunda vein, right popliteal vein, right posterior tibial veins, and right peroneal veins. - No cystic structure found in the popliteal fossa.  LEFT: - Findings consistent with age indeterminate deep vein thrombosis involving the left common femoral vein, SF junction, left femoral vein, left proximal profunda vein, left popliteal vein, left posterior tibial veins, left peroneal veins, and EIV. - No cystic structure found in the popliteal fossa.  *See table(s) above for measurements and observations. Electronically signed by Orlie Pollen on 10/07/2022 at  1:39:36 PM.    Final        Hosie Poisson M.D. [facility] 10/09/2022, 12:28 PM  Available via Epic secure chat 7am-7pm After 7 pm, please refer to night coverage provider listed on amion.

## 2022-10-10 ENCOUNTER — Encounter (HOSPITAL_COMMUNITY): Payer: Self-pay | Admitting: Internal Medicine

## 2022-10-10 DIAGNOSIS — N179 Acute kidney failure, unspecified: Secondary | ICD-10-CM | POA: Diagnosis not present

## 2022-10-10 DIAGNOSIS — C259 Malignant neoplasm of pancreas, unspecified: Secondary | ICD-10-CM | POA: Diagnosis not present

## 2022-10-10 DIAGNOSIS — I82403 Acute embolism and thrombosis of unspecified deep veins of lower extremity, bilateral: Secondary | ICD-10-CM | POA: Diagnosis not present

## 2022-10-10 DIAGNOSIS — K8689 Other specified diseases of pancreas: Secondary | ICD-10-CM | POA: Diagnosis not present

## 2022-10-10 DIAGNOSIS — R188 Other ascites: Secondary | ICD-10-CM | POA: Diagnosis not present

## 2022-10-10 DIAGNOSIS — C786 Secondary malignant neoplasm of retroperitoneum and peritoneum: Secondary | ICD-10-CM | POA: Diagnosis not present

## 2022-10-10 LAB — BPAM FFP
Blood Product Expiration Date: 202311071411
Blood Product Expiration Date: 202311071412
Blood Product Expiration Date: 202311102359
Blood Product Expiration Date: 202311102359
Blood Product Expiration Date: 202311112359
ISSUE DATE / TIME: 202311052015
ISSUE DATE / TIME: 202311060021
ISSUE DATE / TIME: 202311060348
ISSUE DATE / TIME: 202311061438
ISSUE DATE / TIME: 202311061703
Unit Type and Rh: 600
Unit Type and Rh: 6200
Unit Type and Rh: 6200
Unit Type and Rh: 6200
Unit Type and Rh: 6200

## 2022-10-10 LAB — PREPARE FRESH FROZEN PLASMA
Unit division: 0
Unit division: 0
Unit division: 0
Unit division: 0

## 2022-10-10 LAB — HEPARIN LEVEL (UNFRACTIONATED): Heparin Unfractionated: <0.1 IU/mL — ABNORMAL LOW (ref 0.30–0.70)

## 2022-10-10 LAB — CBC
HCT: 22.2 % — ABNORMAL LOW (ref 36.0–46.0)
Hemoglobin: 6.9 g/dL — CL (ref 12.0–15.0)
MCH: 29.1 pg (ref 26.0–34.0)
MCHC: 31.1 g/dL (ref 30.0–36.0)
MCV: 93.7 fL (ref 80.0–100.0)
Platelets: 229 10*3/uL (ref 150–400)
RBC: 2.37 MIL/uL — ABNORMAL LOW (ref 3.87–5.11)
RDW: 20.3 % — ABNORMAL HIGH (ref 11.5–15.5)
WBC: 11.8 10*3/uL — ABNORMAL HIGH (ref 4.0–10.5)
nRBC: 0.2 % (ref 0.0–0.2)

## 2022-10-10 LAB — COMPREHENSIVE METABOLIC PANEL
ALT: 75 U/L — ABNORMAL HIGH (ref 0–44)
AST: 131 U/L — ABNORMAL HIGH (ref 15–41)
Albumin: 2.1 g/dL — ABNORMAL LOW (ref 3.5–5.0)
Alkaline Phosphatase: 1865 U/L — ABNORMAL HIGH (ref 38–126)
Anion gap: 7 (ref 5–15)
BUN: 24 mg/dL — ABNORMAL HIGH (ref 8–23)
CO2: 21 mmol/L — ABNORMAL LOW (ref 22–32)
Calcium: 7.9 mg/dL — ABNORMAL LOW (ref 8.9–10.3)
Chloride: 106 mmol/L (ref 98–111)
Creatinine, Ser: 1.74 mg/dL — ABNORMAL HIGH (ref 0.44–1.00)
GFR, Estimated: 29 mL/min — ABNORMAL LOW (ref >60)
Glucose, Bld: 168 mg/dL — ABNORMAL HIGH (ref 70–99)
Potassium: 4.2 mmol/L (ref 3.5–5.1)
Sodium: 134 mmol/L — ABNORMAL LOW (ref 135–145)
Total Bilirubin: 9.7 mg/dL — ABNORMAL HIGH (ref 0.3–1.2)
Total Protein: 4.7 g/dL — ABNORMAL LOW (ref 6.5–8.1)

## 2022-10-10 LAB — CANCER ANTIGEN 19-9: CA 19-9: 4225 U/mL — ABNORMAL HIGH (ref 0–35)

## 2022-10-10 MED ORDER — MORPHINE SULFATE (CONCENTRATE) 10 MG/0.5ML PO SOLN
10.0000 mg | ORAL | Status: DC | PRN
  Administered 2022-10-11: 10 mg via ORAL
  Filled 2022-10-10: qty 0.5

## 2022-10-10 MED ORDER — ALUM & MAG HYDROXIDE-SIMETH 200-200-20 MG/5ML PO SUSP
30.0000 mL | ORAL | Status: DC | PRN
  Administered 2022-10-10: 30 mL via ORAL
  Filled 2022-10-10: qty 30

## 2022-10-10 NOTE — TOC Progression Note (Addendum)
Transition of Care [facility]) - Progression Note    Patient Details  Name: Beth Garrison MRN: [id number] Date of Birth: 09-05-1941  [facility] CM/SW Contact  Daritza Brees, Juliann Pulse, RN Phone Number: 10/10/2022, 11:00 AM  Clinical Narrative: Noted referral for hospice, & [facility] rep Melissa note-she received referral from Dr. Marin Olp.      Expected Discharge Plan: [place] Barriers to Discharge: Continued Medical Work up  Expected Discharge Plan and Services Expected Discharge Plan: [place]   Discharge Planning Services: CM Consult Post Acute Care Choice: [place] arrangements for the past 2 months: Single Family Home                                       Social Determinants of Health (SDOH) Interventions    Readmission Risk Interventions     No data to display

## 2022-10-10 NOTE — Plan of Care (Signed)
  Problem: Coping: Goal: Ability to adjust to condition or change in health will improve Outcome: Progressing   Problem: Pain Managment: Goal: General experience of comfort will improve Outcome: Progressing   Problem: Safety: Goal: Ability to remain free from injury will improve Outcome: Progressing

## 2022-10-10 NOTE — Consult Note (Signed)
Chief Complaint: Patient was seen in consultation today for bilateral thromboembolic disease  at the request of Burney Gauze, MD  Referring Physician(s): Burney Gauze, MD  Supervising Physician: Markus Daft  Patient Status: [facility] - In-pt  History of Present Illness: Beth Garrison is a 81 y.o. female with PMH of AKI, DM II, HLD and HTN. She presented to ED via EMS c/o abdomina pain, decreased appetite, nausea, weight loss, and generalized weakness x 2 weeks. CT AP showed bilateral lower extremity DVT, RLL PE, peritoneal carcinomatosis with malignant ascites and pancreatic mass.  Pt was admitted and oncology was consulted. Dr. Marin Olp consulted with patient. The decision was made to transfer to [facility] for comfort care. Patient has been referred to IR for IVC filter placement since she is unable to be anticoagulated for DVT due to Hgb 6.9 and not a candidate for transfusion.  Procedure approved by Dr. Anselm Pancoast, IR.   Patient denies fever, chills, chest pain, shortness of breath or cough. She endorses loss of appetite, abdominal pain, nausea/vomiting, swelling in legs and weakness. Patient states she last ate an [place] ice around 8 AM this morning.  She understands she can have nothing else by mouth until after the procedure.  Past Medical History:  Diagnosis Date   AKI (acute kidney injury) ([place]) 07/07/2019   Diabetes mellitus without complication ([place])    GERD (gastroesophageal reflux disease)    Hyperlipidemia    Hypertension     Past Surgical History:  Procedure Laterality Date   BIOPSY  07/10/2019   Procedure: BIOPSY;  Surgeon: Carol Ada, MD;  Location: [place];  Service: Endoscopy;;   ESOPHAGOGASTRODUODENOSCOPY (EGD) WITH PROPOFOL Left 07/10/2019   Procedure: ESOPHAGOGASTRODUODENOSCOPY (EGD) WITH PROPOFOL;  Surgeon: Carol Ada, MD;  Location: [place];  Service: Endoscopy;  Laterality: Left;   TONSILLECTOMY AND ADENOIDECTOMY      Allergies: Dulaglutide and  Gabapentin  Medications: Prior to Admission medications   Medication Sig Start Date End Date Taking? Authorizing Provider  acetaminophen (TYLENOL) 500 MG tablet Take 500 mg by mouth every 6 (six) hours as needed for mild pain.   Yes [provider]  Multiple Vitamin (MULTIVITAMIN WITH MINERALS) TABS tablet Take 1 tablet by mouth daily.   Yes [provider]  pantoprazole (PROTONIX) 40 MG tablet Take 1 tablet (40 mg total) by mouth 2 (two) times daily. 07/16/19  Yes Vasireddy, Grier Mitts, MD  pravastatin (PRAVACHOL) 80 MG tablet Take 80 mg by mouth daily. 08/15/22  Yes [provider]  TRESIBA FLEXTOUCH 200 UNIT/ML FlexTouch Pen Inject 10 Units into the skin daily. 11/29/20  Yes [provider]  diclofenac Sodium (VOLTAREN) 1 % GEL Apply 2 g topically 4 (four) times daily. Patient not taking: Reported on 10/07/2022 08/10/22   Varney Biles, MD  ipratropium-albuterol (DUONEB) 0.5-2.5 (3) MG/3ML SOLN Take 3 mLs by nebulization every 6 (six) hours as needed (shortness of breath). Patient not taking: Reported on 10/07/2022 08/30/22   Bonnielee Haff, MD     Family History  Problem Relation Age of Onset   Diabetes Mother    Hyperlipidemia Mother    Diabetes Father    Diabetes Brother    Diabetes Maternal Grandmother    Diabetes Paternal Grandmother    Cancer Neg Hx    Stroke Neg Hx     Social History   Socioeconomic History   Marital status: Widowed    Spouse name: Not on file   Number of children: Not on file   Years of education:  Not on file   Highest education level: Not on file  Occupational History   Not on file  Tobacco Use   Smoking status: Never   Smokeless tobacco: Never  Substance and Sexual Activity   Alcohol use: No   Drug use: No   Sexual activity: Yes    Birth control/protection: Post-menopausal  Other Topics Concern   Not on file  Social History Narrative   Not on file   Social Determinants of Health   Financial Resource  Strain: Not on file  Food Insecurity: No Food Insecurity (10/07/2022)   Hunger Vital Sign    Worried About Running Out of Food in the Last Year: Never true    Ran Out of Food in the Last Year: Never true  Transportation Needs: No Transportation Needs (10/07/2022)   PRAPARE - Hydrologist (Medical): No    Lack of Transportation (Non-Medical): No  Physical Activity: Not on file  Stress: Not on file  Social Connections: Not on file    Review of Systems: A 12 point ROS discussed and pertinent positives are indicated in the HPI above.  All other systems are negative.  Review of Systems  Constitutional:  Positive for appetite change. Negative for chills, fatigue and fever.  Respiratory:  Negative for cough and shortness of breath.   Cardiovascular:  Positive for leg swelling. Negative for chest pain.  Gastrointestinal:  Positive for abdominal pain, nausea and vomiting.  Musculoskeletal:  Positive for back pain.  Neurological:  Positive for weakness. Negative for dizziness and headaches.    Vital Signs: BP (!) 117/49   Pulse 83   Temp 97.7 F (36.5 C) (Oral)   Resp 16   Ht '5\' 5"'$  (1.651 m)   Wt 170 lb (77.1 kg)   SpO2 96%   BMI 28.29 kg/m      Physical Exam Vitals reviewed.  Constitutional:      General: She is not in acute distress.    Appearance: She is ill-appearing.  HENT:     Head: Normocephalic and atraumatic.     Mouth/Throat:     Mouth: Mucous membranes are dry.     Pharynx: Oropharynx is clear.  Eyes:     General: Scleral icterus present.     Extraocular Movements: Extraocular movements intact.     Pupils: Pupils are equal, round, and reactive to light.  Cardiovascular:     Rate and Rhythm: Normal rate and regular rhythm.     Pulses: Normal pulses.     Heart sounds: Normal heart sounds.  Pulmonary:     Effort: Pulmonary effort is normal. No respiratory distress.     Breath sounds: Normal breath sounds.  Abdominal:     General:  Bowel sounds are normal. There is no distension.     Palpations: Abdomen is soft.     Tenderness: There is no abdominal tenderness. There is no guarding.  Musculoskeletal:     Right lower leg: No edema.     Left lower leg: No edema.  Skin:    General: Skin is warm and dry.     Coloration: Skin is jaundiced.  Neurological:     Mental Status: She is alert and oriented to person, place, and time.  Psychiatric:        Mood and Affect: Mood normal.        Behavior: Behavior normal.        Thought Content: Thought content normal.  Judgment: Judgment normal.     Imaging: CT CHEST WO CONTRAST  Result Date: 10/09/2022 CLINICAL DATA:  Brain metastasis. Unknown primary. * Tracking Code: BO * EXAM: CT CHEST WITHOUT CONTRAST TECHNIQUE: Multidetector CT imaging of the chest was performed following the standard protocol without IV contrast. RADIATION DOSE REDUCTION: This exam was performed according to the departmental dose-optimization program which includes automated exposure control, adjustment of the mA and/or kV according to patient size and/or use of iterative reconstruction technique. COMPARISON:  CT 10/07/2022 FINDINGS: Cardiovascular: Coronary artery calcification and aortic atherosclerotic calcification. No acute cardiovascular findings. Mediastinum/Nodes: No axillary or supraclavicular adenopathy. No mediastinal or hilar adenopathy. No pericardial fluid. Esophagus normal. Lungs/Pleura: No suspicious pulmonary nodules. Band of atelectasis in the RIGHT middle lobe appears benign. Upper Abdomen: Moderate volume of free fluid. Musculoskeletal: No aggressive osseous lesion. IMPRESSION: 1. No pulmonary metastasis or primary pulmonary malignancy. 2. Coronary artery calcification and Aortic Atherosclerosis (ICD10-I70.0). 3. Moderate volume of intraperitoneal free fluid in the upper abdomen Electronically Signed   By: Suzy Bouchard M.D.   On: 10/09/2022 15:40   VAS [place] LOWER EXTREMITY VENOUS (DVT)  (7a-7p)  Result Date: 10/07/2022  Lower Venous DVT Study Patient Name:  LLOYD CULLINAN  Date of Exam:   10/07/2022 Medical Rec #: [id number]     Accession #:    [id number] Date of Birth: January 28, 1941     Patient Gender: F Patient Age:   21 years Exam Location:  [facility] Procedure:      VAS [place] LOWER EXTREMITY VENOUS (DVT) Referring Phys: Lacretia Leigh --------------------------------------------------------------------------------  Indications: Edema.  Comparison Study: no prior Performing Technologist: Archie Patten RVS  Examination Guidelines: A complete evaluation includes B-mode imaging, spectral Doppler, color Doppler, and power Doppler as needed of all accessible portions of each vessel. Bilateral testing is considered an integral part of a complete examination. Limited examinations for reoccurring indications may be performed as noted. The reflux portion of the exam is performed with the patient in reverse Trendelenburg.  +---------+---------------+---------+-----------+----------+-----------------+ RIGHT    CompressibilityPhasicitySpontaneityPropertiesThrombus Aging    +---------+---------------+---------+-----------+----------+-----------------+ CFV      Full           Yes      Yes                                    +---------+---------------+---------+-----------+----------+-----------------+ SFJ      Full                                                           +---------+---------------+---------+-----------+----------+-----------------+ FV Prox  None                                         Age Indeterminate +---------+---------------+---------+-----------+----------+-----------------+ FV Mid   None                                         Age Indeterminate +---------+---------------+---------+-----------+----------+-----------------+ FV DistalNone  Age Indeterminate  +---------+---------------+---------+-----------+----------+-----------------+ PFV      None                                         Age Indeterminate +---------+---------------+---------+-----------+----------+-----------------+ POP      None           No       No                   Age Indeterminate +---------+---------------+---------+-----------+----------+-----------------+ PTV      None                                         Age Indeterminate +---------+---------------+---------+-----------+----------+-----------------+ PERO     None                                         Age Indeterminate +---------+---------------+---------+-----------+----------+-----------------+   +---------+---------------+---------+-----------+----------+-----------------+ LEFT     CompressibilityPhasicitySpontaneityPropertiesThrombus Aging    +---------+---------------+---------+-----------+----------+-----------------+ CFV      None           No       No                   Age Indeterminate +---------+---------------+---------+-----------+----------+-----------------+ SFJ      None                                         Age Indeterminate +---------+---------------+---------+-----------+----------+-----------------+ FV Prox  None                                         Age Indeterminate +---------+---------------+---------+-----------+----------+-----------------+ FV Mid   None                                         Age Indeterminate +---------+---------------+---------+-----------+----------+-----------------+ FV DistalNone                                         Age Indeterminate +---------+---------------+---------+-----------+----------+-----------------+ PFV      None                                         Age Indeterminate +---------+---------------+---------+-----------+----------+-----------------+ POP      None           No       No                    Age Indeterminate +---------+---------------+---------+-----------+----------+-----------------+ PTV      None                                         Age Indeterminate +---------+---------------+---------+-----------+----------+-----------------+ PERO  None                                         Age Indeterminate +---------+---------------+---------+-----------+----------+-----------------+ EIV                     No       No                   Age Indeterminate +---------+---------------+---------+-----------+----------+-----------------+     Summary: RIGHT: - Findings consistent with age indeterminate deep vein thrombosis involving the right femoral vein, right proximal profunda vein, right popliteal vein, right posterior tibial veins, and right peroneal veins. - No cystic structure found in the popliteal fossa.  LEFT: - Findings consistent with age indeterminate deep vein thrombosis involving the left common femoral vein, SF junction, left femoral vein, left proximal profunda vein, left popliteal vein, left posterior tibial veins, left peroneal veins, and EIV. - No cystic structure found in the popliteal fossa.  *See table(s) above for measurements and observations. Electronically signed by Orlie Pollen on 10/07/2022 at 1:39:36 PM.    Final    CT Abdomen Pelvis W Contrast  Result Date: 10/07/2022 CLINICAL DATA:  Acute generalized abdominal pain EXAM: CT ABDOMEN AND PELVIS WITH CONTRAST TECHNIQUE: Multidetector CT imaging of the abdomen and pelvis was performed using the standard protocol following bolus administration of intravenous contrast. RADIATION DOSE REDUCTION: This exam was performed according to the departmental dose-optimization program which includes automated exposure control, adjustment of the mA and/or kV according to patient size and/or use of iterative reconstruction technique. CONTRAST:  138m OMNIPAQUE IOHEXOL 300 MG/ML  SOLN COMPARISON:  None Available.  FINDINGS: Lower chest: No acute abnormality. Hepatobiliary: Normal hepatic contour. Subtle 1.1 cm low-attenuation lesion in the anterior hepatic dome (image 9 series 2). Punctate calcifications are present in the posterior aspect of the hepatic dome. These are of uncertain etiology. Mild intrahepatic biliary ductal dilatation. Marked distension of the gallbladder with perhaps subtle gallbladder wall thickening. The common bile duct is also enlarged proximally at 1.5 cm. The bile duct and tapers to a normal diameter at the pancreatic head. Pancreas: Abnormal appearance of the pancreas. The pancreas is largely atrophied with diffuse dilation of the duct measuring up to 6 mm at the pancreatic head. Ill-defined enhancing nodule in the uncinate process measures approximately 2.0 x 1.4 cm. Spleen: Normal in size without focal abnormality. Adrenals/Urinary Tract: A nonspecific 1.7 cm left adrenal nodule measures 44 Hounsfield units. The right adrenal gland is normal. Mildly atrophic kidneys. Scattered low-attenuation lesions bilaterally are too small for accurate characterization but are statistically highly likely benign cysts. No follow-up imaging recommended. Slight renal size discrepancy with the left kidney smaller than the right. Ureters and bladder are unremarkable. Stomach/Bowel: Small gastric fundal diverticulum. No evidence of bowel obstruction. Normal appendix. No focal mass. Vascular/Lymphatic: Extensive atherosclerotic vascular calcifications throughout the aorta. Suspect significant left renal artery stenosis. No aneurysm or dissection. Suspect pulmonary emboli within partially imaged segmental right lower lobe PE. Additionally, filling defects are present within the left common femoral and bilateral femoral veins consistent with bilateral lower extremity DVT. Reproductive: Uterus and bilateral adnexa are unremarkable. Other: Diffusely abnormal peritoneum. Extensive micro nodularity throughout the mesentery  with more subtle changes in the omentum. Nodular enhancement present along the peritoneal reflections in the pelvic cul-de-sac consistent with implants. There is associated moderate ascites which is almost  certainly malignant in nature. No abdominal wall hernia. Musculoskeletal: Grade 1 anterolisthesis of L4 on L5. T9 vertebral body hemangioma. No acute fracture, malalignment or bony lesion. IMPRESSION: 1. Bilateral lower extremity DVT. Recommend dedicated bilateral lower extremity duplex venous ultrasound 2 confirm and assess extent. 2. Suspect right lower lobe segmental pulmonary emboli. CTA of the chest recommended for confirmation. 3. Peritoneal carcinomatosis with malignant ascites. The primary lesion is suspected to be an ill-defined enhancing mass in the uncinate process of the pancreas which may reflect a neuroendocrine neoplasm. There is associated pancreatic ductal dilatation, diffuse pancreatic atrophy and significant dilation of the common bile duct and the gallbladder. Recommend clinical correlation for signs and symptoms of acute cholecystitis given the degree of gallbladder distension. 4. Subtle 1.1 cm low-attenuation lesion in the anterior aspect of the hepatic dome concerning for hepatic metastatic disease. 5. Extensive atherosclerotic vascular disease with calcifications throughout the aorta and branch vessels. Probable hemodynamically significant stenosis of the left renal artery given relative atrophy of the left kidney. 6. Indeterminate left adrenal nodule measuring up to 1.7 cm. This may represent an adenoma or metastatic implant. 7. Grade 1 anterolisthesis of L4 on L5 with associated multilevel degenerative disc disease. 8. Additional ancillary findings as above. Electronically Signed   By: Jacqulynn Cadet M.D.   On: 10/07/2022 10:44   DG Chest Port 1 View  Result Date: 10/07/2022 CLINICAL DATA:  Cough EXAM: PORTABLE CHEST 1 VIEW COMPARISON:  Prior chest x-ray 08/28/2022 FINDINGS:  Nonspecific airspace opacity in the periphery of the left lung base. No pneumothorax or pleural effusion. Cardiac and mediastinal contours are unchanged. No acute osseous abnormality. 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 Aortic atherosclerotic vascular calcifications. IMPRESSION: 1. Nonspecific patchy airspace opacity in the periphery of the left lung base. Differential considerations include pneumonia versus atelectasis. Electronically Signed   By: Jacqulynn Cadet M.D.   On: 10/07/2022 10:25    Labs:  CBC: Recent Labs    10/07/22 2331 10/08/22 0445 10/08/22 0957 10/08/22 1845 10/09/22 0742 10/09/22 0800 10/09/22 2102 10/10/22 0552  WBC 14.9*  --  13.7*  --   --  10.6*  --  11.8*  HGB 7.6*   < > 8.0*  --   --  6.4* 7.0* 6.9*  HCT 24.0*   < > 25.4*  --   --  20.2* 22.3* 22.2*  PLT 206  --  224 215 204 209  --  229   < > = values in this interval not displayed.    COAGS: Recent Labs    10/07/22 1345 10/07/22 2331 10/08/22 0957 10/08/22 1845 10/09/22 0742  INR 4.0* 7.2* 7.5* 6.4* 1.7*  APTT 50*  --  >200* >200* 151*    BMP: Recent Labs    10/07/22 0939 10/07/22 2331 10/09/22 0742 10/10/22 0552  NA 132* 135 135 134*  K 4.0 4.6 4.2 4.2  CL 105 108 106 106  CO2 17* 18* 21* 21*  GLUCOSE 116* 128* 188* 168*  BUN 34* 31* 28* 24*  CALCIUM 7.9* 7.7* 7.9* 7.9*  CREATININE 2.20* 2.04* 1.85* 1.74*  GFRNONAA 22* 24* 27* 29*    LIVER FUNCTION TESTS: Recent Labs    10/07/22 0939 10/07/22 2331 10/09/22 0742 10/10/22 0552  BILITOT 9.4* 8.0* 8.7* 9.7*  AST 265* 196* 130* 131*  ALT 131* 104* 78* 75*  ALKPHOS 3,364* 3,286* 2,067* 1,865*  PROT 5.6* 4.4* 4.8* 4.7*  ALBUMIN 2.3* 1.9* 2.2* 2.1*    TUMOR MARKERS: No results for input(s): "AFPTM", "  CEA", "CA199", "CHROMGRNA" in the last 8760 hours.  Assessment and Plan: 81 year old female presents to IR for inferior vena cava filter placement.  Patient resting in bed.  She appears unwell  with jaundice and scleral icterus. She is alert and oriented, calm and pleasant. She is in no distress.  Creatinine 1.74, BUN 24, GFR 29, hemoglobin 6.9, INR 1.7  Risks and benefits discussed with the patient including, but not limited to bleeding, infection, contrast induced renal failure, filter fracture or migration which can lead to emergency surgery or even death, strut penetration with damage or irritation to adjacent structures and caval thrombosis.  All of the patient's questions were answered, patient is agreeable to proceed. Consent signed and in chart.  Thank you for this interesting consult.  I greatly enjoyed meeting Analeigha Nauman and look forward to participating in their care.  A copy of this report was sent to the requesting provider on this date.  Electronically Signed: Tyson Alias, NP 10/10/2022, 11:28 AM   I spent a total of 20 minutes in face to face in clinical consultation, greater than 50% of which was counseling/coordinating care for bilateral thromboembolic disease

## 2022-10-10 NOTE — Progress Notes (Signed)
[facility]                                                                               Beth Garrison, is a 81 y.o. female, DOB - Sep 17, 1941, HAL:[id number] Admit date - 10/07/2022    Outpatient Primary MD for the patient is Mindi Curling, PA-C  LOS - 3  days    Brief summary    Beth Garrison is a 81 y.o. female with medical history significant of  AKI, stage IIIa CKD, hyperkalemia, history of melena, history of sepsis, type II DM, type 2 diabetes, GERD, hyperlipidemia, hypertension, intractable nausea and vomiting, unspecified anemia who is brought via EMS due to abdominal pain, decreased appetite, nausea, weight loss and generalized weakness for the past 2 weeks.     CT abdomen/pelvis with bilateral lower extremity DVT and suspicion for right lower lobe segmental pulmonary emboli.  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  Ill-defined mass in the uncinate process of the pancreas.  Associated pancreatic ductal dilatation, diffuse pancreatic atrophy and significant dilatation of the CBD and gallbladder.   GI and IR, oncology  Consulted for recommendations.  After further discussions with family , GI, daughter and patient wanted to transition to comfort measures. Discussed with palliative, will request toc for hospice and discontinue the biopsy and other invasive tests.   Plan for IVC filter placement before she goes to [facility].    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  - GI on board , awaiting recommendations regarding ERCP and biopsy -  IR consulted for diagnostic paracentesis with cytology and possible omental biopsy  - pain control.  - palliative care consulted for goals of care.  After further discussions with family , GI, daughter and patient wanted to transition to comfort measures. Discussed with palliative, will request toc for hospice and  discontinue the biopsy and other invasive tests.   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  S/p 5 units of FFP.     Bilateral DVT and suspected PE:  On Heparin initially, plan for IVC filter placement before discharge.    Acute on STAGE 3 a CKD:  Creatinine at baseline between 1. 4 to 1.6.  Gently hydrate dand repeat renal parameters show improvement to 1.85.    Hypertension:    Type 2 DM:  CBG (last 3)  Recent Labs    10/08/22 2010 10/09/22 0717 10/09/22 1144  GLUCAP 246* 150* 152*     Hyperlipidemia:   Anemia of chronic disease:  Transfuse to keep hemoglobin greater than 7.    Leukocytosis:  Suspect reactive.  Empirically  on IV antibiotics which were discontinued.    Anemia of chronic disease/ anemia of malignancy;  Hemoglobin 6.4 S/p 1 unit of prbc transfusion.     Pressure injury:  RN Pressure Injury Documentation: Pressure Injury 03/27/21 Coccyx Medial Stage 1 -  Intact skin with non-blanchable redness of a localized area usually over a bony prominence. reddened (Active)  03/27/21 0045  Location: Coccyx  Location Orientation: Medial  Staging: Stage 1 -  Intact skin with non-blanchable  redness of a localized area usually over a bony prominence.  Wound Description (Comments): reddened  Present on Admission: Yes     Pressure Injury 03/27/21 Buttocks Right Deep Tissue Pressure Injury - Purple or maroon localized area of discolored intact skin or blood-filled blister due to damage of underlying soft tissue from pressure and/or shear. purplish area to right buttocks (Active)  03/27/21 0045  Location: Buttocks  Location Orientation: Right  Staging: Deep Tissue Pressure Injury - Purple or maroon localized area of discolored intact skin or blood-filled blister due to damage of underlying soft tissue from pressure and/or shear.  Wound Description (Comments): purplish area to right buttocks  Present on  Admission: Yes     Pressure Injury 08/26/22 Buttocks Lower;Medial Stage 1 -  Intact skin with non-blanchable redness of a localized area usually over a bony prominence. (Active)  08/26/22 1549  Location: Buttocks  Location Orientation: Lower;Medial  Staging: Stage 1 -  Intact skin with non-blanchable redness of a localized area usually over a bony prominence.  Wound Description (Comments):   Present on Admission: Yes     Pressure Injury 10/07/22 Heel Left Unstageable - Full thickness tissue loss in which the base of the injury is covered by slough (yellow, tan, gray, green or brown) and/or eschar (tan, brown or black) in the wound bed. 3 cm x 2 1/2 cm (Active)  10/07/22 1545  Location: Heel  Location Orientation: Left  Staging: Unstageable - Full thickness tissue loss in which the base of the injury is covered by slough (yellow, tan, gray, green or brown) and/or eschar (tan, brown or black) in the wound bed.  Wound Description (Comments): 3 cm x 2 1/2 cm  Present on Admission: Yes  Dressing Type Foam - Lift dressing to assess site every shift 10/09/22 2230     Pressure Injury 10/07/22 Heel Right Unstageable - Full thickness tissue loss in which the base of the injury is covered by slough (yellow, tan, gray, green or brown) and/or eschar (tan, brown or black) in the wound bed. 2 cm x 2 cm (Active)  10/07/22 1545  Location: Heel  Location Orientation: Right  Staging: Unstageable - Full thickness tissue loss in which the base of the injury is covered by slough (yellow, tan, gray, green or brown) and/or eschar (tan, brown or black) in the wound bed.  Wound Description (Comments): 2 cm x 2 cm  Present on Admission: Yes  Dressing Type Foam - Lift dressing to assess site every shift 10/09/22 2230   Local foam dressing applied.     Estimated body mass index is 28.29 kg/m as calculated from the following:   Height as of this encounter: '5\' 5"'$  (1.651 m).   Weight as of this encounter: 77.1  kg.  Code Status: DNR DVT Prophylaxis:  comfort measures.    Level of Care: Level of care: Med-Surg Family Communication: discussed with the patient's daughter at the bedside and over the phone.   Disposition Plan:     Remains inpatient appropriate:  further work up for pancreatic mass.   Procedures:  US paracentesis.   Consultants:   GI IR.  Palliative care.  Antimicrobials:   Anti-infectives (From admission, onward)    Start     Dose/Rate Route Frequency Ordered Stop   10/07/22 2000  piperacillin-tazobactam (ZOSYN) IVPB 3.375 g  Status:  Discontinued        3.375 g 12.5 mL/hr over 240 Minutes Intravenous Every 8 hours 10/07/22 1856 10/09/22 1708  Medications  Scheduled Meds:  sodium chloride   Intravenous Once   feeding supplement  1 Container Oral Q24H   Continuous Infusions:   PRN Meds:.HYDROmorphone (DILAUDID) injection, morphine CONCENTRATE, ondansetron **OR** ondansetron (ZOFRAN) IV, mouth rinse    Subjective:   Xiadani Damman was seen and examined today.  No new complaints. Comfortable.   Objective:   Vitals:   10/09/22 1742 10/09/22 1759 10/09/22 1909 10/10/22 1254  BP: (!) 90/59 (!) 106/47 (!) 117/49 110/62  Pulse: 89 86 83 95  Resp: '18 18 16 17  '$ Temp: 97.7 F (36.5 C) (!) 97.5 F (36.4 C) 97.7 F (36.5 C) 98.1 F (36.7 C)  TempSrc: Axillary Oral Oral Oral  SpO2: 96% 97% 96% 96%  Weight:      Height:        Intake/Output Summary (Last 24 hours) at 10/10/2022 1315 Last data filed at 10/10/2022 0412 Gross per 24 hour  Intake 1220.43 ml  Output 500 ml  Net 720.43 ml    Filed Weights   10/07/22 0951  Weight: 77.1 kg     Exam General exam: ill appearing jaundiced. Not in distress.  Respiratory system: Clear to auscultation. Respiratory effort normal. Cardiovascular system: S1 & S2 heard, RRR. No JVD, Gastrointestinal system: Abdomen is soft, distended, bs+ Central nervous system: Alert and oriented.  Extremities: Symmetric 5  x 5 power. Skin: No rashes,  Psychiatry: mood is appropriate.     Data Reviewed:  I have personally reviewed following labs and imaging studies   CBC Lab Results  Component Value Date   WBC 11.8 (H) 10/10/2022   RBC 2.37 (L) 10/10/2022   HGB 6.9 (LL) 10/10/2022   HCT 22.2 (L) 10/10/2022   MCV 93.7 10/10/2022   MCH 29.1 10/10/2022   PLT 229 10/10/2022   MCHC 31.1 10/10/2022   RDW 20.3 (H) 10/10/2022   LYMPHSABS 1.8 10/07/2022   MONOABS 0.6 10/07/2022   EOSABS 0.1 10/07/2022   BASOSABS 0.1 16/09/9603     Last metabolic panel Lab Results  Component Value Date   NA 134 (L) 10/10/2022   K 4.2 10/10/2022   CL 106 10/10/2022   CO2 21 (L) 10/10/2022   BUN 24 (H) 10/10/2022   CREATININE 1.74 (H) 10/10/2022   GLUCOSE 168 (H) 10/10/2022   GFRNONAA 29 (L) 10/10/2022   GFRAA 36 (L) 07/15/2019   CALCIUM 7.9 (L) 10/10/2022   PHOS 2.8 03/27/2021   PROT 4.7 (L) 10/10/2022   ALBUMIN 2.1 (L) 10/10/2022   BILITOT 9.7 (H) 10/10/2022   ALKPHOS 1,865 (H) 10/10/2022   AST 131 (H) 10/10/2022   ALT 75 (H) 10/10/2022   ANIONGAP 7 10/10/2022    CBG (last 3)  Recent Labs    10/08/22 2010 10/09/22 0717 10/09/22 1144  GLUCAP 246* 150* 152*       Coagulation Profile: Recent Labs  Lab 10/07/22 1345 10/07/22 2331 10/08/22 0957 10/08/22 1845 10/09/22 0742  INR 4.0* 7.2* 7.5* 6.4* 1.7*      Radiology Studies: CT CHEST WO CONTRAST  Result Date: 10/09/2022 CLINICAL DATA:  Brain metastasis. Unknown primary. * Tracking Code: BO * EXAM: CT CHEST WITHOUT CONTRAST TECHNIQUE: Multidetector CT imaging of the chest was performed following the standard protocol without IV contrast. RADIATION DOSE REDUCTION: This exam was performed according to the departmental dose-optimization program which includes automated exposure control, adjustment of the mA and/or kV according to patient size and/or use of iterative reconstruction technique. COMPARISON:  CT 10/07/2022 FINDINGS: Cardiovascular:  Coronary artery calcification and  aortic atherosclerotic calcification. No acute cardiovascular findings. Mediastinum/Nodes: No axillary or supraclavicular adenopathy. No mediastinal or hilar adenopathy. No pericardial fluid. Esophagus normal. Lungs/Pleura: No suspicious pulmonary nodules. Band of atelectasis in the RIGHT middle lobe appears benign. Upper Abdomen: Moderate volume of free fluid. Musculoskeletal: No aggressive osseous lesion. IMPRESSION: 1. No pulmonary metastasis or primary pulmonary malignancy. 2. Coronary artery calcification and Aortic Atherosclerosis (ICD10-I70.0). 3. Moderate volume of intraperitoneal free fluid in the upper abdomen Electronically Signed   By: Suzy Bouchard M.D.   On: 10/09/2022 15:40       Hosie Poisson M.D. [facility] 10/10/2022, 1:15 PM  Available via Epic secure chat 7am-7pm After 7 pm, please refer to night coverage provider listed on amion.

## 2022-10-10 NOTE — Progress Notes (Signed)
Overall, Beth Garrison is just very tired this morning.  Her bilirubin is up to 9.7.  Her BUN is 24 creatinine 1.74.  Her albumin is 2.1.  Her hemoglobin dropped yesterday.  She was on anticoagulation because of the thromboembolic disease.  This morning, her white cell count is 11.8.  Hemoglobin 6.9.  Platelet count 229,000.  We try to get a CT scan yesterday to make sure there is no retroperitoneal bleed.  However, she cannot handle contrast because of her renal insufficiency.  At this point, her hemoglobin is relatively stable.  I would not transfuse her.  Goal is to get her to [facility].  I really think this would be the best option for her.  It would certainly help out her family.  I will call [facility] this morning and see if they cannot come over and talk with her and her family.  I spoke with Ms. Bomkamp this morning about [employer].  She would be in agreement with this.  I do the one question about not we need to put in a filter to help prevent clots in her leg from going to her lung.  I know that she may have a small blood clot in the lung already.  I think it be reasonable to get a filter put in since anticoagulating her probably would be a little bit challenging.  I do not have back her CA 19-9 as of yet.  She is really not hungry.  I totally understand this.  Again, our goal is to get her over to [facility].  I really think this would be a very worthwhile and only option for her.  Comfort is our primary goal now.  I do think that the IVC filter would not be a bad idea for her.  Hopefully, this can be done.    Lattie Haw, MD  Colossians 3:23

## 2022-10-10 NOTE — Progress Notes (Signed)
[facility]) Hospital Liaison note  Received request from [place] for family interest in [facility]. [facility] is unable to offer a routine bed today. Hospital Liaison will follow up tomorrow or sooner if a room becomes available.   Please do not hesitate to call with questions.   Thank you,  Clementeen Hoof, DNP, RN   [place] (listed on AMION under [facility] [place]   [phone]

## 2022-10-10 NOTE — Progress Notes (Signed)
Physical Therapy Discharge Patient Details Name: Meira Wahba MRN: [id number] DOB: 1941-11-09 Today's Date: 10/10/2022 Time:  -     Patient discharged from PT services secondary to medical decline - noted now comfort care. [place] Office [phone] Weekend [phone]     Claretha Ryser 10/10/2022, 10:15 AM

## 2022-10-11 ENCOUNTER — Inpatient Hospital Stay (HOSPITAL_COMMUNITY): Payer: Medicare Other

## 2022-10-11 DIAGNOSIS — N1832 Chronic kidney disease, stage 3b: Secondary | ICD-10-CM

## 2022-10-11 DIAGNOSIS — K8689 Other specified diseases of pancreas: Secondary | ICD-10-CM | POA: Diagnosis not present

## 2022-10-11 DIAGNOSIS — Z66 Do not resuscitate: Secondary | ICD-10-CM | POA: Diagnosis not present

## 2022-10-11 HISTORY — PX: IR IVC FILTER PLMT / S&I /IMG GUID/MOD SED: IMG701

## 2022-10-11 LAB — COMPREHENSIVE METABOLIC PANEL
ALT: 84 U/L — ABNORMAL HIGH (ref 0–44)
AST: 130 U/L — ABNORMAL HIGH (ref 15–41)
Albumin: 2 g/dL — ABNORMAL LOW (ref 3.5–5.0)
Alkaline Phosphatase: 1999 U/L — ABNORMAL HIGH (ref 38–126)
Anion gap: 10 (ref 5–15)
BUN: 27 mg/dL — ABNORMAL HIGH (ref 8–23)
CO2: 22 mmol/L (ref 22–32)
Calcium: 8.2 mg/dL — ABNORMAL LOW (ref 8.9–10.3)
Chloride: 104 mmol/L (ref 98–111)
Creatinine, Ser: 1.95 mg/dL — ABNORMAL HIGH (ref 0.44–1.00)
GFR, Estimated: 26 mL/min — ABNORMAL LOW (ref >60)
Glucose, Bld: 239 mg/dL — ABNORMAL HIGH (ref 70–99)
Potassium: 4.2 mmol/L (ref 3.5–5.1)
Sodium: 136 mmol/L (ref 135–145)
Total Bilirubin: 9.9 mg/dL — ABNORMAL HIGH (ref 0.3–1.2)
Total Protein: 4.7 g/dL — ABNORMAL LOW (ref 6.5–8.1)

## 2022-10-11 LAB — CBC
HCT: 24.3 % — ABNORMAL LOW (ref 36.0–46.0)
Hemoglobin: 7.5 g/dL — ABNORMAL LOW (ref 12.0–15.0)
MCH: 29.3 pg (ref 26.0–34.0)
MCHC: 30.9 g/dL (ref 30.0–36.0)
MCV: 94.9 fL (ref 80.0–100.0)
Platelets: 276 10*3/uL (ref 150–400)
RBC: 2.56 MIL/uL — ABNORMAL LOW (ref 3.87–5.11)
RDW: 20.9 % — ABNORMAL HIGH (ref 11.5–15.5)
WBC: 13.8 10*3/uL — ABNORMAL HIGH (ref 4.0–10.5)
nRBC: 0 % (ref 0.0–0.2)

## 2022-10-11 LAB — HEPARIN INDUCED PLATELET AB (HIT ANTIBODY): Heparin Induced Plt Ab: 0.064 OD (ref 0.000–0.400)

## 2022-10-11 MED ORDER — BIOTENE DRY MOUTH MT LIQD: 15.0000 mL | OROMUCOSAL | Status: DC | PRN

## 2022-10-11 MED ORDER — LIDOCAINE HCL 1 % IJ SOLN
INTRAMUSCULAR | Status: AC
  Administered 2022-10-11: 1 mL via INTRADERMAL
  Filled 2022-10-11: qty 20

## 2022-10-11 MED ORDER — MIDAZOLAM HCL 2 MG/2ML IJ SOLN
INTRAMUSCULAR | Status: AC
  Filled 2022-10-11: qty 4

## 2022-10-11 MED ORDER — GLYCOPYRROLATE 0.2 MG/ML IJ SOLN: 0.2000 mg | INTRAMUSCULAR | Status: DC | PRN

## 2022-10-11 MED ORDER — HALOPERIDOL LACTATE 5 MG/ML IJ SOLN: 0.5000 mg | INTRAMUSCULAR | Status: DC | PRN

## 2022-10-11 MED ORDER — MIDAZOLAM HCL 2 MG/2ML IJ SOLN
INTRAMUSCULAR | Status: AC | PRN
  Administered 2022-10-11: .5 mg via INTRAVENOUS

## 2022-10-11 MED ORDER — FENTANYL CITRATE (PF) 100 MCG/2ML IJ SOLN
INTRAMUSCULAR | Status: AC | PRN
  Administered 2022-10-11: 25 ug via INTRAVENOUS

## 2022-10-11 MED ORDER — FENTANYL CITRATE (PF) 100 MCG/2ML IJ SOLN
INTRAMUSCULAR | Status: AC
  Filled 2022-10-11: qty 4

## 2022-10-11 MED ORDER — HALOPERIDOL LACTATE 2 MG/ML PO CONC: 0.5000 mg | ORAL | Status: DC | PRN

## 2022-10-11 MED ORDER — POLYVINYL ALCOHOL 1.4 % OP SOLN: 1.0000 [drp] | Freq: Four times a day (QID) | OPHTHALMIC | Status: DC | PRN

## 2022-10-11 MED ORDER — HALOPERIDOL 0.5 MG PO TABS: 0.5000 mg | ORAL_TABLET | ORAL | Status: DC | PRN

## 2022-10-11 MED ORDER — MORPHINE SULFATE (CONCENTRATE) 10 MG/0.5ML PO SOLN
10.0000 mg | ORAL | Status: DC | PRN
  Administered 2022-10-12 – 2022-10-14 (×3): 10 mg via ORAL
  Filled 2022-10-11 (×3): qty 0.5

## 2022-10-11 MED ORDER — IOHEXOL 300 MG/ML  SOLN
50.0000 mL | Freq: Once | INTRAMUSCULAR | Status: AC | PRN
  Administered 2022-10-11: 10 mL via INTRAVENOUS

## 2022-10-11 NOTE — Procedures (Signed)
Interventional Radiology Procedure Note  Procedure: IVC filter placement  Indication: DVT with contraindication to anticoagulation  Findings: Please refer to procedural dictation for full description.  Complications: None  EBL: < 10 mL  Miachel Roux, MD [phone]

## 2022-10-11 NOTE — Progress Notes (Signed)
PROGRESS NOTE    Beth Garrison  VEH:[id number] DOB: [birth year] DOA: 10/07/2022 PCP: Mindi Curling, PA-C  Brief Narrative: This 81 y.o. female with PMH significant of  CKD, stage IIIa , hyperkalemia, history of melena, history of sepsis, type II DM, GERD, hyperlipidemia, hypertension, intractable nausea and vomiting, unspecified anemia who is brought via EMS due to abdominal pain, decreased appetite, nausea, weight loss and generalized weakness for the past 2 weeks.  CT abdomen/pelvis with bilateral lower extremity DVT and suspicion for right lower lobe segmental pulmonary emboli.  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  Ill-defined mass in the uncinate process of the pancreas.  Associated pancreatic ductal dilatation, diffuse pancreatic atrophy and significant dilatation of the CBD and gallbladder. GI and IR, oncology  Consulted for recommendations.  After further discussions with family , GI, daughter and patient wanted to transition to comfort measures. Discussed with palliative, TOC consulted for hospice and discontinue the biopsy and other invasive tests.    She underwent IVC filter placement today.  Plan for or discharged to [facility] tomorrow   Assessment & Plan:   Principal Problem:   Hyperbilirubinemia Active Problems:   Type II diabetes mellitus with manifestations ([place])   Hyperlipidemia with target LDL less than 100   HTN (hypertension)   AKI (acute kidney injury) (HCC)   Normocytic anemia   Stage 3b chronic kidney disease (CKD) (HCC)   Hyponatremia   Pancreatic mass   DVT, bilateral lower limbs (HCC)  Pancreatic mass with hyperbilirubinemia: Biliary obstruction, peritoneal carcinomatosis and malignant ascites GI was consulted, patient may require ERCP and biopsy. IR was consulted for diagnostic paracentesis with cytology and possible omental biopsy  Continue adequate pain control. Palliative care consulted for goals of care.  After further  discussions with family , GI, daughter and patient wanted to transition to comfort measures.  Discussed with palliative, discontinue the biopsy and other invasive tests. Plan: Care transitioned to full comfort care.   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    Bilateral DVT and suspected PE: On Heparin initially, s/p IVC filter placement by IR today. Care transitioned to full comfort care.  AKI on  CKD stage IIIa Creatinine at baseline between 1. 4 to 1.6.  Continue IV hydration.  Renal functions improving. Care transitioned to comfort care.   Hypertension:  Blood pressure remains controlled.     Type 2 DM:  FS remains  controlled.   Hyperlipidemia:    Anemia of chronic disease:  Transfuse to keep hemoglobin greater than 7.    Leukocytosis:  Suspect reactive.  Empirically  on IV antibiotics which were discontinued.    Anemia of chronic disease/ anemia of malignancy;  Hemoglobin 7.5 S/p 1 unit of prbc transfusion.   DVT prophylaxis: Status post IVC filter. Code Status: DNR Family Communication: No family at bedside Disposition Plan:   Status is: Inpatient Remains inpatient appropriate because: Admitted for pancreatic mass causing biliary obstruction, also found to have bilateral DVT and PE underwent IVC filter placement.  Plan is to discharge to [facility] tomorrow.  Care transitioned to full comfort care   Consultants:  Oncology Palliative care  Procedures: IVC filter placement Antimicrobials:  Anti-infectives (From admission, onward)    Start     Dose/Rate Route Frequency Ordered Stop   10/07/22 2000  piperacillin-tazobactam (ZOSYN) IVPB 3.375 g  Status:  Discontinued        3.375 g 12.5 mL/hr over 240 Minutes Intravenous Every 8 hours 10/07/22  1856 10/09/22 1708       Subjective: Patient was seen and examined at bedside.  Overnight events noted.   She appears yellow but lies comfortably,  reports pain is reasonably  controlled.  Objective: Vitals:   10/11/22 1220 10/11/22 1251 10/11/22 1346 10/11/22 1417  BP: 136/84 (!) 118/58 (!) 113/53 (!) 120/54  Pulse: (!) 107 (!) 105 (!) 106 (!) 104  Resp: '17 16 16 17  '$ Temp:  98.3 F (36.8 C) (!) 97.5 F (36.4 C) 97.9 F (36.6 C)  TempSrc:  Oral Oral Oral  SpO2: 99% 93% 95% 94%  Weight:      Height:       No intake or output data in the 24 hours ending 10/11/22 1535 Filed Weights   10/07/22 0951  Weight: 77.1 kg    Examination:  General exam: Appears comfortable, not in any acute distress, appears yellow. Respiratory system: CTA bilaterally, respiratory effort normal, RR 15. Cardiovascular system: S1 & S2 heard, regular rate and rhythm, no murmur. Gastrointestinal system: Abdomen is soft, mildly tender, non distended, BS+ Central nervous system: Alert and oriented x 3. No focal neurological deficits. Extremities: No edema, no cyanosis, no clubbing. Skin: No rashes, lesions or ulcers Psychiatry: Judgement and insight appear normal. Mood & affect appropriate.     Data Reviewed: I have personally reviewed following labs and imaging studies  CBC: Recent Labs  Lab 10/07/22 0939 10/07/22 2331 10/08/22 0445 10/08/22 0957 10/08/22 1845 10/09/22 0742 10/09/22 0800 10/09/22 2102 10/10/22 0552 10/11/22 0530  WBC 14.5* 14.9*  --  13.7*  --   --  10.6*  --  11.8* 13.8*  NEUTROABS 11.7*  --   --   --   --   --   --   --   --   --   HGB 9.5* 7.6*   < > 8.0*  --   --  6.4* 7.0* 6.9* 7.5*  HCT 30.4* 24.0*   < > 25.4*  --   --  20.2* 22.3* 22.2* 24.3*  MCV 92.1 91.6  --  92.4  --   --  92.2  --  93.7 94.9  PLT 233 206  --  224 215 204 209  --  229 276   < > = values in this interval not displayed.   Basic Metabolic Panel: Recent Labs  Lab 10/07/22 0939 10/07/22 2331 10/09/22 0742 10/10/22 0552 10/11/22 0530  NA 132* 135 135 134* 136  K 4.0 4.6 4.2 4.2 4.2  CL 105 108 106 106 104  CO2 17* 18* 21* 21* 22  GLUCOSE 116* 128* 188* 168* 239*   BUN 34* 31* 28* 24* 27*  CREATININE 2.20* 2.04* 1.85* 1.74* 1.95*  CALCIUM 7.9* 7.7* 7.9* 7.9* 8.2*   GFR: Estimated Creatinine Clearance: 23.6 mL/min (A) (by C-G formula based on SCr of 1.95 mg/dL (H)). Liver Function Tests: Recent Labs  Lab 10/07/22 0939 10/07/22 2331 10/09/22 0742 10/10/22 0552 10/11/22 0530  AST 265* 196* 130* 131* 130*  ALT 131* 104* 78* 75* 84*  ALKPHOS 3,364* 3,286* 2,067* 1,865* 1,999*  BILITOT 9.4* 8.0* 8.7* 9.7* 9.9*  PROT 5.6* 4.4* 4.8* 4.7* 4.7*  ALBUMIN 2.3* 1.9* 2.2* 2.1* 2.0*   Recent Labs  Lab 10/07/22 0939  LIPASE 19   No results for input(s): "AMMONIA" in the last 168 hours. Coagulation Profile: Recent Labs  Lab 10/07/22 1345 10/07/22 2331 10/08/22 0957 10/08/22 1845 10/09/22 0742  INR 4.0* 7.2* 7.5* 6.4* 1.7*   Cardiac Enzymes:  No results for input(s): "CKTOTAL", "CKMB", "CKMBINDEX", "TROPONINI" in the last 168 hours. BNP (last 3 results) No results for input(s): "PROBNP" in the last 8760 hours. HbA1C: No results for input(s): "HGBA1C" in the last 72 hours. CBG: Recent Labs  Lab 10/08/22 1155 10/08/22 1651 10/08/22 2010 10/09/22 0717 10/09/22 1144  GLUCAP 89 186* 246* 150* 152*   Lipid Profile: No results for input(s): "CHOL", "HDL", "LDLCALC", "TRIG", "CHOLHDL", "LDLDIRECT" in the last 72 hours. Thyroid Function Tests: No results for input(s): "TSH", "T4TOTAL", "FREET4", "T3FREE", "THYROIDAB" in the last 72 hours. Anemia Panel: No results for input(s): "VITAMINB12", "FOLATE", "FERRITIN", "TIBC", "IRON", "RETICCTPCT" in the last 72 hours. Sepsis Labs: Recent Labs  Lab 10/07/22 0939 10/07/22 1221  LATICACIDVEN 1.8 1.2    No results found for this or any previous visit (from the past 240 hour(s)).   Radiology Studies: IR IVC FILTER PLMT / S&I Burke Keels GUID/MOD SED  Result Date: 10/11/2022 INDICATION: 81 year old woman with bilateral lower extremity DVT cannot be anticoagulated due to anemia. IR consulted for IVC  filter placement EXAM: IVC filter placement MEDICATIONS: None. ANESTHESIA/SEDATION: Moderate (conscious) sedation was employed during this procedure. A total of Versed 0.5 mg and Fentanyl 25 mcg was administered intravenously by the radiology nurse. Total intra-service moderate Sedation Time: 28 minutes. The patient's level of consciousness and vital signs were monitored continuously by radiology nursing throughout the procedure under my direct supervision. FLUOROSCOPY: Radiation Exposure Index (as provided by the fluoroscopic device): 323 mGy Kerma COMPLICATIONS: None immediate. PROCEDURE: Informed written consent was obtained from the patient after a thorough discussion of the procedural risks, benefits and alternatives. All questions were addressed. Maximal Sterile Barrier Technique was utilized including caps, mask, sterile gowns, sterile gloves, sterile drape, hand hygiene and skin antiseptic. A timeout was performed prior to the initiation of the procedure. Patient positioned supine on the angiography table. Right neck prepped and draped in usual sterile fashion. All elements of maximal sterile barrier were utilized including, cap, mask, sterile gown, sterile gloves, large sterile drape, hand scrubbing and 2% Chlorhexidine for skin cleaning. The right internal jugular vein was evaluated with ultrasound and shown to be patent. A permanent ultrasound image was obtained and placed in the patient's medical record. Using sterile gel and a sterile probe cover, the right internal jugular vein was entered with a 21 ga needle during real time ultrasound guidance. 21 gauge needle exchanged for a transitional dilator set over 0.018 inch guidewire. Transitional dilator set exchanged for 10 fr sheath over 0.035 inch guidewire. Sheath placed to the infrarenal IVC and CO2 venogram performed to delineate the position of the renal veins. The IVC and renal veins could not be well visualized with CO2 injection. Small amount of  contrast administered through the sheath showed that the tip was located in a small collateral vein alongside the IVC. The sheath was advanced into the IVC and venogram was performed with 10 mL of contrast. This delineated the position of the renal veins. Retrievable Denali IVC filter deployed in the infrarenal location. Sheath removed and hemostasis achieved with manual compression. IMPRESSION: Successful insertion of retrievable Denali IVC filter in infrarenal location PLAN: This IVC filter is potentially retrievable. The patient will be assessed for filter retrieval by Interventional Radiology in approximately 8-12 weeks. Further recommendations regarding filter retrieval, continued surveillance or declaration of device permanence, will be made at that time. Electronically Signed   By: Miachel Roux M.D.   On: 10/11/2022 13:04    Scheduled Meds:  sodium chloride  Intravenous Once   Continuous Infusions:   LOS: 4 days    Time spent: 50 mins    Shawna Clamp, MD [facility]   If 7PM-7AM, please contact night-coverage

## 2022-10-11 NOTE — Progress Notes (Signed)
Manufacturing engineer [facility]) Hospital Liaison note   Update on this [place] referral: Unfortunately, [place] is unable to offer a  bed today. Hospital Liaison will follow up tomorrow or sooner if a room becomes available. TOC Velva Harman made aware.   Please do not hesitate to call with questions.    Thank you,   Gar Ponto, RN   [facility] Liaison (ln [facility] [place])   [phone]   [phone]

## 2022-10-11 NOTE — TOC Progression Note (Signed)
Transition of Care [facility]) - Progression Note    Patient Details  Name: Alaysia Lightle MRN: [id number] Date of Birth: [birth year]  Transition of Care [facility]) CM/SW Contact  Leeroy Cha, RN Phone Number: 10/11/2022, 10:10 AM  Clinical Narrative:     [facility] at [facility] no beds at this time will keep apprized of the situation.   Expected Discharge Plan: [place] Barriers to Discharge: Continued Medical Work up  Expected Discharge Plan and Services Expected Discharge Plan: [place]   Discharge Planning Services: CM Consult Post Acute Care Choice: [place] arrangements for the past 2 months: Single Family Home                                       Social Determinants of Health (SDOH) Interventions    Readmission Risk Interventions   No data to display

## 2022-10-11 NOTE — Progress Notes (Signed)
I do believe that Ms. Siegrist does have pancreatic cancer.  Her CA 19-9 came back at 5000.  Normal is less than 30.  She will have her IVC filter placed.  This is that she does not need to be on anticoagulation.  Her hemoglobin is 7.5.  Her white cell count is 13.8.  Her platelet count is 276,000.  The bilirubin is 10 now.  Her albumin is 2.  The BUN is 27 creatinine 1.95.  Blood sugar is 239.  Again, our goal is comfort care.  She is a great candidate for [facility].  Hopefully, she will be able to be moved over there this week.  I am not sure when a bed will be available.  She does have a little bit of discomfort.  He is on pain medication for this.  I think that Roxanol elixir would be perfect for her.  Again, our goal is comfort care.  I just want her to have respect, dignity and comfort.  I know that [facility] will do a great job with her.  We will continue follow along.  Lattie Haw, MD  2 Cor 5:7

## 2022-10-11 NOTE — Plan of Care (Signed)
  Problem: Coping: Goal: Ability to adjust to condition or change in health will improve Outcome: Progressing   Problem: Pain Managment: Goal: General experience of comfort will improve Outcome: Progressing   Problem: Safety: Goal: Ability to remain free from injury will improve Outcome: Progressing

## 2022-10-11 NOTE — Progress Notes (Signed)
   Palliative Medicine Inpatient Follow Up Note     Chart Reviewed. Patient assessed at the bedside. Resting comfortably. Her daughter is at the bedside. Patient had IVC filter placed today.   Care is now comfort focused. She is jaundiced. I created space and opportunity for patient and family to express feelings and thoughts. She is awaiting available bed at [facility]. She and her daughter are getting some final affairs in order today. Emotional support provided.   Education provided on comfort focused care and end-of-life expectations. Patient and daughter appreciative of the care and ongoing support.   Questions addressed and support provided.    Objective Assessment: Vital Signs Vitals:   10/11/22 1346 10/11/22 1417  BP: (!) 113/53 (!) 120/54  Pulse: (!) 106 (!) 104  Resp: 16 17  Temp: (!) 97.5 F (36.4 C) 97.9 F (36.6 C)  SpO2: 95% 94%   No intake or output data in the 24 hours ending 10/11/22 1517 Last Weight  Most recent update: 10/07/2022  9:52 AM    Weight  77.1 kg (170 lb)            Gen:  NAD, weak, ill-appearing, jaundice  HEENT: moist mucous membranes CV: Regular rate and rhythm, no murmurs rubs or gallops PULM: clear to auscultation bilaterally. No wheezes/rales/rhonchi ABD: soft/tender/distended/normal bowel sounds Neuro: Alert and oriented x3  SUMMARY OF RECOMMENDATIONS   Comfort focused care Pending transfer to [facility] once bed is available PMT will continue to support and follow on as needed basis. Please secure chat for urgent needs.    Time Total: 40 min   Visit consisted of counseling and education dealing with the complex and emotionally intense issues of symptom management and palliative care in the setting of serious and potentially life-threatening illness.Greater than 50%  of this time was spent counseling and coordinating care related to the above assessment and plan.  Alda Lea, AGPCNP-BC  Palliative Medicine  Team [phone]  Palliative Medicine Team providers are available by phone from 7am to 7pm daily and can be reached through the team cell phone. Should this patient require assistance outside of these hours, please call the patient's attending physician.

## 2022-10-12 LAB — TYPE AND SCREEN
ABO/RH(D): A POS
Antibody Screen: NEGATIVE
Unit division: 0

## 2022-10-12 LAB — BPAM RBC
Blood Product Expiration Date: 202311262359
Unit Type and Rh: 6200

## 2022-10-12 MED ORDER — CALCIUM CARBONATE ANTACID 500 MG PO CHEW
1.0000 | CHEWABLE_TABLET | Freq: Four times a day (QID) | ORAL | Status: DC | PRN
  Filled 2022-10-12: qty 2

## 2022-10-12 NOTE — Progress Notes (Signed)
[name] had the IVC filter placed yesterday.  Everything went well.  We are still awaiting a bed to be open at [facility].  Hopefully, this will happen in the next day or so.  She is really not eating much.  She is incredibly weak.  She does not complain of any pain.  She has 3 peripheral IVs in.  I think we can get out 2 of these at least.  She has had no obvious diarrhea.  There is been no bleeding.  There is no cough or shortness of breath.  Her vital signs are temperature 97.8.  Pulse 110.  Blood pressure 136/62.  Her lungs sound clear bilaterally.  She has decent air movement bilaterally.  Cardiac exam is tachycardic but regular.  She may have an occasional extra beat.  Her abdomen is somewhat distended.  Bowel sounds are present.  There is no guarding or rebound tenderness.  Extremities show some mild edema in the legs bilaterally.  Neurological exam is nonfocal.  Skin exam does show some jaundice.  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  She is not a candidate for treatment.  Her prognosis is probably less than 2 weeks.  She has obstructive jaundice.  She has thromboembolic disease.  Again, [facility] is the ideal facility for her.  Again I know that they are always full.  We will just have to wait for a bed to become available.  I do appreciate everybody's help with Ms. Dziuba.  She is very charming.  Again we have to get out of a couple of these IVs in her arms.  She really does not need these.  Lattie Haw, MD  Hebrews 4:16

## 2022-10-12 NOTE — Plan of Care (Signed)
  Problem: Coping: Goal: Level of anxiety will decrease Outcome: Progressing   Problem: Skin Integrity: Goal: Risk for impaired skin integrity will decrease Outcome: Progressing   

## 2022-10-12 NOTE — Progress Notes (Signed)
Informed pt daughter, Merrit Waugh, pt being transferred to 1302.

## 2022-10-12 NOTE — Progress Notes (Signed)
Manufacturing engineer [facility]) Hospital Liaison note   Update on this [place] referral: Unfortunately, [facility] [place] is unable to offer a  bed today. Hospital Liaison will follow up tomorrow or sooner if a room becomes available.    Please do not hesitate to call with questions.    Thank you,  Venia Carbon DNP, RN [facility] Liaison

## 2022-10-12 NOTE — Progress Notes (Signed)
PROGRESS NOTE    Beth Garrison  VHQ:[id number] DOB: November 01, 1941 DOA: 10/07/2022 PCP: Mindi Curling, PA-C  Brief Narrative: This 81 y.o. female with PMH significant of  CKD, stage IIIa , hyperkalemia, history of melena, history of sepsis, type II DM, GERD, hyperlipidemia, hypertension, intractable nausea and vomiting, unspecified anemia who is brought via EMS due to abdominal pain, decreased appetite, nausea, weight loss and generalized weakness for the past 2 weeks.  CT abdomen/pelvis with bilateral lower extremity DVT and suspicion for right lower lobe segmental pulmonary emboli.  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  Ill-defined mass in the uncinate process of the pancreas.  Associated pancreatic ductal dilatation, diffuse pancreatic atrophy and significant dilatation of the CBD and gallbladder. GI and IR, oncology  Consulted for recommendations.  After further discussions with family , GI, daughter and patient wanted to transition to comfort measures. Discussed with palliative, TOC consulted for hospice and discontinue the biopsy and other invasive tests.  She underwent IVC filter placement yesterday.  Plan for or discharged to [facility] tomorrow.   Assessment & Plan:   Principal Problem:   Hyperbilirubinemia Active Problems:   Type II diabetes mellitus with manifestations (HCC)   Hyperlipidemia with target LDL less than 100   HTN (hypertension)   AKI (acute kidney injury) (HCC)   Normocytic anemia   Stage 3b chronic kidney disease (CKD) (HCC)   Hyponatremia   Pancreatic mass   DVT, bilateral lower limbs (HCC)  Pancreatic mass with hyperbilirubinemia: Biliary obstruction, peritoneal carcinomatosis and malignant ascites GI was consulted, patient may require ERCP and biopsy. IR was consulted for diagnostic paracentesis with cytology and possible omental biopsy  Continue adequate pain control. Palliative care consulted for goals of care discussion.  After  further discussions with family , GI, daughter and patient wanted to transition to comfort measures.  Discussed with palliative, discontinue the biopsy and other invasive tests. Plan: Care transitioned to full comfort care. Anticipated discharge to [facility] tomorrow.   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    Bilateral DVT and Suspected PE: On Heparin initially, s/p IVC filter placement by IR yesterday. Care transitioned to full comfort care.  AKI on  CKD stage IIIa Creatinine at baseline between 1. 4 to 1.6.  Continue IV hydration.  Renal functions improving. Care transitioned to comfort care.   Hypertension:  Blood pressure remains controlled.   Type 2 DM:  FS remains  controlled.   Hyperlipidemia:    Anemia of chronic disease:  Transfuse to keep hemoglobin greater than 7.    Leukocytosis:  Suspect reactive.  Empirically  on IV antibiotics which were discontinued.    Anemia of chronic disease/ anemia of malignancy;  Hemoglobin 7.5 S/p 1 unit of prbc transfusion.   DVT prophylaxis: Status post IVC filter. Code Status: DNR Family Communication: No family at bedside Disposition Plan:   Status is: Inpatient Remains inpatient appropriate because: Admitted for pancreatic mass causing biliary obstruction, also found to have bilateral DVT and PE underwent IVC filter placement.  Plan is to discharge to [facility] tomorrow.  Care transitioned to full comfort care   Consultants:  Oncology Palliative care  Procedures: IVC filter placement Antimicrobials:  Anti-infectives (From admission, onward)    Start     Dose/Rate Route Frequency Ordered Stop   10/07/22 2000  piperacillin-tazobactam (ZOSYN) IVPB 3.375 g  Status:  Discontinued        3.375 g 12.5 mL/hr over 240 Minutes Intravenous Every  8 hours 10/07/22 1856 10/09/22 1708       Subjective: Patient was seen and examined at bedside.  Overnight events noted.   Patient lies  comfortably,  appears yellow, reports pain is reasonably controlled She is awaiting transfer to [facility] for comfort care.  Objective: Vitals:   10/11/22 1251 10/11/22 1346 10/11/22 1417 10/12/22 0512  BP: (!) 118/58 (!) 113/53 (!) 120/54 136/62  Pulse: (!) 105 (!) 106 (!) 104 (!) 110  Resp: '16 16 17 16  '$ Temp: 98.3 F (36.8 C) (!) 97.5 F (36.4 C) 97.9 F (36.6 C) 97.8 F (36.6 C)  TempSrc: Oral Oral Oral Oral  SpO2: 93% 95% 94% 97%  Weight:      Height:        Intake/Output Summary (Last 24 hours) at 10/12/2022 1211 Last data filed at 10/12/2022 1000 Gross per 24 hour  Intake 180 ml  Output 200 ml  Net -20 ml   Filed Weights   10/07/22 0951  Weight: 77.1 kg    Examination:  General exam: Appears comfortable, deconditioned, not in any distress,  appears yellow. Respiratory system: CTA bilaterally,  respiratory effort normal, RR 14. Cardiovascular system: S1 & S2 heard, regular rate and rhythm, no murmur. Gastrointestinal system: Abdomen is soft, mildly tender, non distended, BS+ Central nervous system: Alert and oriented x 3. No focal neurological deficits. Extremities: No edema, no cyanosis, no clubbing. Skin: No rashes, lesions or ulcers Psychiatry: Judgement and insight appear normal. Mood & affect appropriate.     Data Reviewed: I have personally reviewed following labs and imaging studies  CBC: Recent Labs  Lab 10/07/22 0939 10/07/22 2331 10/08/22 0445 10/08/22 0957 10/08/22 1845 10/09/22 0742 10/09/22 0800 10/09/22 2102 10/10/22 0552 10/11/22 0530  WBC 14.5* 14.9*  --  13.7*  --   --  10.6*  --  11.8* 13.8*  NEUTROABS 11.7*  --   --   --   --   --   --   --   --   --   HGB 9.5* 7.6*   < > 8.0*  --   --  6.4* 7.0* 6.9* 7.5*  HCT 30.4* 24.0*   < > 25.4*  --   --  20.2* 22.3* 22.2* 24.3*  MCV 92.1 91.6  --  92.4  --   --  92.2  --  93.7 94.9  PLT 233 206  --  224 215 204 209  --  229 276   < > = values in this interval not displayed.   Basic  Metabolic Panel: Recent Labs  Lab 10/07/22 0939 10/07/22 2331 10/09/22 0742 10/10/22 0552 10/11/22 0530  NA 132* 135 135 134* 136  K 4.0 4.6 4.2 4.2 4.2  CL 105 108 106 106 104  CO2 17* 18* 21* 21* 22  GLUCOSE 116* 128* 188* 168* 239*  BUN 34* 31* 28* 24* 27*  CREATININE 2.20* 2.04* 1.85* 1.74* 1.95*  CALCIUM 7.9* 7.7* 7.9* 7.9* 8.2*   GFR: Estimated Creatinine Clearance: 23.6 mL/min (A) (by C-G formula based on SCr of 1.95 mg/dL (H)). Liver Function Tests: Recent Labs  Lab 10/07/22 0939 10/07/22 2331 10/09/22 0742 10/10/22 0552 10/11/22 0530  AST 265* 196* 130* 131* 130*  ALT 131* 104* 78* 75* 84*  ALKPHOS 3,364* 3,286* 2,067* 1,865* 1,999*  BILITOT 9.4* 8.0* 8.7* 9.7* 9.9*  PROT 5.6* 4.4* 4.8* 4.7* 4.7*  ALBUMIN 2.3* 1.9* 2.2* 2.1* 2.0*   Recent Labs  Lab 10/07/22 0939  LIPASE 19  No results for input(s): "AMMONIA" in the last 168 hours. Coagulation Profile: Recent Labs  Lab 10/07/22 1345 10/07/22 2331 10/08/22 0957 10/08/22 1845 10/09/22 0742  INR 4.0* 7.2* 7.5* 6.4* 1.7*   Cardiac Enzymes: No results for input(s): "CKTOTAL", "CKMB", "CKMBINDEX", "TROPONINI" in the last 168 hours. BNP (last 3 results) No results for input(s): "PROBNP" in the last 8760 hours. HbA1C: No results for input(s): "HGBA1C" in the last 72 hours. CBG: Recent Labs  Lab 10/08/22 1155 10/08/22 1651 10/08/22 2010 10/09/22 0717 10/09/22 1144  GLUCAP 89 186* 246* 150* 152*   Lipid Profile: No results for input(s): "CHOL", "HDL", "LDLCALC", "TRIG", "CHOLHDL", "LDLDIRECT" in the last 72 hours. Thyroid Function Tests: No results for input(s): "TSH", "T4TOTAL", "FREET4", "T3FREE", "THYROIDAB" in the last 72 hours. Anemia Panel: No results for input(s): "VITAMINB12", "FOLATE", "FERRITIN", "TIBC", "IRON", "RETICCTPCT" in the last 72 hours. Sepsis Labs: Recent Labs  Lab 10/07/22 0939 10/07/22 1221  LATICACIDVEN 1.8 1.2    No results found for this or any previous visit  (from the past 240 hour(s)).   Radiology Studies: IR IVC FILTER PLMT / S&I Burke Keels GUID/MOD SED  Result Date: 10/11/2022 INDICATION: 81 year old woman with bilateral lower extremity DVT cannot be anticoagulated due to anemia. IR consulted for IVC filter placement EXAM: IVC filter placement MEDICATIONS: None. ANESTHESIA/SEDATION: Moderate (conscious) sedation was employed during this procedure. A total of Versed 0.5 mg and Fentanyl 25 mcg was administered intravenously by the radiology nurse. Total intra-service moderate Sedation Time: 28 minutes. The patient's level of consciousness and vital signs were monitored continuously by radiology nursing throughout the procedure under my direct supervision. FLUOROSCOPY: Radiation Exposure Index (as provided by the fluoroscopic device): 106 mGy Kerma COMPLICATIONS: None immediate. PROCEDURE: Informed written consent was obtained from the patient after a thorough discussion of the procedural risks, benefits and alternatives. All questions were addressed. Maximal Sterile Barrier Technique was utilized including caps, mask, sterile gowns, sterile gloves, sterile drape, hand hygiene and skin antiseptic. A timeout was performed prior to the initiation of the procedure. Patient positioned supine on the angiography table. Right neck prepped and draped in usual sterile fashion. All elements of maximal sterile barrier were utilized including, cap, mask, sterile gown, sterile gloves, large sterile drape, hand scrubbing and 2% Chlorhexidine for skin cleaning. The right internal jugular vein was evaluated with ultrasound and shown to be patent. A permanent ultrasound image was obtained and placed in the patient's medical record. Using sterile gel and a sterile probe cover, the right internal jugular vein was entered with a 21 ga needle during real time ultrasound guidance. 21 gauge needle exchanged for a transitional dilator set over 0.018 inch guidewire. Transitional dilator set  exchanged for 10 fr sheath over 0.035 inch guidewire. Sheath placed to the infrarenal IVC and CO2 venogram performed to delineate the position of the renal veins. The IVC and renal veins could not be well visualized with CO2 injection. Small amount of contrast administered through the sheath showed that the tip was located in a small collateral vein alongside the IVC. The sheath was advanced into the IVC and venogram was performed with 10 mL of contrast. This delineated the position of the renal veins. Retrievable Denali IVC filter deployed in the infrarenal location. Sheath removed and hemostasis achieved with manual compression. IMPRESSION: Successful insertion of retrievable Denali IVC filter in infrarenal location PLAN: This IVC filter is potentially retrievable. The patient will be assessed for filter retrieval by Interventional Radiology in approximately 8-12 weeks. Further recommendations regarding  filter retrieval, continued surveillance or declaration of device permanence, will be made at that time. Electronically Signed   By: Miachel Roux M.D.   On: 10/11/2022 13:04    Scheduled Meds:  sodium chloride   Intravenous Once   Continuous Infusions:   LOS: 5 days    Time spent: 35 mins    Valeriano Bain, MD [facility]   If 7PM-7AM, please contact night-coverage

## 2022-10-12 NOTE — TOC Progression Note (Signed)
Transition of Care [facility]) - Progression Note    Patient Details  Name: Beth Garrison MRN: [id number] Date of Birth: 06-16-1941  [facility]) CM/SW Contact  Chamara Dyck, Juliann Pulse, RN Phone Number: 10/12/2022, 9:59 AM  Clinical Narrative: [facility] rep has no beds for [facility] today.      Expected Discharge Plan: [place] Barriers to Discharge: Continued Medical Work up  Expected Discharge Plan and Services Expected Discharge Plan: [place]   Discharge Planning Services: CM Consult Post Acute Care Choice: [place] arrangements for the past 2 months: Single Family Home                                       Social Determinants of Health (SDOH) Interventions    Readmission Risk Interventions     No data to display

## 2022-10-13 NOTE — Progress Notes (Signed)
PROGRESS NOTE    Beth Garrison  QQP:[id number] DOB: 1941-05-18 DOA: 10/07/2022  PCP: Mindi Curling, PA-C  Brief Narrative: This 81 y.o. female with PMH significant of  CKD, stage IIIa , hyperkalemia, history of melena, history of sepsis, type II DM, GERD, hyperlipidemia, hypertension, intractable nausea and vomiting, unspecified anemia who is brought via EMS due to abdominal pain, decreased appetite, nausea, weight loss and generalized weakness for the past 2 weeks.  CT abdomen/pelvis with bilateral lower extremity DVT and suspicion for right lower lobe segmental pulmonary emboli.  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  Ill-defined mass in the uncinate process of the pancreas.  Associated pancreatic ductal dilatation, diffuse pancreatic atrophy and significant dilatation of the CBD and gallbladder. GI and IR, oncology  Consulted for recommendations.  After further discussions with family , GI, daughter and patient wanted to transition to comfort measures. Discussed with palliative, TOC consulted for hospice and discontinue the biopsy and other invasive tests.  She underwent IVC filter placement 11/08.  Plan for discharge to [facility] tomorrow or sooner when bed available.   Assessment & Plan:   Principal Problem:   Hyperbilirubinemia Active Problems:   Type II diabetes mellitus with manifestations (HCC)   Hyperlipidemia with target LDL less than 100   HTN (hypertension)   AKI (acute kidney injury) (HCC)   Normocytic anemia   Stage 3b chronic kidney disease (CKD) (HCC)   Hyponatremia   Pancreatic mass   DVT, bilateral lower limbs (HCC)  Pancreatic mass with hyperbilirubinemia: Biliary obstruction, peritoneal carcinomatosis and malignant ascites GI was consulted, patient may require ERCP and biopsy. IR was consulted for diagnostic paracentesis with cytology and possible omental biopsy  Continue adequate pain control. Palliative care consulted for goals of care  discussion.  After further discussions with family, GI, daughter , Care transitioned to  full comfort measures.  Discussed with palliative, discontinue the biopsy and other invasive tests. Plan: Care transitioned to full comfort care. Anticipated discharge to [facility] tomorrow or sooner when bed available.   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    Bilateral DVT and Suspected PE: On Heparin initially, s/p IVC filter placement by IR 10/11/22. Care transitioned to full comfort care.  AKI on  CKD stage IIIa Creatinine at baseline between 1. 4 to 1.6.  Continue IV hydration.  Renal functions improving. Care transitioned to full comfort care.   Hypertension:  Blood pressure remains controlled.   Type 2 DM:  FS remains  controlled.   Hyperlipidemia:  Care transitioned to comfort care.   Anemia of chronic disease:  Transfuse to keep hemoglobin greater than 7.    Leukocytosis:  Suspect reactive.  Empirically  on IV antibiotics which were discontinued.    Anemia of chronic disease/ anemia of malignancy;  Hemoglobin 7.5 S/p 1 unit of prbc transfusion.   DVT prophylaxis: Status post IVC filter. Code Status: DNR Family Communication: No family at bedside Disposition Plan:   Status is: Inpatient Remains inpatient appropriate because: Admitted for pancreatic mass causing biliary obstruction, also found to have bilateral DVT and PE underwent IVC filter placement.  Plan is to discharge to [facility] tomorrow.  Care transitioned to full comfort care   Consultants:  Oncology Palliative care  Procedures: IVC filter placement Antimicrobials:  Anti-infectives (From admission, onward)    Start     Dose/Rate Route Frequency Ordered Stop   10/07/22 2000  piperacillin-tazobactam (ZOSYN) IVPB 3.375 g  Status:  Discontinued  3.375 g 12.5 mL/hr over 240 Minutes Intravenous Every 8 hours 10/07/22 1856 10/09/22 1708        Subjective: Patient was seen and examined at bedside.  Overnight events noted.   Patient lies comfortably, appears less yellow than yesterday, reports pain is reasonably controlled. She is awaiting transfer to [facility] for comfort care.  Objective: Vitals:   10/11/22 1417 10/12/22 0512 10/13/22 0558 10/13/22 0827  BP: (!) 120/54 136/62 (!) 135/45 (!) 127/53  Pulse: (!) 104 (!) 110 (!) 105 (!) 107  Resp: '17 16 16 16  '$ Temp: 97.9 F (36.6 C) 97.8 F (36.6 C)  97.8 F (36.6 C)  TempSrc: Oral Oral    SpO2: 94% 97% 99% 97%  Weight:      Height:        Intake/Output Summary (Last 24 hours) at 10/13/2022 1119 Last data filed at 10/13/2022 0835 Gross per 24 hour  Intake 60 ml  Output 51 ml  Net 9 ml   Filed Weights   10/07/22 0951  Weight: 77.1 kg    Examination:  General exam: Appears comfortable, deconditioned, chronically ill looking, appears yellow, NAD Respiratory system: CTA bilaterally, respiratory effort normal, RR 16. Cardiovascular system: S1 & S2 heard, regular rate and rhythm, no murmur. Gastrointestinal system: Abdomen is soft, mildly tender, non distended, BS+ Central nervous system: Alert and oriented x 3. No focal neurological deficits. Extremities: No edema, no cyanosis, no clubbing. Skin: No rashes, lesions or ulcers Psychiatry: Judgement and insight appear normal. Mood & affect appropriate.     Data Reviewed: I have personally reviewed following labs and imaging studies  CBC: Recent Labs  Lab 10/07/22 0939 10/07/22 2331 10/08/22 0445 10/08/22 0957 10/08/22 1845 10/09/22 0742 10/09/22 0800 10/09/22 2102 10/10/22 0552 10/11/22 0530  WBC 14.5* 14.9*  --  13.7*  --   --  10.6*  --  11.8* 13.8*  NEUTROABS 11.7*  --   --   --   --   --   --   --   --   --   HGB 9.5* 7.6*   < > 8.0*  --   --  6.4* 7.0* 6.9* 7.5*  HCT 30.4* 24.0*   < > 25.4*  --   --  20.2* 22.3* 22.2* 24.3*  MCV 92.1 91.6  --  92.4  --   --  92.2  --  93.7 94.9  PLT 233 206   --  224 215 204 209  --  229 276   < > = values in this interval not displayed.   Basic Metabolic Panel: Recent Labs  Lab 10/07/22 0939 10/07/22 2331 10/09/22 0742 10/10/22 0552 10/11/22 0530  NA 132* 135 135 134* 136  K 4.0 4.6 4.2 4.2 4.2  CL 105 108 106 106 104  CO2 17* 18* 21* 21* 22  GLUCOSE 116* 128* 188* 168* 239*  BUN 34* 31* 28* 24* 27*  CREATININE 2.20* 2.04* 1.85* 1.74* 1.95*  CALCIUM 7.9* 7.7* 7.9* 7.9* 8.2*   GFR: Estimated Creatinine Clearance: 23.6 mL/min (A) (by C-G formula based on SCr of 1.95 mg/dL (H)). Liver Function Tests: Recent Labs  Lab 10/07/22 0939 10/07/22 2331 10/09/22 0742 10/10/22 0552 10/11/22 0530  AST 265* 196* 130* 131* 130*  ALT 131* 104* 78* 75* 84*  ALKPHOS 3,364* 3,286* 2,067* 1,865* 1,999*  BILITOT 9.4* 8.0* 8.7* 9.7* 9.9*  PROT 5.6* 4.4* 4.8* 4.7* 4.7*  ALBUMIN 2.3* 1.9* 2.2* 2.1* 2.0*   Recent Labs  Lab 10/07/22  4481  LIPASE 19   No results for input(s): "AMMONIA" in the last 168 hours. Coagulation Profile: Recent Labs  Lab 10/07/22 1345 10/07/22 2331 10/08/22 0957 10/08/22 1845 10/09/22 0742  INR 4.0* 7.2* 7.5* 6.4* 1.7*   Cardiac Enzymes: No results for input(s): "CKTOTAL", "CKMB", "CKMBINDEX", "TROPONINI" in the last 168 hours. BNP (last 3 results) No results for input(s): "PROBNP" in the last 8760 hours. HbA1C: No results for input(s): "HGBA1C" in the last 72 hours. CBG: Recent Labs  Lab 10/08/22 1155 10/08/22 1651 10/08/22 2010 10/09/22 0717 10/09/22 1144  GLUCAP 89 186* 246* 150* 152*   Lipid Profile: No results for input(s): "CHOL", "HDL", "LDLCALC", "TRIG", "CHOLHDL", "LDLDIRECT" in the last 72 hours. Thyroid Function Tests: No results for input(s): "TSH", "T4TOTAL", "FREET4", "T3FREE", "THYROIDAB" in the last 72 hours. Anemia Panel: No results for input(s): "VITAMINB12", "FOLATE", "FERRITIN", "TIBC", "IRON", "RETICCTPCT" in the last 72 hours. Sepsis Labs: Recent Labs  Lab 10/07/22 0939  10/07/22 1221  LATICACIDVEN 1.8 1.2    No results found for this or any previous visit (from the past 240 hour(s)).   Radiology Studies: IR IVC FILTER PLMT / S&I Burke Keels GUID/MOD SED  Result Date: 10/11/2022 INDICATION: 81 year old woman with bilateral lower extremity DVT cannot be anticoagulated due to anemia. IR consulted for IVC filter placement EXAM: IVC filter placement MEDICATIONS: None. ANESTHESIA/SEDATION: Moderate (conscious) sedation was employed during this procedure. A total of Versed 0.5 mg and Fentanyl 25 mcg was administered intravenously by the radiology nurse. Total intra-service moderate Sedation Time: 28 minutes. The patient's level of consciousness and vital signs were monitored continuously by radiology nursing throughout the procedure under my direct supervision. FLUOROSCOPY: Radiation Exposure Index (as provided by the fluoroscopic device): 856 mGy Kerma COMPLICATIONS: None immediate. PROCEDURE: Informed written consent was obtained from the patient after a thorough discussion of the procedural risks, benefits and alternatives. All questions were addressed. Maximal Sterile Barrier Technique was utilized including caps, mask, sterile gowns, sterile gloves, sterile drape, hand hygiene and skin antiseptic. A timeout was performed prior to the initiation of the procedure. Patient positioned supine on the angiography table. Right neck prepped and draped in usual sterile fashion. All elements of maximal sterile barrier were utilized including, cap, mask, sterile gown, sterile gloves, large sterile drape, hand scrubbing and 2% Chlorhexidine for skin cleaning. The right internal jugular vein was evaluated with ultrasound and shown to be patent. A permanent ultrasound image was obtained and placed in the patient's medical record. Using sterile gel and a sterile probe cover, the right internal jugular vein was entered with a 21 ga needle during real time ultrasound guidance. 21 gauge needle  exchanged for a transitional dilator set over 0.018 inch guidewire. Transitional dilator set exchanged for 10 fr sheath over 0.035 inch guidewire. Sheath placed to the infrarenal IVC and CO2 venogram performed to delineate the position of the renal veins. The IVC and renal veins could not be well visualized with CO2 injection. Small amount of contrast administered through the sheath showed that the tip was located in a small collateral vein alongside the IVC. The sheath was advanced into the IVC and venogram was performed with 10 mL of contrast. This delineated the position of the renal veins. Retrievable Denali IVC filter deployed in the infrarenal location. Sheath removed and hemostasis achieved with manual compression. IMPRESSION: Successful insertion of retrievable Denali IVC filter in infrarenal location PLAN: This IVC filter is potentially retrievable. The patient will be assessed for filter retrieval by Interventional Radiology in  approximately 8-12 weeks. Further recommendations regarding filter retrieval, continued surveillance or declaration of device permanence, will be made at that time. Electronically Signed   By: Miachel Roux M.D.   On: 10/11/2022 13:04    Scheduled Meds:  sodium chloride   Intravenous Once   Continuous Infusions:   LOS: 6 days    Time spent: 35 mins    Leaann Nevils, MD [facility]   If 7PM-7AM, please contact night-coverage

## 2022-10-13 NOTE — Progress Notes (Signed)
Manufacturing engineer [facility]) Hospital Liaison note   Update on this [facility] referral: Unfortunately, [facility] is unable to offer a  bed today. Hospital Liaison will follow up tomorrow or sooner if a room becomes available.    Please do not hesitate to call with questions.    Thank you,  Venia Carbon DNP, RN [facility] Liaison

## 2022-10-14 MED ORDER — ONDANSETRON HCL 4 MG PO TABS: 4.0000 mg | ORAL_TABLET | Freq: Four times a day (QID) | ORAL | 0 refills | Status: AC | PRN

## 2022-10-14 NOTE — Discharge Summary (Signed)
Physician Discharge Summary  Beth Garrison PFX:[id number] DOB: 05/25/1941 DOA: 10/07/2022  PCP: Mindi Curling, PA-C  Admit date: 10/07/2022  Discharge date: 10/16/22  Admitted From: Home Disposition: [facility].  Recommendations for Outpatient Follow-up:  Follow up with PCP in 1-2 weeks Please obtain BMP/CBC in one week Patient is being discharged to [facility] for comfort care.  Home Health: Comfort care Equipment/Devices:Home oxygen  Discharge Condition: Stable CODE STATUS:DNR Diet recommendation: Pureed diet  Brief/Interim Summary:  This 81 y.o. female with PMH significant of  CKD, stage IIIa , hyperkalemia, history of melena, history of sepsis, type II DM, GERD, hyperlipidemia, hypertension, intractable nausea and vomiting, unspecified anemia who is brought via EMS due to abdominal pain, decreased appetite, nausea, weight loss and generalized weakness for the past 2 weeks.  CT abdomen/pelvis with bilateral lower extremity DVT and suspicion for right lower lobe segmental pulmonary emboli.  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  Ill-defined mass in the uncinate process of the pancreas.  Associated pancreatic ductal dilatation, diffuse pancreatic atrophy and significant dilatation of the CBD and gallbladder. GI and IR, oncology  Consulted for recommendations.  After further discussions with family , GI, daughter and patient wanted to transition to comfort measures. Discussed with palliative, TOC consulted for hospice and discontinue the biopsy and other invasive tests.  She underwent IVC filter placement 11/08.  Patient is being discharged to [facility] for comfort care.   Discharge Diagnoses:  Principal Problem:   Hyperbilirubinemia Active Problems:   Type II diabetes mellitus with manifestations (HCC)   Hyperlipidemia with target LDL less than 100   HTN (hypertension)   AKI (acute kidney injury) (HCC)   Normocytic anemia   Stage 3b chronic kidney  disease (CKD) (HCC)   Hyponatremia   Pancreatic mass   DVT, bilateral lower limbs (HCC)  Pancreatic mass with hyperbilirubinemia: Biliary obstruction, peritoneal carcinomatosis and malignant ascites GI was consulted, patient may require ERCP and biopsy. IR was consulted for diagnostic paracentesis with cytology and possible omental biopsy  Continue adequate pain control. Palliative care consulted for goals of care discussion.  After further discussions with family, GI, daughter , Care transitioned to  full comfort measures.  Discussed with palliative, discontinue the biopsy and other invasive tests. Plan: Care transitioned to full comfort care. Patient is being discharged to [facility] for comfort care.   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    Bilateral DVT and Suspected PE: On Heparin initially, s/p IVC filter placement by IR 10/11/22. Care transitioned to full comfort care.   AKI on  CKD stage IIIa Creatinine at baseline between 1. 4 to 1.6.  Continue IV hydration.  Renal functions improving. Care transitioned to full comfort care.   Hypertension:  Blood pressure remains controlled.   Type 2 DM:  FS remains  controlled.   Hyperlipidemia:  Care transitioned to comfort care.   Anemia of chronic disease:  Transfuse to keep hemoglobin greater than 7.    Leukocytosis:  Suspect reactive.  Empirically  on IV antibiotics which were discontinued.    Anemia of chronic disease/ anemia of malignancy;  Hemoglobin 7.5 S/p 1 unit of prbc transfusion.  Discharge Instructions  Discharge Instructions     Call MD for:  difficulty breathing, headache or visual disturbances   Complete by: As directed    Call MD for:  persistant dizziness or light-headedness   Complete by: As directed    Call MD for:  persistant nausea and  vomiting   Complete by: As directed    Diet - low sodium heart healthy   Complete by: As directed    Diet - low sodium  heart healthy   Complete by: As directed    Diet Carb Modified   Complete by: As directed    Discharge instructions   Complete by: As directed    Patient is being discharged to [facility] for comfort care.   Discharge wound care:   Complete by: As directed    Follow up wound care in [facility].   Increase activity slowly   Complete by: As directed    No wound care   Complete by: As directed       Allergies as of 10/16/2022       Reactions   Dulaglutide Diarrhea   Gabapentin Diarrhea        Medication List     STOP taking these medications    diclofenac Sodium 1 % Gel Commonly known as: Voltaren   ipratropium-albuterol 0.5-2.5 (3) MG/3ML Soln Commonly known as: DUONEB   multivitamin with minerals Tabs tablet   pantoprazole 40 MG tablet Commonly known as: PROTONIX   pravastatin 80 MG tablet Commonly known as: PRAVACHOL   Tresiba FlexTouch 200 UNIT/ML FlexTouch Pen Generic drug: insulin degludec       TAKE these medications    acetaminophen 500 MG tablet Commonly known as: TYLENOL Take 500 mg by mouth every 6 (six) hours as needed for mild pain.   ondansetron 4 MG tablet Commonly known as: ZOFRAN Take 1 tablet (4 mg total) by mouth every 6 (six) hours as needed for nausea.               Discharge Care Instructions  (From admission, onward)           Start     Ordered   10/14/22 0000  Discharge wound care:       Comments: Follow up wound care in [facility].   10/14/22 0919            Follow-up Information     Mindi Curling, PA-C Follow up in 1 week(s).   Specialty: Physician Assistant Contact information: [place] [address] [address] [phone]                Allergies  Allergen Reactions   Dulaglutide Diarrhea   Gabapentin Diarrhea    Consultations: Palliative care   Procedures/Studies: IR IVC FILTER PLMT / S&I Burke Keels GUID/MOD SED  Result Date: 10/11/2022 INDICATION:  81 year old woman with bilateral lower extremity DVT cannot be anticoagulated due to anemia. IR consulted for IVC filter placement EXAM: IVC filter placement MEDICATIONS: None. ANESTHESIA/SEDATION: Moderate (conscious) sedation was employed during this procedure. A total of Versed 0.5 mg and Fentanyl 25 mcg was administered intravenously by the radiology nurse. Total intra-service moderate Sedation Time: 28 minutes. The patient's level of consciousness and vital signs were monitored continuously by radiology nursing throughout the procedure under my direct supervision. FLUOROSCOPY: Radiation Exposure Index (as provided by the fluoroscopic device): 037 mGy Kerma COMPLICATIONS: None immediate. PROCEDURE: Informed written consent was obtained from the patient after a thorough discussion of the procedural risks, benefits and alternatives. All questions were addressed. Maximal Sterile Barrier Technique was utilized including caps, mask, sterile gowns, sterile gloves, sterile drape, hand hygiene and skin antiseptic. A timeout was performed prior to the initiation of the procedure. Patient positioned supine on the angiography table. Right neck prepped and  draped in usual sterile fashion. All elements of maximal sterile barrier were utilized including, cap, mask, sterile gown, sterile gloves, large sterile drape, hand scrubbing and 2% Chlorhexidine for skin cleaning. The right internal jugular vein was evaluated with ultrasound and shown to be patent. A permanent ultrasound image was obtained and placed in the patient's medical record. Using sterile gel and a sterile probe cover, the right internal jugular vein was entered with a 21 ga needle during real time ultrasound guidance. 21 gauge needle exchanged for a transitional dilator set over 0.018 inch guidewire. Transitional dilator set exchanged for 10 fr sheath over 0.035 inch guidewire. Sheath placed to the infrarenal IVC and CO2 venogram performed to delineate the  position of the renal veins. The IVC and renal veins could not be well visualized with CO2 injection. Small amount of contrast administered through the sheath showed that the tip was located in a small collateral vein alongside the IVC. The sheath was advanced into the IVC and venogram was performed with 10 mL of contrast. This delineated the position of the renal veins. Retrievable Denali IVC filter deployed in the infrarenal location. Sheath removed and hemostasis achieved with manual compression. IMPRESSION: Successful insertion of retrievable Denali IVC filter in infrarenal location PLAN: This IVC filter is potentially retrievable. The patient will be assessed for filter retrieval by Interventional Radiology in approximately 8-12 weeks. Further recommendations regarding filter retrieval, continued surveillance or declaration of device permanence, will be made at that time. Electronically Signed   By: Miachel Roux M.D.   On: 10/11/2022 13:04   CT CHEST WO CONTRAST  Result Date: 10/09/2022 CLINICAL DATA:  Brain metastasis. Unknown primary. * Tracking Code: BO * EXAM: CT CHEST WITHOUT CONTRAST TECHNIQUE: Multidetector CT imaging of the chest was performed following the standard protocol without IV contrast. RADIATION DOSE REDUCTION: This exam was performed according to the departmental dose-optimization program which includes automated exposure control, adjustment of the mA and/or kV according to patient size and/or use of iterative reconstruction technique. COMPARISON:  CT 10/07/2022 FINDINGS: Cardiovascular: Coronary artery calcification and aortic atherosclerotic calcification. No acute cardiovascular findings. Mediastinum/Nodes: No axillary or supraclavicular adenopathy. No mediastinal or hilar adenopathy. No pericardial fluid. Esophagus normal. Lungs/Pleura: No suspicious pulmonary nodules. Band of atelectasis in the RIGHT middle lobe appears benign. Upper Abdomen: Moderate volume of free fluid.  Musculoskeletal: No aggressive osseous lesion. IMPRESSION: 1. No pulmonary metastasis or primary pulmonary malignancy. 2. Coronary artery calcification and Aortic Atherosclerosis (ICD10-I70.0). 3. Moderate volume of intraperitoneal free fluid in the upper abdomen Electronically Signed   By: Suzy Bouchard M.D.   On: 10/09/2022 15:40   VAS [place] LOWER EXTREMITY VENOUS (DVT) (7a-7p)  Result Date: 10/07/2022  Lower Venous DVT Study Patient Name:  REGINALD MANGELS  Date of Exam:   10/07/2022 Medical Rec #: [id number]     Accession #:    [id number] Date of Birth: 1941/01/03     Patient Gender: F Patient Age:   62 years Exam Location:  [facility] Procedure:      VAS [place] LOWER EXTREMITY VENOUS (DVT) Referring Phys: Lacretia Leigh --------------------------------------------------------------------------------  Indications: Edema.  Comparison Study: no prior Performing Technologist: Archie Patten RVS  Examination Guidelines: A complete evaluation includes B-mode imaging, spectral Doppler, color Doppler, and power Doppler as needed of all accessible portions of each vessel. Bilateral testing is considered an integral part of a complete examination. Limited examinations for reoccurring indications may be performed as noted. The reflux portion of the exam is performed with  the patient in reverse Trendelenburg.  +---------+---------------+---------+-----------+----------+-----------------+ RIGHT    CompressibilityPhasicitySpontaneityPropertiesThrombus Aging    +---------+---------------+---------+-----------+----------+-----------------+ CFV      Full           Yes      Yes                                    +---------+---------------+---------+-----------+----------+-----------------+ SFJ      Full                                                           +---------+---------------+---------+-----------+----------+-----------------+ FV Prox  None                                         Age  Indeterminate +---------+---------------+---------+-----------+----------+-----------------+ FV Mid   None                                         Age Indeterminate +---------+---------------+---------+-----------+----------+-----------------+ FV DistalNone                                         Age Indeterminate +---------+---------------+---------+-----------+----------+-----------------+ PFV      None                                         Age Indeterminate +---------+---------------+---------+-----------+----------+-----------------+ POP      None           No       No                   Age Indeterminate +---------+---------------+---------+-----------+----------+-----------------+ PTV      None                                         Age Indeterminate +---------+---------------+---------+-----------+----------+-----------------+ PERO     None                                         Age Indeterminate +---------+---------------+---------+-----------+----------+-----------------+   +---------+---------------+---------+-----------+----------+-----------------+ LEFT     CompressibilityPhasicitySpontaneityPropertiesThrombus Aging    +---------+---------------+---------+-----------+----------+-----------------+ CFV      None           No       No                   Age Indeterminate +---------+---------------+---------+-----------+----------+-----------------+ SFJ      None                                         Age Indeterminate +---------+---------------+---------+-----------+----------+-----------------+ FV Prox  None  Age Indeterminate +---------+---------------+---------+-----------+----------+-----------------+ FV Mid   None                                         Age Indeterminate +---------+---------------+---------+-----------+----------+-----------------+ FV DistalNone                                          Age Indeterminate +---------+---------------+---------+-----------+----------+-----------------+ PFV      None                                         Age Indeterminate +---------+---------------+---------+-----------+----------+-----------------+ POP      None           No       No                   Age Indeterminate +---------+---------------+---------+-----------+----------+-----------------+ PTV      None                                         Age Indeterminate +---------+---------------+---------+-----------+----------+-----------------+ PERO     None                                         Age Indeterminate +---------+---------------+---------+-----------+----------+-----------------+ EIV                     No       No                   Age Indeterminate +---------+---------------+---------+-----------+----------+-----------------+     Summary: RIGHT: - Findings consistent with age indeterminate deep vein thrombosis involving the right femoral vein, right proximal profunda vein, right popliteal vein, right posterior tibial veins, and right peroneal veins. - No cystic structure found in the popliteal fossa.  LEFT: - Findings consistent with age indeterminate deep vein thrombosis involving the left common femoral vein, SF junction, left femoral vein, left proximal profunda vein, left popliteal vein, left posterior tibial veins, left peroneal veins, and EIV. - No cystic structure found in the popliteal fossa.  *See table(s) above for measurements and observations. Electronically signed by Orlie Pollen on 10/07/2022 at 1:39:36 PM.    Final    CT Abdomen Pelvis W Contrast  Result Date: 10/07/2022 CLINICAL DATA:  Acute generalized abdominal pain EXAM: CT ABDOMEN AND PELVIS WITH CONTRAST TECHNIQUE: Multidetector CT imaging of the abdomen and pelvis was performed using the standard protocol following bolus administration of intravenous contrast.  RADIATION DOSE REDUCTION: This exam was performed according to the departmental dose-optimization program which includes automated exposure control, adjustment of the mA and/or kV according to patient size and/or use of iterative reconstruction technique. CONTRAST:  146m OMNIPAQUE IOHEXOL 300 MG/ML  SOLN COMPARISON:  None Available. FINDINGS: Lower chest: No acute abnormality. Hepatobiliary: Normal hepatic contour. Subtle 1.1 cm low-attenuation lesion in the anterior hepatic dome (image 9 series 2). Punctate calcifications are present in the posterior aspect of the hepatic dome. These are of uncertain etiology. Mild intrahepatic biliary ductal dilatation. Marked distension of the  gallbladder with perhaps subtle gallbladder wall thickening. The common bile duct is also enlarged proximally at 1.5 cm. The bile duct and tapers to a normal diameter at the pancreatic head. Pancreas: Abnormal appearance of the pancreas. The pancreas is largely atrophied with diffuse dilation of the duct measuring up to 6 mm at the pancreatic head. Ill-defined enhancing nodule in the uncinate process measures approximately 2.0 x 1.4 cm. Spleen: Normal in size without focal abnormality. Adrenals/Urinary Tract: A nonspecific 1.7 cm left adrenal nodule measures 44 Hounsfield units. The right adrenal gland is normal. Mildly atrophic kidneys. Scattered low-attenuation lesions bilaterally are too small for accurate characterization but are statistically highly likely benign cysts. No follow-up imaging recommended. Slight renal size discrepancy with the left kidney smaller than the right. Ureters and bladder are unremarkable. Stomach/Bowel: Small gastric fundal diverticulum. No evidence of bowel obstruction. Normal appendix. No focal mass. Vascular/Lymphatic: Extensive atherosclerotic vascular calcifications throughout the aorta. Suspect significant left renal artery stenosis. No aneurysm or dissection. Suspect pulmonary emboli within partially  imaged segmental right lower lobe PE. Additionally, filling defects are present within the left common femoral and bilateral femoral veins consistent with bilateral lower extremity DVT. Reproductive: Uterus and bilateral adnexa are unremarkable. Other: Diffusely abnormal peritoneum. Extensive micro nodularity throughout the mesentery with more subtle changes in the omentum. Nodular enhancement present along the peritoneal reflections in the pelvic cul-de-sac consistent with implants. There is associated moderate ascites which is almost certainly malignant in nature. No abdominal wall hernia. Musculoskeletal: Grade 1 anterolisthesis of L4 on L5. T9 vertebral body hemangioma. No acute fracture, malalignment or bony lesion. IMPRESSION: 1. Bilateral lower extremity DVT. Recommend dedicated bilateral lower extremity duplex venous ultrasound 2 confirm and assess extent. 2. Suspect right lower lobe segmental pulmonary emboli. CTA of the chest recommended for confirmation. 3. Peritoneal carcinomatosis with malignant ascites. The primary lesion is suspected to be an ill-defined enhancing mass in the uncinate process of the pancreas which may reflect a neuroendocrine neoplasm. There is associated pancreatic ductal dilatation, diffuse pancreatic atrophy and significant dilation of the common bile duct and the gallbladder. Recommend clinical correlation for signs and symptoms of acute cholecystitis given the degree of gallbladder distension. 4. Subtle 1.1 cm low-attenuation lesion in the anterior aspect of the hepatic dome concerning for hepatic metastatic disease. 5. Extensive atherosclerotic vascular disease with calcifications throughout the aorta and branch vessels. Probable hemodynamically significant stenosis of the left renal artery given relative atrophy of the left kidney. 6. Indeterminate left adrenal nodule measuring up to 1.7 cm. This may represent an adenoma or metastatic implant. 7. Grade 1 anterolisthesis of L4  on L5 with associated multilevel degenerative disc disease. 8. Additional ancillary findings as above. Electronically Signed   By: Jacqulynn Cadet M.D.   On: 10/07/2022 10:44   DG Chest Port 1 View  Result Date: 10/07/2022 CLINICAL DATA:  Cough EXAM: PORTABLE CHEST 1 VIEW COMPARISON:  Prior chest x-ray 08/28/2022 FINDINGS: Nonspecific airspace opacity in the periphery of the left lung base. No pneumothorax or pleural effusion. Cardiac and mediastinal contours are unchanged. No acute osseous abnormality. 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 Aortic atherosclerotic vascular calcifications. IMPRESSION: 1. Nonspecific patchy airspace opacity in the periphery of the left lung base. Differential considerations include pneumonia versus atelectasis. Electronically Signed   By: Jacqulynn Cadet M.D.   On: 10/07/2022 10:25     Subjective: Patient was seen and examined at bedside.  Overnight events noted.  Patient seems very comfortable. Patient reports pain is reasonably controlled.  Patient is being discharged to [place] for comfort care.  Discharge Exam: Vitals:   10/15/22 1313 10/16/22 0424  BP: 102/67 (!) 106/58  Pulse: (!) 105 95  Resp: 14 16  Temp: 98 F (36.7 C) 97.8 F (36.6 C)  SpO2: 98% 98%   Vitals:   10/14/22 0501 10/15/22 0507 10/15/22 1313 10/16/22 0424  BP: (!) 116/45 (!) 123/55 102/67 (!) 106/58  Pulse: (!) 105 95 (!) 105 95  Resp: '18 16 14 16  '$ Temp: 98.2 F (36.8 C) 97.7 F (36.5 C) 98 F (36.7 C) 97.8 F (36.6 C)  TempSrc: Oral Oral  Oral  SpO2: 96% 96% 98% 98%  Weight:      Height:        General: Pt is alert, awake, not in acute distress Cardiovascular: RRR, S1/S2 +, no rubs, no gallops Respiratory: CTA bilaterally, no wheezing, no rhonchi Abdominal: Soft, NT, ND, bowel sounds + Extremities: no edema, no cyanosis    The results of significant diagnostics from this hospitalization (including imaging, microbiology, ancillary  and laboratory) are listed below for reference.     Microbiology: No results found for this or any previous visit (from the past 240 hour(s)).   Labs: BNP (last 3 results) Recent Labs    08/23/22 1056 08/23/22 2243  BNP 39.7 32.9   Basic Metabolic Panel: Recent Labs  Lab 10/10/22 0552 10/11/22 0530  NA 134* 136  K 4.2 4.2  CL 106 104  CO2 21* 22  GLUCOSE 168* 239*  BUN 24* 27*  CREATININE 1.74* 1.95*  CALCIUM 7.9* 8.2*   Liver Function Tests: Recent Labs  Lab 10/10/22 0552 10/11/22 0530  AST 131* 130*  ALT 75* 84*  ALKPHOS 1,865* 1,999*  BILITOT 9.7* 9.9*  PROT 4.7* 4.7*  ALBUMIN 2.1* 2.0*   No results for input(s): "LIPASE", "AMYLASE" in the last 168 hours. No results for input(s): "AMMONIA" in the last 168 hours. CBC: Recent Labs  Lab 10/09/22 2102 10/10/22 0552 10/11/22 0530  WBC  --  11.8* 13.8*  HGB 7.0* 6.9* 7.5*  HCT 22.3* 22.2* 24.3*  MCV  --  93.7 94.9  PLT  --  229 276   Cardiac Enzymes: No results for input(s): "CKTOTAL", "CKMB", "CKMBINDEX", "TROPONINI" in the last 168 hours. BNP: Invalid input(s): "POCBNP" CBG: No results for input(s): "GLUCAP" in the last 168 hours.  D-Dimer No results for input(s): "DDIMER" in the last 72 hours. Hgb A1c No results for input(s): "HGBA1C" in the last 72 hours. Lipid Profile No results for input(s): "CHOL", "HDL", "LDLCALC", "TRIG", "CHOLHDL", "LDLDIRECT" in the last 72 hours. Thyroid function studies No results for input(s): "TSH", "T4TOTAL", "T3FREE", "THYROIDAB" in the last 72 hours.  Invalid input(s): "FREET3" Anemia work up No results for input(s): "VITAMINB12", "FOLATE", "FERRITIN", "TIBC", "IRON", "RETICCTPCT" in the last 72 hours. Urinalysis    Component Value Date/Time   COLORURINE AMBER (A) 10/07/2022 0731   APPEARANCEUR HAZY (A) 10/07/2022 0731   LABSPEC >1.046 (H) 10/07/2022 0731   PHURINE 5.0 10/07/2022 0731   GLUCOSEU NEGATIVE 10/07/2022 0731   GLUCOSEU NEGATIVE 12/31/2014 1145    HGBUR NEGATIVE 10/07/2022 0731   BILIRUBINUR NEGATIVE 10/07/2022 0731   KETONESUR NEGATIVE 10/07/2022 0731   PROTEINUR 100 (A) 10/07/2022 0731   UROBILINOGEN 0.2 12/31/2014 1145   NITRITE NEGATIVE 10/07/2022 0731   LEUKOCYTESUR SMALL (A) 10/07/2022 0731   Sepsis Labs Recent Labs  Lab 10/10/22 0552 10/11/22 0530  WBC 11.8* 13.8*   Microbiology No results found for this  or any previous visit (from the past 240 hour(s)).   Time coordinating discharge: Over 30 minutes  SIGNED:   Shawna Clamp, MD  [facility] 10/16/2022, 1:30 PM Pager   If 7PM-7AM, please contact night-coverage

## 2022-10-14 NOTE — Discharge Instructions (Signed)
Patient is being discharged to [place] for comfort care.

## 2022-10-14 NOTE — Progress Notes (Signed)
PROGRESS NOTE    Beth Garrison  FHL:[id number] DOB: 1941-03-11 DOA: 10/07/2022  PCP: Mindi Curling, PA-C  Brief Narrative: This 81 y.o. female with PMH significant of  CKD, stage IIIa , hyperkalemia, history of melena, history of sepsis, type II DM, GERD, hyperlipidemia, hypertension, intractable nausea and vomiting, unspecified anemia who is brought via EMS due to abdominal pain, decreased appetite, nausea, weight loss and generalized weakness for the past 2 weeks.  CT abdomen/pelvis with bilateral lower extremity DVT and suspicion for right lower lobe segmental pulmonary emboli.  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  Ill-defined mass in the uncinate process of the pancreas.  Associated pancreatic ductal dilatation, diffuse pancreatic atrophy and significant dilatation of the CBD and gallbladder. GI and IR, oncology  Consulted for recommendations.  After further discussions with family , GI, daughter and patient wanted to transition to comfort measures. Discussed with palliative, TOC consulted for hospice and discontinue the biopsy and other invasive tests.  She underwent IVC filter placement  on 11/08.  Plan is for discharge to [facility] tomorrow or sooner when bed available.   Assessment & Plan:   Principal Problem:   Hyperbilirubinemia Active Problems:   Type II diabetes mellitus with manifestations (HCC)   Hyperlipidemia with target LDL less than 100   HTN (hypertension)   AKI (acute kidney injury) (HCC)   Normocytic anemia   Stage 3b chronic kidney disease (CKD) (HCC)   Hyponatremia   Pancreatic mass   DVT, bilateral lower limbs (HCC)  Pancreatic mass with hyperbilirubinemia: Biliary obstruction, peritoneal carcinomatosis and malignant ascites GI was consulted, patient may require ERCP and biopsy. IR was consulted for diagnostic paracentesis with cytology and possible omental biopsy  Continue adequate pain control. Palliative care consulted for goals of  care discussion.  After further discussions with family, GI, daughter , Care transitioned to  full comfort measures.  Discussed with palliative, discontinue the biopsy and other invasive tests. Plan: Care transitioned to full comfort care. Anticipated discharge to [facility] tomorrow or sooner when bed available.   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    Bilateral DVT and Suspected PE: On Heparin initially, s/p IVC filter placement by IR 10/11/22. Care transitioned to full comfort care.  AKI on  CKD stage IIIa Creatinine at baseline between 1.4 to 1.6.  Continue IV hydration.  Renal functions improving. Care transitioned to full comfort care.   Hypertension:  Blood pressure remains controlled.   Type 2 DM:  FS remains  controlled.   Hyperlipidemia:  Care transitioned to comfort care.   Anemia of chronic disease:  Transfuse to keep hemoglobin greater than 7.    Leukocytosis:  Suspect reactive.  Empirically  on IV antibiotics which were discontinued.    Anemia of chronic disease/ anemia of malignancy;  Hemoglobin 7.5 S/p 1 unit of prbc transfusion.   DVT prophylaxis: Status post IVC filter. Code Status: DNR Family Communication: No family at bedside Disposition Plan:   Status is: Inpatient Remains inpatient appropriate because: Admitted for pancreatic mass causing biliary obstruction, also found to have bilateral DVT and PE underwent IVC filter placement.  Plan is to discharge to [facility] tomorrow.  Care transitioned to full comfort care   Consultants:  Oncology Palliative care  Procedures: IVC filter placement Antimicrobials:  Anti-infectives (From admission, onward)    Start     Dose/Rate Route Frequency Ordered Stop   10/07/22 2000  piperacillin-tazobactam (ZOSYN) IVPB 3.375 g  Status:  Discontinued        3.375 g 12.5 mL/hr over 240 Minutes Intravenous Every 8 hours 10/07/22 1856 10/09/22 1708        Subjective: Patient was seen and examined at bedside.  Overnight events noted. Patient lies comfortably on the bed, reports pain is well controlled. She appears less yellow, awaiting transfer to [place] for comfort care.  Objective: Vitals:   10/13/22 0558 10/13/22 0827 10/13/22 1716 10/14/22 0501  BP: (!) 135/45 (!) 127/53 (!) 122/51 (!) 116/45  Pulse: (!) 105 (!) 107 (!) 108 (!) 105  Resp: '16 16 17 18  '$ Temp:  97.8 F (36.6 C) 98 F (36.7 C) 98.2 F (36.8 C)  TempSrc:    Oral  SpO2: 99% 97% 98% 96%  Weight:      Height:        Intake/Output Summary (Last 24 hours) at 10/14/2022 1011 Last data filed at 10/14/2022 0501 Gross per 24 hour  Intake 360 ml  Output 200 ml  Net 160 ml   Filed Weights   10/07/22 0951  Weight: 77.1 kg    Examination:  General exam: Appears deconditioned, chronically ill looking, appears yellow, not in any distress. Respiratory system: CTA bilaterally, respiratory effort normal, RR 14. Cardiovascular system: S1 & S2 heard, regular rate and rhythm, no murmur. Gastrointestinal system: Abdomen is soft, non tender, non distended, BS+ Central nervous system: Alert and oriented x 3. No focal neurological deficits. Extremities: No edema, no cyanosis, no clubbing. Skin: No rashes, lesions or ulcers Psychiatry: Judgement and insight appear normal. Mood & affect appropriate.     Data Reviewed: I have personally reviewed following labs and imaging studies  CBC: Recent Labs  Lab 10/07/22 2331 10/08/22 0445 10/08/22 0957 10/08/22 1845 10/09/22 0742 10/09/22 0800 10/09/22 2102 10/10/22 0552 10/11/22 0530  WBC 14.9*  --  13.7*  --   --  10.6*  --  11.8* 13.8*  HGB 7.6*   < > 8.0*  --   --  6.4* 7.0* 6.9* 7.5*  HCT 24.0*   < > 25.4*  --   --  20.2* 22.3* 22.2* 24.3*  MCV 91.6  --  92.4  --   --  92.2  --  93.7 94.9  PLT 206  --  224 215 204 209  --  229 276   < > = values in this interval not displayed.   Basic Metabolic Panel: Recent  Labs  Lab 10/07/22 2331 10/09/22 0742 10/10/22 0552 10/11/22 0530  NA 135 135 134* 136  K 4.6 4.2 4.2 4.2  CL 108 106 106 104  CO2 18* 21* 21* 22  GLUCOSE 128* 188* 168* 239*  BUN 31* 28* 24* 27*  CREATININE 2.04* 1.85* 1.74* 1.95*  CALCIUM 7.7* 7.9* 7.9* 8.2*   GFR: Estimated Creatinine Clearance: 23.6 mL/min (A) (by C-G formula based on SCr of 1.95 mg/dL (H)). Liver Function Tests: Recent Labs  Lab 10/07/22 2331 10/09/22 0742 10/10/22 0552 10/11/22 0530  AST 196* 130* 131* 130*  ALT 104* 78* 75* 84*  ALKPHOS 3,286* 2,067* 1,865* 1,999*  BILITOT 8.0* 8.7* 9.7* 9.9*  PROT 4.4* 4.8* 4.7* 4.7*  ALBUMIN 1.9* 2.2* 2.1* 2.0*   No results for input(s): "LIPASE", "AMYLASE" in the last 168 hours.  No results for input(s): "AMMONIA" in the last 168 hours. Coagulation Profile: Recent Labs  Lab 10/07/22 1345 10/07/22 2331 10/08/22 0957 10/08/22 1845 10/09/22 0742  INR 4.0* 7.2* 7.5* 6.4* 1.7*   Cardiac Enzymes: No results  for input(s): "CKTOTAL", "CKMB", "CKMBINDEX", "TROPONINI" in the last 168 hours. BNP (last 3 results) No results for input(s): "PROBNP" in the last 8760 hours. HbA1C: No results for input(s): "HGBA1C" in the last 72 hours. CBG: Recent Labs  Lab 10/08/22 1155 10/08/22 1651 10/08/22 2010 10/09/22 0717 10/09/22 1144  GLUCAP 89 186* 246* 150* 152*   Lipid Profile: No results for input(s): "CHOL", "HDL", "LDLCALC", "TRIG", "CHOLHDL", "LDLDIRECT" in the last 72 hours. Thyroid Function Tests: No results for input(s): "TSH", "T4TOTAL", "FREET4", "T3FREE", "THYROIDAB" in the last 72 hours. Anemia Panel: No results for input(s): "VITAMINB12", "FOLATE", "FERRITIN", "TIBC", "IRON", "RETICCTPCT" in the last 72 hours. Sepsis Labs: Recent Labs  Lab 10/07/22 1221  LATICACIDVEN 1.2    No results found for this or any previous visit (from the past 240 hour(s)).   Radiology Studies: No results found.  Scheduled Meds:  sodium chloride   Intravenous  Once   Continuous Infusions:   LOS: 7 days    Time spent: 35 mins    Iara Monds, MD [facility]   If 7PM-7AM, please contact night-coverage

## 2022-10-14 NOTE — TOC Progression Note (Addendum)
Transition of Care [facility]) - Progression Note    Patient Details  Name: Beth Garrison MRN: [id number] Date of Birth: Sep 06, 1941  Transition of [facility]) CM/SW Contact  Henrietta Dine, RN Phone Number: 10/14/2022, 9:28 AM  Clinical Narrative:    Spoke with Venia Carbon, [facility] Liaison; she says there is no bed available today; MD notified.  Expected Discharge Plan: [place] Barriers to Discharge: Continued Medical Work up  Expected Discharge Plan and Services Expected Discharge Plan: [place]   Discharge Planning Services: CM Consult Post Acute Care Choice: [place] arrangements for the past 2 months: Single Family Home                                       Social Determinants of Health (SDOH) Interventions    Readmission Risk Interventions     No data to display

## 2022-10-14 NOTE — Progress Notes (Signed)
Manufacturing engineer [facility]) Hospital Liaison note   Update on this [place] referral: Unfortunately, [facility] is unable to offer a  bed today. Hospital Liaison will follow up tomorrow or sooner if a room becomes available.    Please do not hesitate to call with questions.    Thank you,  Venia Carbon DNP, RN [facility] Liaison

## 2022-10-15 NOTE — TOC Progression Note (Addendum)
Transition of Care [facility]) - Progression Note    Patient Details  Name: Beth Garrison MRN: [id number] Date of Birth: 1941-09-05  Transition of Care [facility]) CM/SW Contact  Henrietta Dine, RN Phone Number: 10/15/2022, 9:33 AM  Clinical Narrative:    Attempted to find out status of bed at [facility]; LVM for [facility] Liaison at  [phone]; awaiting return call.  [phone]- Notified by Venia Carbon, [place] Liaison, no beds available today; MD notified.   Expected Discharge Plan: [place] Barriers to Discharge: Continued Medical Work up  Expected Discharge Plan and Services Expected Discharge Plan: [place]   Discharge Planning Services: CM Consult Post Acute Care Choice: [place] arrangements for the past 2 months: Single Family Home                                       Social Determinants of Health (SDOH) Interventions    Readmission Risk Interventions     No data to display

## 2022-10-15 NOTE — Progress Notes (Signed)
PROGRESS NOTE    Beth Garrison  RWE:[id number] DOB: 01/19/1941 DOA: 10/07/2022  PCP: Mindi Curling, PA-C  Brief Narrative: This 81 y.o. female with PMH significant of  CKD, stage IIIa , hyperkalemia, history of melena, history of sepsis, type II DM, GERD, hyperlipidemia, hypertension, intractable nausea and vomiting, unspecified anemia who is brought via EMS due to abdominal pain, decreased appetite, nausea, weight loss and generalized weakness for the past 2 weeks.  CT abdomen/pelvis with bilateral lower extremity DVT and suspicion for right lower lobe segmental pulmonary emboli.  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  Ill-defined mass in the uncinate process of the pancreas.  Associated pancreatic ductal dilatation, diffuse pancreatic atrophy and significant dilatation of the CBD and gallbladder. GI and IR, oncology  Consulted for recommendations.  After further discussions with family , GI, daughter and patient wanted to transition to comfort measures. Discussed with palliative, TOC consulted for hospice and discontinue the biopsy and other invasive tests.  She underwent IVC filter placement  on 11/08.  Plan is for discharge to [facility] tomorrow or sooner when bed available.   Assessment & Plan:   Principal Problem:   Hyperbilirubinemia Active Problems:   Type II diabetes mellitus with manifestations (HCC)   Hyperlipidemia with target LDL less than 100   HTN (hypertension)   AKI (acute kidney injury) (HCC)   Normocytic anemia   Stage 3b chronic kidney disease (CKD) (HCC)   Hyponatremia   Pancreatic mass   DVT, bilateral lower limbs (HCC)  Pancreatic mass with hyperbilirubinemia: Biliary obstruction, peritoneal carcinomatosis and malignant ascites GI was consulted, patient may require ERCP and biopsy. IR was consulted for diagnostic paracentesis with cytology and possible omental biopsy  Continue adequate pain control. Palliative care consulted for goals of  care discussion.  After further discussions with family, GI, daughter , Care transitioned to  full comfort measures.  Discussed with palliative, discontinue the biopsy and other invasive tests. Plan: Care transitioned to full comfort care. Anticipated discharge to [facility] tomorrow or sooner when bed available.   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    Bilateral DVT and Suspected PE: On Heparin initially, s/p IVC filter placement by IR 10/11/22. Care transitioned to full comfort care.  AKI on  CKD stage IIIa Creatinine at baseline between 1.4 to 1.6.  Continue IV hydration.  Renal functions improving. Care transitioned to full comfort care.   Hypertension:  Blood pressure remains controlled.   Type 2 DM:  FS remains  controlled.   Hyperlipidemia:  Care transitioned to comfort care.   Anemia of chronic disease:  Transfuse to keep hemoglobin greater than 7.    Leukocytosis:  Suspect reactive.  Empirically  on IV antibiotics which were discontinued.    Anemia of chronic disease/ anemia of malignancy;  Hemoglobin 7.5 S/p 1 unit of prbc transfusion.   DVT prophylaxis: Status post IVC filter. Code Status: DNR Family Communication: No family at bedside Disposition Plan:   Status is: Inpatient Remains inpatient appropriate because: Admitted for pancreatic mass causing biliary obstruction, also found to have bilateral DVT and PE underwent IVC filter placement.  Plan is to discharge to [facility] tomorrow or sooner when bed available.Care transitioned to full comfort care   Consultants:  Oncology Palliative care  Procedures: IVC filter placement Antimicrobials:  Anti-infectives (From admission, onward)    Start     Dose/Rate Route Frequency Ordered Stop   10/07/22 2000  piperacillin-tazobactam (ZOSYN) IVPB 3.375 g  Status:  Discontinued        3.375 g 12.5 mL/hr over 240 Minutes Intravenous Every 8 hours 10/07/22 1856 10/09/22  1708       Subjective: Patient was seen and examined at bedside.  Overnight events noted. Patient lies comfortably in the bed.  She reports pain is well controlled. She is awaiting transfer to [place] for comfort care.  Objective: Vitals:   10/13/22 0827 10/13/22 1716 10/14/22 0501 10/15/22 0507  BP: (!) 127/53 (!) 122/51 (!) 116/45 (!) 123/55  Pulse: (!) 107 (!) 108 (!) 105 95  Resp: '16 17 18 16  '$ Temp: 97.8 F (36.6 C) 98 F (36.7 C) 98.2 F (36.8 C) 97.7 F (36.5 C)  TempSrc:   Oral Oral  SpO2: 97% 98% 96% 96%  Weight:      Height:        Intake/Output Summary (Last 24 hours) at 10/15/2022 1008 Last data filed at 10/15/2022 9476 Gross per 24 hour  Intake 500 ml  Output 110 ml  Net 390 ml   Filed Weights   10/07/22 0951  Weight: 77.1 kg   Examination:  General exam: Appears deconditioned, chronically ill looking, comfortable,  not in any distress. Respiratory system: CTA bilaterally, respiratory effort normal, RR 14. Cardiovascular system: S1 & S2 heard, regular rate and rhythm, no murmur. Gastrointestinal system: Abdomen is soft, non tender, non distended, BS+ Central nervous system: Alert and oriented x 3, no focal neurological deficits. Extremities: No edema, no cyanosis, no clubbing. Skin: No rashes, lesions or ulcers Psychiatry: Judgement and insight appear normal. Mood & affect appropriate.     Data Reviewed: I have personally reviewed following labs and imaging studies  CBC: Recent Labs  Lab 10/08/22 1845 10/09/22 0742 10/09/22 0800 10/09/22 2102 10/10/22 0552 10/11/22 0530  WBC  --   --  10.6*  --  11.8* 13.8*  HGB  --   --  6.4* 7.0* 6.9* 7.5*  HCT  --   --  20.2* 22.3* 22.2* 24.3*  MCV  --   --  92.2  --  93.7 94.9  PLT 215 204 209  --  229 546   Basic Metabolic Panel: Recent Labs  Lab 10/09/22 0742 10/10/22 0552 10/11/22 0530  NA 135 134* 136  K 4.2 4.2 4.2  CL 106 106 104  CO2 21* 21* 22  GLUCOSE 188* 168* 239*  BUN 28*  24* 27*  CREATININE 1.85* 1.74* 1.95*  CALCIUM 7.9* 7.9* 8.2*   GFR: Estimated Creatinine Clearance: 23.6 mL/min (A) (by C-G formula based on SCr of 1.95 mg/dL (H)). Liver Function Tests: Recent Labs  Lab 10/09/22 0742 10/10/22 0552 10/11/22 0530  AST 130* 131* 130*  ALT 78* 75* 84*  ALKPHOS 2,067* 1,865* 1,999*  BILITOT 8.7* 9.7* 9.9*  PROT 4.8* 4.7* 4.7*  ALBUMIN 2.2* 2.1* 2.0*   No results for input(s): "LIPASE", "AMYLASE" in the last 168 hours.  No results for input(s): "AMMONIA" in the last 168 hours. Coagulation Profile: Recent Labs  Lab 10/08/22 1845 10/09/22 0742  INR 6.4* 1.7*   Cardiac Enzymes: No results for input(s): "CKTOTAL", "CKMB", "CKMBINDEX", "TROPONINI" in the last 168 hours. BNP (last 3 results) No results for input(s): "PROBNP" in the last 8760 hours. HbA1C: No results for input(s): "HGBA1C" in the last 72 hours. CBG: Recent Labs  Lab 10/08/22 1155 10/08/22 1651 10/08/22 2010 10/09/22 0717 10/09/22 1144  GLUCAP 89 186* 246* 150* 152*   Lipid Profile: No results for input(s): "CHOL", "HDL", "  [place]", "TRIG", "CHOLHDL", "LDLDIRECT" in the last 72 hours. Thyroid Function Tests: No results for input(s): "TSH", "T4TOTAL", "FREET4", "T3FREE", "THYROIDAB" in the last 72 hours. Anemia Panel: No results for input(s): "VITAMINB12", "FOLATE", "FERRITIN", "TIBC", "IRON", "RETICCTPCT" in the last 72 hours. Sepsis Labs: No results for input(s): "PROCALCITON", "LATICACIDVEN" in the last 168 hours.   No results found for this or any previous visit (from the past 240 hour(s)).   Radiology Studies: No results found.  Scheduled Meds:  sodium chloride   Intravenous Once   Continuous Infusions:   LOS: 8 days    Time spent: 25 mins    Jaber Dunlow, MD [facility]   If 7PM-7AM, please contact night-coverage

## 2022-10-15 NOTE — Progress Notes (Signed)
Manufacturing engineer [facility]) Hospital Liaison note   Update on this [place] referral: Unfortunately, [facility] is unable to offer a  bed today. Hospital Liaison will follow up tomorrow or sooner if a room becomes available.    Please do not hesitate to call with questions.    Thank you,  Venia Carbon DNP, RN [facility]

## 2022-10-16 NOTE — Progress Notes (Signed)
Met with patient to provide support. Patient was attempting to eat assisted as much as she would allow, made sure t.v. 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 Will follow up.

## 2022-10-16 NOTE — Progress Notes (Signed)
Beth Garrison is still awaiting a bed at [facility].  I am just surprised is taking so long to get a bed for her.  Her decline continues.  I am unsure if she really is eating much.  Does not look like she is having a lot of pain.  There is been no bleeding.  She does have the IVC filter and because of the blood clots in her leg.  She has had no cough or shortness of breath.  Her vital signs are temperature 97.8.  Pulse 95.  Blood pressure 106/58.  Her lungs sound pretty clear bilaterally.  She has good air movement bilaterally.  Cardiac exam regular rate and rhythm.  Abdomen is soft.  Bowel sounds are somewhat decreased.  There is no obvious fluid wave.  Extremities does show some mild chronic edema in the lower legs.  We are are basically waiting for [name] to go to [facility].  Hopefully, a bed will open up for her soon.  She appears to be comfortable.  I do appreciate the great care that she is getting from everybody on 3 E.   Lattie Haw, MD  2 Cor 3:17

## 2022-10-16 NOTE — Progress Notes (Signed)
  Daily Progress Note   Patient Name: Beth Garrison       Date: 10/16/2022 DOB: 19-Jul-1941  Age: 81 y.o. MRN#: [id number] Attending Physician: Shawna Clamp, MD Primary Care Physician: Juanda Chance Admit Date: 10/07/2022 Length of Stay: 9 days  Patient being followed by [facility] hospice liaison, awaiting admission to [facility]. Symptoms currently managed with available medications and goals remain focused on comfort focused care at the end of life. Palliative care team will sign off at this time as symptoms managed and goals determined. Please reach out if our team can be of further assistance in the future. Thank you for involving our team in patient's care.    Chelsea Aus, DO Palliative Care Provider PMT # [phone]

## 2022-10-16 NOTE — Progress Notes (Signed)
Nutrition Follow-up  DOCUMENTATION CODES:   Non-severe (moderate) malnutrition in context of chronic illness  INTERVENTION:  -Provide nutrition interventions consistent with pt's POC goals  NUTRITION DIAGNOSIS:  Moderate Malnutrition related to chronic illness as evidenced by percent weight loss, energy intake < or equal to 75% for > or equal to 1 month, moderate fat depletion, moderate muscle depletion.  GOAL:  Patient will meet greater than or equal to 90% of their needs  MONITOR:  PO intake, Supplement acceptance, Diet advancement  REASON FOR ASSESSMENT:  F/U Malnutrition Screening Tool    ASSESSMENT:  Pt is an 81yo F with PMH of AKI, stage 3a CKD, hyperkalemia, melena, sepsis, type II DM, type 2 diabetes, GERD, HLD, HTN, intractable nausea and vomiting, and unspecified anemia who presents with abdominal pain, decreased appetite, nausea, weight loss and generalized weakness for the past 2 weeks.  ONS and aggressive treatments have been discontinued. Plan for pt to be discharged to hospice facility when bed available. Pt is on very restrictive diet, consider liberalizing for comfort as desired. Recorded intake of 0-25% meals. RD will sign off, available for consult as needed.   Diet Order:   Diet Order             Diet - low sodium heart healthy           DIET DYS 3 Room service appropriate? Yes; Fluid consistency: Thin  Diet effective now           Diet - low sodium heart healthy           Diet Carb Modified                   EDUCATION NEEDS:  Not appropriate for education at this time  Skin:  Skin Assessment: Skin Integrity Issues: Skin Integrity Issues:: Unstageable Unstageable: bilateral heels  Height:  Ht Readings from Last 1 Encounters:  10/07/22 '5\' 5"'$  (1.651 m)   Weight:  Wt Readings from Last 1 Encounters:  10/07/22 77.1 kg    BMI:  Body mass index is 28.29 kg/m.  Estimated Nutritional Needs:  Kcal:  2150-2350kcal Protein:  75-115g Fluid:   >1960m  KCandise Bowens MS, RD, LDN, CNSC See AMiON for contact information

## 2022-10-16 NOTE — Progress Notes (Signed)
PROGRESS NOTE    Beth Garrison  JOA:[id number] DOB: Mar 25, 1941 DOA: 10/07/2022  PCP: Mindi Curling, PA-C  Brief Narrative: This 81 y.o. female with PMH significant of  CKD, stage IIIa , hyperkalemia, history of melena, history of sepsis, type II DM, GERD, hyperlipidemia, hypertension, intractable nausea and vomiting, unspecified anemia who is brought via EMS due to abdominal pain, decreased appetite, nausea, weight loss and generalized weakness for the past 2 weeks.  CT abdomen/pelvis with bilateral lower extremity DVT and suspicion for right lower lobe segmental pulmonary emboli.  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  Ill-defined mass in the uncinate process of the pancreas.  Associated pancreatic ductal dilatation, diffuse pancreatic atrophy and significant dilatation of the CBD and gallbladder. GI and IR, oncology  Consulted for recommendations.  After further discussions with family , GI, daughter and patient wanted to transition to comfort measures. Discussed with palliative, TOC consulted for hospice and discontinue the biopsy and other invasive tests.  She underwent IVC filter placement  on 11/08.  Plan is for discharge to [facility] tomorrow or sooner when bed available.   Assessment & Plan:   Principal Problem:   Hyperbilirubinemia Active Problems:   Type II diabetes mellitus with manifestations (HCC)   Hyperlipidemia with target LDL less than 100   HTN (hypertension)   AKI (acute kidney injury) (HCC)   Normocytic anemia   Stage 3b chronic kidney disease (CKD) (HCC)   Hyponatremia   Pancreatic mass   DVT, bilateral lower limbs (HCC)  Pancreatic mass with hyperbilirubinemia: Biliary obstruction, peritoneal carcinomatosis and malignant ascites GI was consulted, patient may require ERCP and biopsy. IR was consulted for diagnostic paracentesis with cytology and possible omental biopsy  Continue adequate pain control. Palliative care consulted for goals of  care discussion.  After further discussions with family, GI, daughter , Care transitioned to  full comfort measures.  Discussed with palliative, discontinue the biopsy and other invasive tests. Plan: Care transitioned to full comfort care. Anticipated discharge to [facility] tomorrow or sooner when bed available.   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    Bilateral DVT and Suspected PE: On Heparin initially, s/p IVC filter placement by IR 10/11/22. Care transitioned to full comfort care.  AKI on  CKD stage IIIa Creatinine at baseline between 1.4 to 1.6.  Continue IV hydration.  Renal functions improving. Care transitioned to full comfort care.   Hypertension:  Blood pressure remains controlled.   Type 2 DM:  FS remains  controlled.   Hyperlipidemia:  Care transitioned to comfort care.   Anemia of chronic disease:  Transfuse to keep hemoglobin greater than 7.    Leukocytosis:  Suspect reactive.  Empirically  on IV antibiotics which were discontinued.    Anemia of chronic disease/ anemia of malignancy;  Hemoglobin 7.5 S/p 1 unit of prbc transfusion.   DVT prophylaxis: Status post IVC filter. Code Status: DNR Family Communication: No family at bedside Disposition Plan:   Status is: Inpatient Remains inpatient appropriate because: Admitted for pancreatic mass causing biliary obstruction, also found to have bilateral DVT and PE underwent IVC filter placement.  Plan is to discharge to [facility] tomorrow or sooner when bed available.Care transitioned to full comfort care   Consultants:  Oncology Palliative care  Procedures: IVC filter placement Antimicrobials:  Anti-infectives (From admission, onward)    Start     Dose/Rate Route Frequency Ordered Stop   10/07/22 2000  piperacillin-tazobactam (ZOSYN) IVPB 3.375 g  Status:  Discontinued        3.375 g 12.5 mL/hr over 240 Minutes Intravenous Every 8 hours 10/07/22 1856 10/09/22  1708       Subjective: Patient was seen and examined at bedside.  Overnight events noted. Patient lies comfortably in the bed.  She reports pain is much improved. She is awaiting transfer to [facility] for comfort care.  Objective: Vitals:   10/14/22 0501 10/15/22 0507 10/15/22 1313 10/16/22 0424  BP: (!) 116/45 (!) 123/55 102/67 (!) 106/58  Pulse: (!) 105 95 (!) 105 95  Resp: '18 16 14 16  '$ Temp: 98.2 F (36.8 C) 97.7 F (36.5 C) 98 F (36.7 C) 97.8 F (36.6 C)  TempSrc: Oral Oral  Oral  SpO2: 96% 96% 98% 98%  Weight:      Height:        Intake/Output Summary (Last 24 hours) at 10/16/2022 1126 Last data filed at 10/16/2022 1000 Gross per 24 hour  Intake 330 ml  Output 505 ml  Net -175 ml   Filed Weights   10/07/22 0951  Weight: 77.1 kg   Examination:  General exam: Appears deconditioned, chronically ill looking, very comfortable. Respiratory system: CTA bilaterally, respiratory effort normal, RR 14. Cardiovascular system: S1-S2 heard, regular rate and rhythm, no murmur. Gastrointestinal system: Abdomen is soft, non tender, non distended, BS+ Central nervous system: Alert and oriented x3, no focal neurological deficits. Extremities: No edema, no cyanosis, no clubbing. Skin: No rashes, lesions or ulcers Psychiatry: Judgement and insight appear normal. Mood & affect appropriate.     Data Reviewed: I have personally reviewed following labs and imaging studies  CBC: Recent Labs  Lab 10/09/22 2102 10/10/22 0552 10/11/22 0530  WBC  --  11.8* 13.8*  HGB 7.0* 6.9* 7.5*  HCT 22.3* 22.2* 24.3*  MCV  --  93.7 94.9  PLT  --  229 027   Basic Metabolic Panel: Recent Labs  Lab 10/10/22 0552 10/11/22 0530  NA 134* 136  K 4.2 4.2  CL 106 104  CO2 21* 22  GLUCOSE 168* 239*  BUN 24* 27*  CREATININE 1.74* 1.95*  CALCIUM 7.9* 8.2*   GFR: Estimated Creatinine Clearance: 23.6 mL/min (A) (by C-G formula based on SCr of 1.95 mg/dL (H)). Liver Function  Tests: Recent Labs  Lab 10/10/22 0552 10/11/22 0530  AST 131* 130*  ALT 75* 84*  ALKPHOS 1,865* 1,999*  BILITOT 9.7* 9.9*  PROT 4.7* 4.7*  ALBUMIN 2.1* 2.0*   No results for input(s): "LIPASE", "AMYLASE" in the last 168 hours.  No results for input(s): "AMMONIA" in the last 168 hours. Coagulation Profile: No results for input(s): "INR", "PROTIME" in the last 168 hours.  Cardiac Enzymes: No results for input(s): "CKTOTAL", "CKMB", "CKMBINDEX", "TROPONINI" in the last 168 hours. BNP (last 3 results) No results for input(s): "PROBNP" in the last 8760 hours. HbA1C: No results for input(s): "HGBA1C" in the last 72 hours. CBG: Recent Labs  Lab 10/09/22 1144  GLUCAP 152*   Lipid Profile: No results for input(s): "CHOL", "HDL", "LDLCALC", "TRIG", "CHOLHDL", "LDLDIRECT" in the last 72 hours. Thyroid Function Tests: No results for input(s): "TSH", "T4TOTAL", "FREET4", "T3FREE", "THYROIDAB" in the last 72 hours. Anemia Panel: No results for input(s): "VITAMINB12", "FOLATE", "FERRITIN", "TIBC", "IRON", "RETICCTPCT" in the last 72 hours. Sepsis Labs: No results for input(s): "PROCALCITON", "LATICACIDVEN" in the last 168 hours.   No results found for this or any previous visit (from the past 240 hour(s)).   Radiology Studies: No results  found.  Scheduled Meds:  sodium chloride   Intravenous Once   Continuous Infusions:   LOS: 9 days    Time spent: 25 mins    Atlantis Delong, MD [facility]   If 7PM-7AM, please contact night-coverage

## 2022-10-16 NOTE — Progress Notes (Signed)
Dr. Alcario Drought in here, and has signed the DNR form. Patient is transferred out of the hospital and she's acompanied by 2 [facility] staff with no distress or any s/s of pain.

## 2022-10-16 NOTE — Progress Notes (Signed)
The patient is leaving to go to hospice house and we're missing her DNR form. The [facility] said they cannot transport her without it. Caprice Renshaw NP and he stated he cannot sign a DNR and has notified Dr. Alcario Drought. Awaiting for Dr. Juleen China response.

## 2022-10-16 NOTE — TOC Transition Note (Signed)
Transition of Care [facility]) - CM/SW Discharge Note   Patient Details  Name: Beth Garrison MRN: [id number] Date of Birth: 1941/10/30  [facility] CM/SW Contact:  Lennart Pall, LCSW Phone Number: 10/16/2022, 3:19 PM   Clinical Narrative:    Bed available today with [facility] and pt is medically cleared for dc.  Family aware and has completed paperwork.  [facility] called at 3:20pm.  RN has called report.  No further TOC needs.   Final next level of care: [place] Barriers to Discharge: Barriers Resolved   Patient Goals and CMS Choice Patient states their goals for this hospitalization and ongoing recovery are::  (Home) CMS Medicare.gov Compare Post Acute Care list provided to:: Patient Represenative (must comment) (Michelle(dtr)) Choice offered to / list presented to : Adult Children  Discharge Placement                Patient to be transferred to facility by: [facility]      Discharge Plan and Services   Discharge Planning Services: CM Consult Post Acute Care Choice: Home Health          DME Arranged: N/A DME Agency: NA                  Social Determinants of Health (SDOH) Interventions     Readmission Risk Interventions     No data to display

## 2022-10-16 NOTE — Progress Notes (Signed)
[facility] [address] [employer] [facility]) Hospital Liaison Note:   Patient has been offered a bed at [facility].  Daughter, Sharyn Lull, will sign consents. Transport via EMS.    TOC and MD aware.    RN please call report to [phone].  IVs can stay in place.  Please send completed and signed DNR.  Thank you,  Kenna Gilbert BSN RN  [facility] Liaison  [phone]

## 2022-10-18 ENCOUNTER — Telehealth: Payer: Self-pay

## 2022-10-18 NOTE — Telephone Encounter (Signed)
Received call from [facility] at [facility] reporting that patient passed away today 10/21/20 at 1522. Flowers ordered per Dr Marin Olp. dph

## 2022-11-03 DEATH — deceased
